# Patient Record
Sex: Female | Born: 1990 | Race: Black or African American | Hispanic: No | Marital: Single | Smoking: Former smoker
Health system: Southern US, Community
[De-identification: ages and names within clinical notes are randomized; demographics above are authoritative.]

## PROBLEM LIST (undated history)

## (undated) ENCOUNTER — Emergency Department (HOSPITAL_BASED_OUTPATIENT_CLINIC_OR_DEPARTMENT_OTHER): Payer: Medicaid Other | Source: Home / Self Care

## (undated) DIAGNOSIS — Z8759 Personal history of other complications of pregnancy, childbirth and the puerperium: Secondary | ICD-10-CM

## (undated) DIAGNOSIS — E119 Type 2 diabetes mellitus without complications: Secondary | ICD-10-CM

## (undated) DIAGNOSIS — D649 Anemia, unspecified: Secondary | ICD-10-CM

## (undated) DIAGNOSIS — Z9289 Personal history of other medical treatment: Secondary | ICD-10-CM

## (undated) DIAGNOSIS — K219 Gastro-esophageal reflux disease without esophagitis: Secondary | ICD-10-CM

## (undated) HISTORY — PX: LAPAROSCOPY FOR ECTOPIC PREGNANCY: SUR765

---

## 2012-04-22 ENCOUNTER — Encounter (HOSPITAL_COMMUNITY): Payer: Self-pay

## 2012-04-22 ENCOUNTER — Inpatient Hospital Stay (HOSPITAL_COMMUNITY)
Admission: AD | Admit: 2012-04-22 | Discharge: 2012-04-25 | DRG: 765 | Disposition: A | Discharge status: Home / Self Care | Payer: Medicaid Other | Source: Ambulatory Visit | Attending: Obstetrics & Gynecology | Admitting: Obstetrics & Gynecology

## 2012-04-22 ENCOUNTER — Inpatient Hospital Stay (HOSPITAL_COMMUNITY): Payer: Medicaid Other

## 2012-04-22 DIAGNOSIS — D509 Iron deficiency anemia, unspecified: Secondary | ICD-10-CM | POA: Diagnosis present

## 2012-04-22 DIAGNOSIS — O4290 Premature rupture of membranes, unspecified as to length of time between rupture and onset of labor, unspecified weeks of gestation: Secondary | ICD-10-CM | POA: Diagnosis present

## 2012-04-22 DIAGNOSIS — O411090 Infection of amniotic sac and membranes, unspecified, unspecified trimester, not applicable or unspecified: Secondary | ICD-10-CM | POA: Diagnosis present

## 2012-04-22 DIAGNOSIS — O34219 Maternal care for unspecified type scar from previous cesarean delivery: Secondary | ICD-10-CM | POA: Diagnosis present

## 2012-04-22 DIAGNOSIS — O0930 Supervision of pregnancy with insufficient antenatal care, unspecified trimester: Secondary | ICD-10-CM

## 2012-04-22 DIAGNOSIS — O9902 Anemia complicating childbirth: Secondary | ICD-10-CM | POA: Diagnosis present

## 2012-04-22 HISTORY — DX: Personal history of other medical treatment: Z92.89

## 2012-04-22 HISTORY — DX: Anemia, unspecified: D64.9

## 2012-04-22 HISTORY — DX: Personal history of other complications of pregnancy, childbirth and the puerperium: Z87.59

## 2012-04-22 LAB — FIBRINOGEN: Fibrinogen: 715 mg/dL — ABNORMAL HIGH (ref 204–475)

## 2012-04-22 LAB — CBC
Hemoglobin: 9.7 g/dL — ABNORMAL LOW (ref 12.0–15.0)
MCHC: 30.4 g/dL (ref 30.0–36.0)
Platelets: 161 10*3/uL (ref 150–400)
RBC: 4.23 MIL/uL (ref 3.87–5.11)

## 2012-04-22 LAB — DIFFERENTIAL
Basophils Relative: 0 % (ref 0–1)
Monocytes Relative: 9 % (ref 3–12)
Neutro Abs: 4.9 10*3/uL (ref 1.7–7.7)
Neutrophils Relative %: 66 % (ref 43–77)

## 2012-04-22 LAB — HEPATITIS B SURFACE ANTIGEN: Hepatitis B Surface Ag: NEGATIVE

## 2012-04-22 LAB — ABO/RH: ABO/RH(D): A POS

## 2012-04-22 LAB — RAPID HIV SCREEN (WH-MAU): Rapid HIV Screen: NONREACTIVE

## 2012-04-22 LAB — RPR: RPR Ser Ql: NONREACTIVE

## 2012-04-22 LAB — PROTIME-INR: Prothrombin Time: 13.3 seconds (ref 11.6–15.2)

## 2012-04-22 LAB — RUBELLA SCREEN: Rubella: 64.4 IU/mL — ABNORMAL HIGH

## 2012-04-22 MED ORDER — LACTATED RINGERS IV SOLN
500.0000 mL | INTRAVENOUS | Status: DC | PRN
  Administered 2012-04-23: 500 mL via INTRAVENOUS

## 2012-04-22 MED ORDER — OXYCODONE-ACETAMINOPHEN 5-325 MG PO TABS: 1.0000 | ORAL_TABLET | ORAL | Status: DC | PRN

## 2012-04-22 MED ORDER — ONDANSETRON HCL 4 MG/2ML IJ SOLN: 4.0000 mg | Freq: Four times a day (QID) | INTRAMUSCULAR | Status: DC | PRN

## 2012-04-22 MED ORDER — LACTATED RINGERS IV SOLN
500.0000 mL | Freq: Once | INTRAVENOUS | Status: AC
  Administered 2012-04-23: 500 mL via INTRAVENOUS

## 2012-04-22 MED ORDER — OXYTOCIN BOLUS FROM INFUSION
250.0000 mL | Freq: Once | INTRAVENOUS | Status: DC
  Filled 2012-04-22: qty 500

## 2012-04-22 MED ORDER — DIPHENHYDRAMINE HCL 50 MG/ML IJ SOLN: 12.5000 mg | INTRAMUSCULAR | Status: DC | PRN

## 2012-04-22 MED ORDER — ACETAMINOPHEN 325 MG PO TABS
650.0000 mg | ORAL_TABLET | ORAL | Status: DC | PRN
  Administered 2012-04-23: 975 mg via ORAL
  Filled 2012-04-22: qty 2
  Filled 2012-04-22: qty 1

## 2012-04-22 MED ORDER — EPHEDRINE 5 MG/ML INJ
10.0000 mg | INTRAVENOUS | Status: DC | PRN
  Filled 2012-04-22: qty 4

## 2012-04-22 MED ORDER — LIDOCAINE HCL (PF) 1 % IJ SOLN: 30.0000 mL | INTRAMUSCULAR | Status: DC | PRN

## 2012-04-22 MED ORDER — FLEET ENEMA 7-19 GM/118ML RE ENEM: 1.0000 | ENEMA | RECTAL | Status: DC | PRN

## 2012-04-22 MED ORDER — TERBUTALINE SULFATE 1 MG/ML IJ SOLN: 0.2500 mg | Freq: Once | INTRAMUSCULAR | Status: AC | PRN

## 2012-04-22 MED ORDER — FENTANYL CITRATE 0.05 MG/ML IJ SOLN
100.0000 ug | INTRAMUSCULAR | Status: DC | PRN
  Administered 2012-04-22 – 2012-04-23 (×3): 100 ug via INTRAVENOUS
  Filled 2012-04-22 (×3): qty 2

## 2012-04-22 MED ORDER — PHENYLEPHRINE 40 MCG/ML (10ML) SYRINGE FOR IV PUSH (FOR BLOOD PRESSURE SUPPORT): 80.0000 ug | PREFILLED_SYRINGE | INTRAVENOUS | Status: DC | PRN

## 2012-04-22 MED ORDER — EPHEDRINE 5 MG/ML INJ: 10.0000 mg | INTRAVENOUS | Status: DC | PRN

## 2012-04-22 MED ORDER — FENTANYL 2.5 MCG/ML BUPIVACAINE 1/10 % EPIDURAL INFUSION (WH - ANES)
14.0000 mL/h | INTRAMUSCULAR | Status: DC
  Administered 2012-04-23: 12 mL/h via EPIDURAL
  Administered 2012-04-23 (×2): 14 mL/h via EPIDURAL
  Filled 2012-04-22 (×3): qty 60

## 2012-04-22 MED ORDER — OXYTOCIN 40 UNITS IN LACTATED RINGERS INFUSION - SIMPLE MED
1.0000 m[IU]/min | INTRAVENOUS | Status: DC
  Administered 2012-04-22: 2 m[IU]/min via INTRAVENOUS
  Filled 2012-04-22: qty 1000

## 2012-04-22 MED ORDER — OXYTOCIN 40 UNITS IN LACTATED RINGERS INFUSION - SIMPLE MED: 62.5000 mL/h | Freq: Once | INTRAVENOUS | Status: DC

## 2012-04-22 MED ORDER — PHENYLEPHRINE 40 MCG/ML (10ML) SYRINGE FOR IV PUSH (FOR BLOOD PRESSURE SUPPORT)
80.0000 ug | PREFILLED_SYRINGE | INTRAVENOUS | Status: DC | PRN
  Filled 2012-04-22: qty 5

## 2012-04-22 MED ORDER — LACTATED RINGERS IV SOLN
INTRAVENOUS | Status: DC
  Administered 2012-04-22: 125 mL/h via INTRAVENOUS
  Administered 2012-04-23 (×2): via INTRAVENOUS

## 2012-04-22 MED ORDER — IBUPROFEN 600 MG PO TABS: 600.0000 mg | ORAL_TABLET | Freq: Four times a day (QID) | ORAL | Status: DC | PRN

## 2012-04-22 MED ORDER — CITRIC ACID-SODIUM CITRATE 334-500 MG/5ML PO SOLN
30.0000 mL | ORAL | Status: DC | PRN
  Administered 2012-04-23: 30 mL via ORAL
  Filled 2012-04-22: qty 15

## 2012-04-22 NOTE — MAU Note (Signed)
Patient arrived by EMS with complaints of leaking clear fluid starting about 0620. Has had one contractions. Patient has not had any prenatal care. Had a prior cesarean section with her first child for a breech presentation. Reports good fetal movement.

## 2012-04-22 NOTE — Progress Notes (Signed)
Sandra [name] is a 21 y.o. G3P1011 at [redacted]w[redacted]d admitted for rupture of membranes  Subjective: Contractions spaced out and weaker  Objective: BP 124/78  Pulse 105  Temp 98.6 F (37 C) (Oral)  Resp 20  Ht 4\' 11"  (1.499 m)  Wt 213 lb 6.4 oz (96.798 kg)  BMI 43.10 kg/m2  SpO2 100%  LMP 07/30/2011      FHT:  FHR: 145 bpm, variability: moderate,  accelerations:  Present,  decelerations:  Absent UC:   none SVE:   Dilation: 1 Effacement (%): 50 Station: -2 Exam by:: Dr. Elwyn Reach  Labs: Lab Results  Component Value Date   WBC 7.5 04/22/2012   HGB 9.7* 04/22/2012   HCT 31.9* 04/22/2012   MCV 75.4* 04/22/2012   PLT 161 04/22/2012    Assessment / Plan: PROM without labor  Labor: Foley bulb placed Fetal Wellbeing:  Category I Pain Control:  Labor support without medications and IV pain meds available I/D:  GBS unknown Anticipated MOD:  VBAC  BOOTH, [name] 04/22/2012, 2:55 PM

## 2012-04-22 NOTE — Progress Notes (Signed)
Subjective: Patient doing well. No complaints.  Objective: BP 130/58  Pulse 86  Temp 98.5 F (36.9 C) (Oral)  Resp 20  Ht 4\' 11"  (1.499 m)  Wt 96.798 kg (213 lb 6.4 oz)  BMI 43.10 kg/m2  SpO2 100%  LMP 07/30/2011      FHT:  FHR: 145 bpm, variability: moderate,  accelerations:  Present,  decelerations:  Absent UC:   irregular, every 2-6 minutes SVE:   Dilation: 6 Effacement (%): 70 Station: -3 Exam by:: l. leftwich-kirby, cnm  Labs: Lab Results  Component Value Date   WBC 7.5 04/22/2012   HGB 9.7* 04/22/2012   HCT 31.9* 04/22/2012   MCV 75.4* 04/22/2012   PLT 161 04/22/2012    Assessment / Plan: Foley bulb out.  Will start pitocin. Continue to support labor.  Marikay Alar 04/22/2012, 10:24 PM

## 2012-04-22 NOTE — MAU Note (Signed)
Dr. Elwyn Reach notified pt in MAU for c/o lof, clear, noted since 0630, intermittent contractions, efm tracing reactive. Will come see pt in MAU.

## 2012-04-22 NOTE — MAU Note (Signed)
Dr. Elwyn Reach at bedside.

## 2012-04-22 NOTE — MAU Note (Signed)
Pt states lof since 0620, clear fluid, no pnc here thus far. Prior c/s for breech delivery.

## 2012-04-22 NOTE — Progress Notes (Signed)
Sandra [name] is a 21 y.o. G3P1011 at [redacted]w[redacted]d admitted for rupture of membranes  Subjective: Uncomfortable with contractions  Objective: BP 117/61  Pulse 94  Temp 98.7 F (37.1 C) (Oral)  Resp 20  Ht 4\' 11"  (1.499 m)  Wt 213 lb 6.4 oz (96.798 kg)  BMI 43.10 kg/m2  SpO2 100%  LMP 07/30/2011      FHT:  FHR: 140 bpm, variability: moderate,  accelerations:  Present,  decelerations:  Present few variables UC:   regular, every 1-5 minutes SVE:   Dilation:  (unchanged) Effacement (%): 50 Station: -2 Exam by:: Dr. Elwyn Reach  Labs: Lab Results  Component Value Date   WBC 7.5 04/22/2012   HGB 9.7* 04/22/2012   HCT 31.9* 04/22/2012   MCV 75.4* 04/22/2012   PLT 161 04/22/2012    Assessment / Plan: PROM  Labor: Foley still in place; consider starting low dose pitocin if not out in the next few hours Fetal Wellbeing:  Category II Pain Control:  Labor support without medications and IV pain meds available I/D:  GBS unknown Anticipated MOD:  VBAC  BOOTH, Mckyle Solanki 04/22/2012, 6:47 PM

## 2012-04-22 NOTE — Progress Notes (Signed)
LYDIANN BONIFAS is a 21 y.o. G3P1011 at [redacted]w[redacted]d admitted for rupture of membranes  Subjective: Feeling contractions more.  Objective: BP 116/73  Pulse 100  Temp 99 F (37.2 C) (Oral)  Resp 18  Ht 4\' 11"  (1.499 m)  Wt 213 lb 6.4 oz (96.798 kg)  BMI 43.10 kg/m2  SpO2 100%  LMP 07/30/2011      FHT:  FHR: 140 bpm, variability: moderate,  accelerations:  Present,  decelerations:  Absent UC:   regular, every 8 minutes SVE:   Dilation: 1 Effacement (%): 50 Station: -2 Exam by:: Dr. Elwyn Reach  Labs: Lab Results  Component Value Date   WBC 7.5 04/22/2012   HGB 9.7* 04/22/2012   HCT 31.9* 04/22/2012   MCV 75.4* 04/22/2012   PLT 161 04/22/2012    Assessment / Plan: PROM - labor appears to be starting spontaneously  Labor: Progressing normally Fetal Wellbeing:  Category I Pain Control:  Labor support without medications and epidural when in active labor I/D:  GBS unknown; monitor for fever and prolonged rupture Anticipated MOD:  VBAC  BOOTH, Lashun Mccants 04/22/2012, 12:26 PM

## 2012-04-22 NOTE — H&P (Signed)
AANYA HAYNES is a 21 y.o. female G3P1011 at [redacted]w[redacted]d by LMP presenting for rupture of membranes at 6:30am.  No prenatal care.  Has been taking iron pill for history of iron deficiency and multivitamin (ran out a few days ago). Maternal Medical History:  Reason for admission: Reason for admission: rupture of membranes.  Reason for Admission:   nauseaContractions: Frequency: irregular.   Perceived severity is mild.    Fetal activity: Perceived fetal activity is normal.   Last perceived fetal movement was within the past hour.    Prenatal complications: Bleeding (spotting in first trimester).     OB History    Grav Para Term Preterm Abortions TAB SAB Ect Mult Living   3 1 1  1   1  1      Past Medical History  Diagnosis Date  . History of blood transfusion   . Hemorrhage after delivery of fetus     Intraoperative, received blood transfusion  . Anemia   . Hx of ectopic pregnancy    Past Surgical History  Procedure Date  . Cesarean section   . Laparoscopy for ectopic pregnancy    Family History: family history is negative for Other. Social History:  reports that she has never smoked. She has never used smokeless tobacco. She reports that she does not drink alcohol or use illicit drugs.   Prenatal Transfer Tool  Maternal Diabetes: no history of diabetes; not evaluated for gestational diabetes Genetic Screening: Declined Maternal Ultrasounds/Referrals: Declined Fetal Ultrasounds or other Referrals:  None Maternal Substance Abuse:  No Significant Maternal Medications:  None Significant Maternal Lab Results:  None Other Comments:  no prenatal care  Review of Systems  Constitutional: Negative for fever and chills.  Respiratory: Negative for cough and shortness of breath.   Cardiovascular: Negative for chest pain.  Gastrointestinal: Negative for nausea, vomiting and abdominal pain.  Neurological: Positive for tingling (right foot). Negative for dizziness and headaches.      Blood pressure 98/56, pulse 108, temperature 99.2 F (37.3 C), temperature source Oral, resp. rate 18, height 4\' 11"  (1.499 m), weight 213 lb 6.4 oz (96.798 kg), last menstrual period 07/30/2011, SpO2 100.00%. Maternal Exam:  Uterine Assessment: Contraction strength is mild.  Contraction frequency is irregular.   Abdomen: Patient reports no abdominal tenderness. Surgical scars: low transverse.   Fundal height is size appropriate for dates.   Estimated fetal weight is 7.5.   Fetal presentation: no presenting part  Introitus: Normal vulva. Normal vagina.  Ferning test: positive.  Amniotic fluid character: clear.  Pelvis: adequate for delivery.   Cervix: Cervix evaluated by digital exam.     Fetal Exam Fetal Monitor Review: Mode: ultrasound.   Baseline rate: 140.  Variability: moderate (6-25 bpm).   Pattern: accelerations present and no decelerations.    Fetal State Assessment: Category I - tracings are normal.     Physical Exam  Constitutional: She is oriented to person, place, and time. She appears well-developed and well-nourished. No distress.  HENT:  Head: Normocephalic and atraumatic.  Eyes: No scleral icterus.  Neck: Normal range of motion. Neck supple. No thyromegaly present.  Cardiovascular: Normal rate, regular rhythm and normal heart sounds.   No murmur heard. Respiratory: Effort normal and breath sounds normal. She has no wheezes. She has no rales.  GI:       Gravid  Genitourinary: Vagina normal.  Musculoskeletal: She exhibits no edema and no tenderness.  Lymphadenopathy:    She has no cervical adenopathy.  Neurological:  She is alert and oriented to person, place, and time. She exhibits normal muscle tone.  Skin: Skin is warm and dry. No rash noted. No erythema.  Psychiatric: She has a normal mood and affect. Her behavior is normal.    Prenatal labs (Collected 04/22/12): ABO, Rh:  A pos Antibody:  neg Rubella:  immune RPR:   neg HBsAg:   neg HIV:    nonreactive GBS:   unknown, collected 04/22/12  Assessment/Plan: 20 yo G3P1011 at [redacted]w[redacted]d by LMP with no prenatal care with SROM, confirmed by Crist Fat test - will draw prenatal labs - collected GBS and cervical cultures - will get ultrasound for presentation, placenta and EFW - once labs/US done will admit to L&D  - pt interested in VBAC if able - discussed risks including failure and uterine rupture - will monitor for contractions - options limited for augmentation given prior c-section  BOOTH, ERIN 04/22/2012, 9:10 AM

## 2012-04-23 ENCOUNTER — Encounter (HOSPITAL_COMMUNITY)
Admission: AD | Disposition: A | Discharge status: Home / Self Care | Payer: Self-pay | Source: Ambulatory Visit | Attending: Obstetrics & Gynecology

## 2012-04-23 ENCOUNTER — Encounter (HOSPITAL_COMMUNITY): Payer: Self-pay | Admitting: Anesthesiology

## 2012-04-23 ENCOUNTER — Encounter (HOSPITAL_COMMUNITY): Payer: Self-pay | Admitting: *Deleted

## 2012-04-23 ENCOUNTER — Inpatient Hospital Stay (HOSPITAL_COMMUNITY): Payer: Medicaid Other | Admitting: Anesthesiology

## 2012-04-23 DIAGNOSIS — O34219 Maternal care for unspecified type scar from previous cesarean delivery: Secondary | ICD-10-CM | POA: Diagnosis present

## 2012-04-23 DIAGNOSIS — O4290 Premature rupture of membranes, unspecified as to length of time between rupture and onset of labor, unspecified weeks of gestation: Secondary | ICD-10-CM

## 2012-04-23 DIAGNOSIS — O411090 Infection of amniotic sac and membranes, unspecified, unspecified trimester, not applicable or unspecified: Secondary | ICD-10-CM

## 2012-04-23 DIAGNOSIS — O0930 Supervision of pregnancy with insufficient antenatal care, unspecified trimester: Secondary | ICD-10-CM

## 2012-04-23 DIAGNOSIS — O9902 Anemia complicating childbirth: Secondary | ICD-10-CM

## 2012-04-23 LAB — URINALYSIS, ROUTINE W REFLEX MICROSCOPIC
Glucose, UA: NEGATIVE mg/dL
Leukocytes, UA: NEGATIVE
Specific Gravity, Urine: 1.02 (ref 1.005–1.030)
pH: 6 (ref 5.0–8.0)

## 2012-04-23 LAB — RAPID URINE DRUG SCREEN, HOSP PERFORMED: Opiates: NOT DETECTED

## 2012-04-23 LAB — GC/CHLAMYDIA PROBE AMP, GENITAL
Chlamydia, DNA Probe: NEGATIVE
GC Probe Amp, Genital: NEGATIVE

## 2012-04-23 LAB — URINE MICROSCOPIC-ADD ON

## 2012-04-23 SURGERY — Surgical Case
Anesthesia: Epidural | Site: Abdomen | Wound class: Clean Contaminated

## 2012-04-23 MED ORDER — METOCLOPRAMIDE HCL 5 MG/ML IJ SOLN: 10.0000 mg | Freq: Three times a day (TID) | INTRAMUSCULAR | Status: DC | PRN

## 2012-04-23 MED ORDER — PRENATAL MULTIVITAMIN CH
1.0000 | ORAL_TABLET | Freq: Every day | ORAL | Status: DC
  Administered 2012-04-24 – 2012-04-25 (×2): 1 via ORAL
  Filled 2012-04-23 (×2): qty 1

## 2012-04-23 MED ORDER — LANOLIN HYDROUS EX OINT: 1.0000 "application " | TOPICAL_OINTMENT | CUTANEOUS | Status: DC | PRN

## 2012-04-23 MED ORDER — DEXTROSE 5 % IV SOLN
1.0000 g | Freq: Three times a day (TID) | INTRAVENOUS | Status: AC
  Administered 2012-04-23 – 2012-04-24 (×3): 1 g via INTRAVENOUS
  Filled 2012-04-23 (×3): qty 1

## 2012-04-23 MED ORDER — FENTANYL CITRATE 0.05 MG/ML IJ SOLN
INTRAMUSCULAR | Status: AC
  Filled 2012-04-23: qty 2

## 2012-04-23 MED ORDER — OXYTOCIN 10 UNIT/ML IJ SOLN
40.0000 [IU] | INTRAVENOUS | Status: DC | PRN
  Administered 2012-04-23: 40 [IU] via INTRAVENOUS

## 2012-04-23 MED ORDER — ONDANSETRON HCL 4 MG PO TABS: 4.0000 mg | ORAL_TABLET | ORAL | Status: DC | PRN

## 2012-04-23 MED ORDER — DIPHENHYDRAMINE HCL 50 MG/ML IJ SOLN: 12.5000 mg | INTRAMUSCULAR | Status: DC | PRN

## 2012-04-23 MED ORDER — FENTANYL CITRATE 0.05 MG/ML IJ SOLN
25.0000 ug | INTRAMUSCULAR | Status: DC | PRN
  Administered 2012-04-23: 50 ug via INTRAVENOUS
  Administered 2012-04-23: 25 ug via INTRAVENOUS
  Administered 2012-04-23: 50 ug via INTRAVENOUS

## 2012-04-23 MED ORDER — GENTAMICIN SULFATE 40 MG/ML IJ SOLN
150.0000 mg | Freq: Once | INTRAVENOUS | Status: AC
  Administered 2012-04-23: 150 mg via INTRAVENOUS
  Filled 2012-04-23: qty 3.75

## 2012-04-23 MED ORDER — KETOROLAC TROMETHAMINE 30 MG/ML IJ SOLN
30.0000 mg | Freq: Four times a day (QID) | INTRAMUSCULAR | Status: AC | PRN
  Administered 2012-04-23: 30 mg via INTRAVENOUS
  Filled 2012-04-23: qty 1

## 2012-04-23 MED ORDER — GENTAMICIN SULFATE 40 MG/ML IJ SOLN
130.0000 mg | Freq: Three times a day (TID) | INTRAVENOUS | Status: DC
  Filled 2012-04-23 (×2): qty 3.25

## 2012-04-23 MED ORDER — MEPERIDINE HCL 25 MG/ML IJ SOLN: 6.2500 mg | INTRAMUSCULAR | Status: DC | PRN

## 2012-04-23 MED ORDER — OXYCODONE-ACETAMINOPHEN 5-325 MG PO TABS
1.0000 | ORAL_TABLET | ORAL | Status: DC | PRN
  Administered 2012-04-25 (×2): 1 via ORAL
  Filled 2012-04-23: qty 1
  Filled 2012-04-23: qty 2

## 2012-04-23 MED ORDER — SCOPOLAMINE 1 MG/3DAYS TD PT72
1.0000 | MEDICATED_PATCH | Freq: Once | TRANSDERMAL | Status: DC
  Administered 2012-04-23: 1.5 mg via TRANSDERMAL

## 2012-04-23 MED ORDER — IBUPROFEN 600 MG PO TABS
600.0000 mg | ORAL_TABLET | Freq: Four times a day (QID) | ORAL | Status: DC | PRN
  Filled 2012-04-23 (×4): qty 1

## 2012-04-23 MED ORDER — PENICILLIN G POTASSIUM 5000000 UNITS IJ SOLR
2.5000 10*6.[IU] | INTRAVENOUS | Status: DC
  Filled 2012-04-23 (×2): qty 2.5

## 2012-04-23 MED ORDER — SIMETHICONE 80 MG PO CHEW
80.0000 mg | CHEWABLE_TABLET | Freq: Three times a day (TID) | ORAL | Status: DC
  Administered 2012-04-23 – 2012-04-25 (×6): 80 mg via ORAL

## 2012-04-23 MED ORDER — SODIUM CHLORIDE 0.9 % IV SOLN
1.0000 ug/kg/h | INTRAVENOUS | Status: DC | PRN
  Filled 2012-04-23: qty 2.5

## 2012-04-23 MED ORDER — IBUPROFEN 600 MG PO TABS
600.0000 mg | ORAL_TABLET | Freq: Four times a day (QID) | ORAL | Status: DC
  Administered 2012-04-24 – 2012-04-25 (×7): 600 mg via ORAL
  Filled 2012-04-23 (×3): qty 1

## 2012-04-23 MED ORDER — OXYTOCIN 10 UNIT/ML IJ SOLN
INTRAMUSCULAR | Status: AC
  Filled 2012-04-23: qty 4

## 2012-04-23 MED ORDER — BUPIVACAINE-EPINEPHRINE (PF) 0.5% -1:200000 IJ SOLN
INTRAMUSCULAR | Status: AC
  Filled 2012-04-23: qty 10

## 2012-04-23 MED ORDER — SENNOSIDES-DOCUSATE SODIUM 8.6-50 MG PO TABS
2.0000 | ORAL_TABLET | Freq: Every day | ORAL | Status: DC
  Administered 2012-04-23: 2 via ORAL

## 2012-04-23 MED ORDER — SODIUM BICARBONATE 8.4 % IV SOLN
INTRAVENOUS | Status: DC | PRN
  Administered 2012-04-23: 3 mL via EPIDURAL
  Administered 2012-04-23: 7 mL via EPIDURAL

## 2012-04-23 MED ORDER — ONDANSETRON HCL 4 MG/2ML IJ SOLN: 4.0000 mg | INTRAMUSCULAR | Status: DC | PRN

## 2012-04-23 MED ORDER — ONDANSETRON HCL 4 MG/2ML IJ SOLN
INTRAMUSCULAR | Status: DC | PRN
  Administered 2012-04-23: 4 mg via INTRAVENOUS

## 2012-04-23 MED ORDER — ZOLPIDEM TARTRATE 5 MG PO TABS: 5.0000 mg | ORAL_TABLET | Freq: Every evening | ORAL | Status: DC | PRN

## 2012-04-23 MED ORDER — NALOXONE HCL 0.4 MG/ML IJ SOLN: 0.4000 mg | INTRAMUSCULAR | Status: DC | PRN

## 2012-04-23 MED ORDER — ONDANSETRON HCL 4 MG/2ML IJ SOLN
INTRAMUSCULAR | Status: AC
  Filled 2012-04-23: qty 2

## 2012-04-23 MED ORDER — FENTANYL CITRATE 0.05 MG/ML IJ SOLN
INTRAMUSCULAR | Status: DC | PRN
  Administered 2012-04-23: 25 ug via INTRAVENOUS
  Administered 2012-04-23: 50 ug via INTRAVENOUS
  Administered 2012-04-23: 25 ug via INTRAVENOUS

## 2012-04-23 MED ORDER — BUPIVACAINE HCL (PF) 0.25 % IJ SOLN
INTRAMUSCULAR | Status: AC
  Filled 2012-04-23: qty 30

## 2012-04-23 MED ORDER — HYDROMORPHONE HCL PF 1 MG/ML IJ SOLN: 1.0000 mg | Freq: Once | INTRAMUSCULAR | Status: DC

## 2012-04-23 MED ORDER — KETOROLAC TROMETHAMINE 30 MG/ML IJ SOLN: 30.0000 mg | Freq: Four times a day (QID) | INTRAMUSCULAR | Status: AC | PRN

## 2012-04-23 MED ORDER — LIDOCAINE HCL (PF) 1 % IJ SOLN
INTRAMUSCULAR | Status: DC | PRN
  Administered 2012-04-23 (×3): 4 mL

## 2012-04-23 MED ORDER — SCOPOLAMINE 1 MG/3DAYS TD PT72
MEDICATED_PATCH | TRANSDERMAL | Status: AC
  Administered 2012-04-23: 1.5 mg via TRANSDERMAL
  Filled 2012-04-23: qty 1

## 2012-04-23 MED ORDER — NALBUPHINE HCL 10 MG/ML IJ SOLN
5.0000 mg | INTRAMUSCULAR | Status: DC | PRN
  Filled 2012-04-23: qty 1

## 2012-04-23 MED ORDER — PENICILLIN G POTASSIUM 5000000 UNITS IJ SOLR
5.0000 10*6.[IU] | INTRAVENOUS | Status: DC
  Filled 2012-04-23: qty 5

## 2012-04-23 MED ORDER — LACTATED RINGERS IV SOLN
INTRAVENOUS | Status: DC | PRN
  Administered 2012-04-23 (×3): via INTRAVENOUS

## 2012-04-23 MED ORDER — ONDANSETRON HCL 4 MG/2ML IJ SOLN: 4.0000 mg | Freq: Three times a day (TID) | INTRAMUSCULAR | Status: DC | PRN

## 2012-04-23 MED ORDER — MORPHINE SULFATE 0.5 MG/ML IJ SOLN
INTRAMUSCULAR | Status: AC
  Filled 2012-04-23: qty 10

## 2012-04-23 MED ORDER — MORPHINE SULFATE (PF) 0.5 MG/ML IJ SOLN
INTRAMUSCULAR | Status: DC | PRN
Start: 2012-04-23 — End: 2012-04-23
  Administered 2012-04-23: 1 mg via INTRAVENOUS
  Administered 2012-04-23: 4 mg via EPIDURAL

## 2012-04-23 MED ORDER — WITCH HAZEL-GLYCERIN EX PADS: 1.0000 "application " | MEDICATED_PAD | CUTANEOUS | Status: DC | PRN

## 2012-04-23 MED ORDER — DIPHENHYDRAMINE HCL 25 MG PO CAPS: 25.0000 mg | ORAL_CAPSULE | Freq: Four times a day (QID) | ORAL | Status: DC | PRN

## 2012-04-23 MED ORDER — KETOROLAC TROMETHAMINE 60 MG/2ML IM SOLN
60.0000 mg | Freq: Once | INTRAMUSCULAR | Status: AC | PRN
  Filled 2012-04-23: qty 2

## 2012-04-23 MED ORDER — DIBUCAINE 1 % RE OINT: 1.0000 "application " | TOPICAL_OINTMENT | RECTAL | Status: DC | PRN

## 2012-04-23 MED ORDER — DIPHENHYDRAMINE HCL 25 MG PO CAPS: 25.0000 mg | ORAL_CAPSULE | ORAL | Status: DC | PRN

## 2012-04-23 MED ORDER — BUPIVACAINE HCL (PF) 0.25 % IJ SOLN
INTRAMUSCULAR | Status: DC | PRN
  Administered 2012-04-23: 30 mL

## 2012-04-23 MED ORDER — OXYTOCIN 40 UNITS IN LACTATED RINGERS INFUSION - SIMPLE MED: 62.5000 mL/h | INTRAVENOUS | Status: AC

## 2012-04-23 MED ORDER — MENTHOL 3 MG MT LOZG: 1.0000 | LOZENGE | OROMUCOSAL | Status: DC | PRN

## 2012-04-23 MED ORDER — DIPHENHYDRAMINE HCL 50 MG/ML IJ SOLN: 25.0000 mg | INTRAMUSCULAR | Status: DC | PRN

## 2012-04-23 MED ORDER — FENTANYL CITRATE 0.05 MG/ML IJ SOLN
INTRAMUSCULAR | Status: AC
  Administered 2012-04-23: 25 ug via INTRAVENOUS
  Filled 2012-04-23: qty 2

## 2012-04-23 MED ORDER — ACETAMINOPHEN 500 MG PO TABS: 1000.0000 mg | ORAL_TABLET | ORAL | Status: AC

## 2012-04-23 MED ORDER — SIMETHICONE 80 MG PO CHEW: 80.0000 mg | CHEWABLE_TABLET | ORAL | Status: DC | PRN

## 2012-04-23 MED ORDER — SODIUM CHLORIDE 0.9 % IV SOLN
2.0000 g | Freq: Four times a day (QID) | INTRAVENOUS | Status: DC
  Administered 2012-04-23 (×2): 2 g via INTRAVENOUS
  Filled 2012-04-23 (×4): qty 2000

## 2012-04-23 MED ORDER — 0.9 % SODIUM CHLORIDE (POUR BTL) OPTIME
TOPICAL | Status: DC | PRN
  Administered 2012-04-23: 1000 mL

## 2012-04-23 MED ORDER — TETANUS-DIPHTH-ACELL PERTUSSIS 5-2.5-18.5 LF-MCG/0.5 IM SUSP
0.5000 mL | Freq: Once | INTRAMUSCULAR | Status: AC
  Administered 2012-04-25: 0.5 mL via INTRAMUSCULAR
  Filled 2012-04-23: qty 0.5

## 2012-04-23 MED ORDER — SODIUM CHLORIDE 0.9 % IJ SOLN
3.0000 mL | INTRAMUSCULAR | Status: DC | PRN
  Administered 2012-04-24: 3 mL via INTRAVENOUS

## 2012-04-23 MED ORDER — LACTATED RINGERS IV SOLN
INTRAVENOUS | Status: DC
  Administered 2012-04-24: 07:00:00 via INTRAVENOUS

## 2012-04-23 SURGICAL SUPPLY — 31 items
CHLORAPREP W/TINT 26ML (MISCELLANEOUS) ×2 IMPLANT
CLOTH BEACON ORANGE TIMEOUT ST (SAFETY) ×2 IMPLANT
DRESSING TELFA 8X3 (GAUZE/BANDAGES/DRESSINGS) ×2 IMPLANT
ELECT REM PT RETURN 9FT ADLT (ELECTROSURGICAL) ×2
ELECTRODE REM PT RTRN 9FT ADLT (ELECTROSURGICAL) ×1 IMPLANT
EXTRACTOR VACUUM M CUP 4 TUBE (SUCTIONS) IMPLANT
GAUZE SPONGE 4X4 12PLY STRL LF (GAUZE/BANDAGES/DRESSINGS) ×2 IMPLANT
GLOVE BIOGEL PI IND STRL 7.0 (GLOVE) ×1 IMPLANT
GLOVE BIOGEL PI INDICATOR 7.0 (GLOVE) ×1
GLOVE ECLIPSE 7.0 STRL STRAW (GLOVE) ×4 IMPLANT
GLOVE INDICATOR 7.0 STRL GRN (GLOVE) ×2 IMPLANT
GLOVE NEODERM STER SZ 7 (GLOVE) ×4 IMPLANT
GOWN PREVENTION PLUS LG XLONG (DISPOSABLE) ×4 IMPLANT
GOWN PREVENTION PLUS XLARGE (GOWN DISPOSABLE) ×2 IMPLANT
KIT ABG SYR 3ML LUER SLIP (SYRINGE) IMPLANT
NEEDLE HYPO 22GX1.5 SAFETY (NEEDLE) ×2 IMPLANT
NEEDLE HYPO 25X5/8 SAFETYGLIDE (NEEDLE) IMPLANT
NS IRRIG 1000ML POUR BTL (IV SOLUTION) ×2 IMPLANT
PACK C SECTION WH (CUSTOM PROCEDURE TRAY) ×2 IMPLANT
PAD ABD 7.5X8 STRL (GAUZE/BANDAGES/DRESSINGS) ×2 IMPLANT
RTRCTR C-SECT PINK 25CM LRG (MISCELLANEOUS) ×2 IMPLANT
SLEEVE SCD COMPRESS KNEE MED (MISCELLANEOUS) ×2 IMPLANT
STAPLER VISISTAT 35W (STAPLE) IMPLANT
SUT VIC AB 0 CTX 36 (SUTURE) ×3
SUT VIC AB 0 CTX36XBRD ANBCTRL (SUTURE) ×3 IMPLANT
SUT VIC AB 4-0 KS 27 (SUTURE) ×2 IMPLANT
SYR 30ML LL (SYRINGE) ×2 IMPLANT
TAPE CLOTH SURG 4X10 WHT LF (GAUZE/BANDAGES/DRESSINGS) ×2 IMPLANT
TOWEL OR 17X24 6PK STRL BLUE (TOWEL DISPOSABLE) ×4 IMPLANT
TRAY FOLEY CATH 14FR (SET/KITS/TRAYS/PACK) ×2 IMPLANT
WATER STERILE IRR 1000ML POUR (IV SOLUTION) ×2 IMPLANT

## 2012-04-23 NOTE — Progress Notes (Signed)
Sandra [name] is a 21 y.o. G3P1011 at [redacted]w[redacted]d admitted for rupture of membranes  Subjective: Pt breathing with contractions, has questions about epidural at this time.    Objective: BP 124/66  Pulse 82  Temp 99.1 F (37.3 C) (Oral)  Resp 20  Ht 4\' 11"  (1.499 m)  Wt 96.798 kg (213 lb 6.4 oz)  BMI 43.10 kg/m2  SpO2 100%  LMP 07/30/2011      FHT:  FHR: 135 bpm, variability: moderate,  accelerations:  Present,  decelerations:  Absent UC:   regular, every 2-3 minutes SVE:   Dilation: 6 Effacement (%): 80 Station: -2 Exam by:: Sandra [name], cnm IUPC placed at this time without difficulty, pt tolerated well  Labs: Lab Results  Component Value Date   WBC 7.5 04/22/2012   HGB 9.7* 04/22/2012   HCT 31.9* 04/22/2012   MCV 75.4* 04/22/2012   PLT 161 04/22/2012    Assessment / Plan: Augmentation of labor, progressing well  Labor: Continue to increase Pitocin Preeclampsia:  N/A Fetal Wellbeing:  Category I Pain Control:  Epidural I/D:  n/a Anticipated MOD:  VBAC  [name], Sandra [name] 04/23/2012, 3:27 AM

## 2012-04-23 NOTE — Progress Notes (Signed)
Sandra [name] is a 21 y.o. G3P1011 at [redacted]w[redacted]d admitted for rupture of membranes  Subjective: Complaining of some stomach pain/pressure.  Frustrated with lack of progress.  Objective: BP 117/54  Pulse 119  Temp 100.8 F (38.2 C) (Oral)  Resp 20  Ht 4\' 11"  (1.499 m)  Wt 213 lb 6.4 oz (96.798 kg)  BMI 43.10 kg/m2  SpO2 99%  LMP 07/30/2011      FHT:  FHR: 155 bpm, variability: moderate,  accelerations:  Present,  decelerations:  Absent UC:   regular, every 2-5 minutes SVE:   Dilation: 6 Effacement (%): 90 Station: -1 Exam by:: Dr. Elwyn Reach  Labs: Lab Results  Component Value Date   WBC 7.5 04/22/2012   HGB 9.7* 04/22/2012   HCT 31.9* 04/22/2012   MCV 75.4* 04/22/2012   PLT 161 04/22/2012    Assessment / Plan: Induction of labor due to PROM,  progressing well on pitocin  Labor: contractions adequate for last hour; will recheck in 2 hours, if no change patient would like section Fetal Wellbeing:  Category I Pain Control:  Epidural I/D:  maternal fever; on amp and gent Anticipated MOD:  VBAC vs repeat section  BOOTH, Chaneka Trefz 04/23/2012, 9:44 AM

## 2012-04-23 NOTE — Interval H&P Note (Signed)
History and Physical Interval Note:  04/23/2012 2:57 PM  Sandra [name]  has presented today for surgery, with the diagnosis of elective repeat.  Pt. Initially elected for TOLAC, but after several hours without any significant change past 6 cm, she wanted RCS.  She also developed chorioamnionitis.  The various methods of treatment have been discussed with the patient and family. After consideration of risks, benefits and other options for treatment, the patient has consented to  Procedure(s) (LRB): CESAREAN SECTION (N/A) as a surgical intervention .  The patient's history has been reviewed, patient examined, no change in status, stable for surgery.  I have reviewed the patient's chart and labs.  Questions were answered to the patient's satisfaction.     Marializ Ferrebee S

## 2012-04-23 NOTE — Op Note (Signed)
Preoperative Diagnosis:  IUP @ [redacted]w[redacted]d, previous c-section, attempted VBAC, chorioamnionitis, limited prenatal care  Postoperative Diagnosis:  Same  Procedure: Repeat low transverse cesarean section  Surgeon: Tinnie Gens, M.D.  Assistant: Napoleon Form, MD  Findings: Viable female infant, APGAR (1 MIN): 9   APGAR (5 MINS): 9   APGAR (10 MINS):   Weight pending Vertex presentation, LOA position  Estimated blood loss: 1000 cc  Complications: None known  Specimens: Placenta to labor and delivery  Reason for procedure: Briefly, the patient is a 21 y.o. [id number] [redacted]w[redacted]d who presents for premature rupture of membranes with previous c-section, elected TOLAC. She had a foley bulb placed which came out at 6 cm. Despite pitocin she failed to make further changes. She became febrile and was started on antibiotics. After several hours without change she opted for elective c-section. Risks and benefits were discussed.  Procedure: Patient is a to the OR where spinal analgesia was administered. She was then placed in a supine position with left lateral tilt. Since she was already receiving antibiotics, no Ancef was given.  SCDs were in place. A timeout was performed. She was prepped and draped in the usual sterile fashion. A Foley catheter was placed in the bladder. A knife was then used to make a Pfannenstiel incision. This incision was carried out to underlying fascia which was divided in the midline with the knife. The incision was extended laterally, sharply.  The fascia was dissected off the underlying rectus muscle superiorly and inferiorly. The superior portion of the fascia was markedly adherent to the rectus. The rectus was divided in the midline. The posterior sheath was very thickened and adherent to the rectus and had to be divided. The peritoneal cavity was entered bluntly. In order to make more room, the rectus and posterior sheath were divided with electrocautery to the patient's left side.   Alexis retractor was placed inside the incision.  A knife was used to make a low transverse incision on the uterus. This incision was carried down to the amniotic cavity was entered. Fetus was in LOA position and was brought up out of the incision without difficulty. Cord was clamped x 2 and cut. Infant taken to waiting pediatrician.  Cord blood was obtained. Placenta was delivered from the uterus.  Uterus was cleaned with dry lap pads. Uterine incision closed with 0 Vicryl suture in a locked running fashion. Excellent hemostasis was noted.  Alexis retractor was removed from the abdomen. Peritoneal closure was done with 0 Vicryl suture.  Fascia is closed with 0 Vicryl suture in a running fashion. Subcutaneous tissue infused with 30cc 0.25% Marcaine.  Skin closed using 3-0 Vicryl on a Keith needle.  Steri strips applied, followed by pressure dressing.  All instrument, needle and lap counts were correct x 2.  Patient was awake and taken to PACU stable.  Infant to Newborn Nursery, stable.

## 2012-04-23 NOTE — Anesthesia Preprocedure Evaluation (Addendum)
Anesthesia Evaluation  Patient identified by MRN, date of birth, ID band Patient awake    Reviewed: Allergy & Precautions, H&P , NPO status , Patient's Chart, lab work & pertinent test results, reviewed documented beta blocker date and time   History of Anesthesia Complications Negative for: history of anesthetic complications  Airway Mallampati: III TM Distance: >3 FB Neck ROM: full    Dental  (+) Teeth Intact   Pulmonary neg pulmonary ROS,  breath sounds clear to auscultation        Cardiovascular negative cardio ROS  Rhythm:regular Rate:Normal     Neuro/Psych negative neurological ROS  negative psych ROS   GI/Hepatic negative GI ROS, Neg liver ROS,   Endo/Other  Morbid obesity  Renal/GU negative Renal ROS  negative genitourinary   Musculoskeletal   Abdominal   Peds  Hematology  (+) anemia ,   Anesthesia Other Findings Anaphylactic shellfish allergy  Reproductive/Obstetrics (+) Pregnancy (h/o c/s x1, attempting VBAC.  No prenatal care.  History of hemorrhage after c/s)                           Anesthesia Physical Anesthesia Plan  ASA: III and Emergent  Anesthesia Plan: Epidural   Post-op Pain Management:    Induction:   Airway Management Planned: Natural Airway  Additional Equipment:   Intra-op Plan:   Post-operative Plan:   Informed Consent: I have reviewed the patients History and Physical, chart, labs and discussed the procedure including the risks, benefits and alternatives for the proposed anesthesia with the patient or authorized representative who has indicated his/her understanding and acceptance.     Plan Discussed with: Anesthesiologist, CRNA and Surgeon  Anesthesia Plan Comments:        Anesthesia Quick Evaluation

## 2012-04-23 NOTE — Transfer of Care (Signed)
Immediate Anesthesia Transfer of Care Note  Patient: Sandra [name]  Procedure(s) Performed: Procedure(s) (LRB): CESAREAN SECTION (N/A)  Patient Location: PACU  Anesthesia Type: Epidural  Level of Consciousness: awake, alert  and oriented  Airway & Oxygen Therapy: Patient Spontanous Breathing  Post-op Assessment: Report given to PACU RN and Post -op Vital signs reviewed and stable  Post vital signs: Reviewed and stable  Complications: No apparent anesthesia complications

## 2012-04-23 NOTE — Anesthesia Postprocedure Evaluation (Signed)
  Anesthesia Post-op Note  Patient: Sandra [name]  Procedure(s) Performed: Procedure(s) (LRB): CESAREAN SECTION (N/A)  Patient Location: PACU  Anesthesia Type: Epidural  Level of Consciousness: awake, alert  and oriented  Airway and Oxygen Therapy: Patient Spontanous Breathing  Post-op Pain: none  Post-op Assessment: Post-op Vital signs reviewed, Patient's Cardiovascular Status Stable, Respiratory Function Stable, Patent Airway, No signs of Nausea or vomiting, Pain level controlled, No headache and No backache  Post-op Vital Signs: Reviewed and stable  Complications: No apparent anesthesia complications

## 2012-04-23 NOTE — Progress Notes (Signed)
Sandra [name] is a 21 y.o. G3P1011 at [redacted]w[redacted]d admitted for rupture of membranes  Subjective: Tired on labor; wants section if no change.  Objective: BP 121/47  Pulse 99  Temp 100.2 F (37.9 C) (Oral)  Resp 20  Ht 4\' 11"  (1.499 m)  Wt 213 lb 6.4 oz (96.798 kg)  BMI 43.10 kg/m2  SpO2 99%  LMP 07/30/2011      FHT:  FHR: 150 bpm, variability: moderate,  accelerations:  Abscent,  decelerations:  Absent UC:   regular, every 3-5 minutes SVE:   Dilation: 6 Effacement (%): 90 Station: -1 Exam by:: Dr. Elwyn Reach  Labs: Lab Results  Component Value Date   WBC 7.5 04/22/2012   HGB 9.7* 04/22/2012   HCT 31.9* 04/22/2012   MCV 75.4* 04/22/2012   PLT 161 04/22/2012    Assessment / Plan: Prolonged active phase in PROM and prior section  Labor: Patient requesting repeat section as no change; will stop pitocin Fetal Wellbeing:  Category I; has been having variables and lates, looking better last 30 minutes Pain Control:  Epidural I/D:  Amp and Gent for prolonged rupture of membranes and maternal fever Anticipated MOD:  repeat c-section  [name], Sandra Labriola 04/23/2012, 11:37 AM

## 2012-04-23 NOTE — Progress Notes (Signed)
SHAMIRACLE GORDEN is a 21 y.o. G3P1011 at [redacted]w[redacted]d admitted for rupture of membranes.  Pt has hx of previous C/S for breech position.  Subjective: Pt sleeping on and off, denies pain with contractions at this time without pain medication.   Objective: BP 121/66  Pulse 80  Temp 98.8 F (37.1 C) (Oral)  Resp 18  Ht 4\' 11"  (1.499 m)  Wt 96.798 kg (213 lb 6.4 oz)  BMI 43.10 kg/m2  SpO2 100%  LMP 07/30/2011      FHT:  FHR: 135 bpm, variability: moderate,  accelerations:  Present,  decelerations:  Absent UC:   irregular, every 2-10 minutes SVE:   Deferred  Labs: Lab Results  Component Value Date   WBC 7.5 04/22/2012   HGB 9.7* 04/22/2012   HCT 31.9* 04/22/2012   MCV 75.4* 04/22/2012   PLT 161 04/22/2012    Assessment / Plan: Augmentation of labor, progressing well S/P Foley bulb  Labor: Continue to increase Pitocin Preeclampsia:  N/A Fetal Wellbeing:  Category I Pain Control:  Epidural I/D:  n/a Anticipated MOD:  NSVD  LEFTWICH-KIRBY, Carleta Woodrow 04/23/2012, 1:20 AM

## 2012-04-23 NOTE — Progress Notes (Signed)
Patient's pain 7-8 on a scale of 1-10.  Notified Dr. Malen Gauze since toradol was held in PACU.  Dr. Malen Gauze ordered one dose of 30mg  IV toradol now and if that dose ineffective, may give 0.5-1mg  IV dilaudid x1 dose.  Will continue to monitor closely.  Earl Gala, Linda Hedges [place]

## 2012-04-23 NOTE — Anesthesia Procedure Notes (Signed)
Epidural Patient location during procedure: OB Start time: 04/23/2012 3:54 AM Reason for block: procedure for pain  Staffing Performed by: anesthesiologist   Preanesthetic Checklist Completed: patient identified, site marked, surgical consent, pre-op evaluation, timeout performed, IV checked, risks and benefits discussed and monitors and equipment checked  Epidural Patient position: sitting Prep: site prepped and draped and DuraPrep Patient monitoring: continuous pulse ox and blood pressure Approach: midline Injection technique: LOR air  Needle:  Needle type: Tuohy  Needle gauge: 17 G Needle length: 9 cm Needle insertion depth: 7 cm Catheter type: closed end flexible Catheter size: 19 Gauge Catheter at skin depth: 12 cm Test dose: negative  Assessment Events: blood not aspirated, injection not painful, no injection resistance, negative IV test and no paresthesia  Additional Notes Discussed risk of headache, infection, bleeding, nerve injury and failed or incomplete block.  Patient voices understanding and wishes to proceed.

## 2012-04-23 NOTE — Progress Notes (Signed)
[name] [name] is a 21 y.o. G3P1011 at [redacted]w[redacted]d admitted for rupture of membranes, TOLAC, previous C/S for breech.  Subjective: Pt comfortable with epidural.  Objective: BP 107/53  Pulse 109  Temp 100.1 F (37.8 C) (Oral)  Resp 20  Ht 4\' 11"  (1.499 m)  Wt 96.798 kg (213 lb 6.4 oz)  BMI 43.10 kg/m2  SpO2 99%  LMP 07/30/2011      FHT:  FHR: 155 bpm, variability: moderate,  accelerations:  Present,  decelerations:  Absent UC:   irregular, every 2-6 minutes SVE:   Dilation: 6 Effacement (%): 90 Station: -1 Exam by:: [name] [name], CNM  Labs: Lab Results  Component Value Date   WBC 7.5 04/22/2012   HGB 9.7* 04/22/2012   HCT 31.9* 04/22/2012   MCV 75.4* 04/22/2012   PLT 161 04/22/2012    Assessment / Plan: Augmentation of labor, slow progression since foley bulb out Inadequate contractions with MVUs less than 200  Plan to recheck temp in 30 minutes  Labor: see above Preeclampsia:  N/A Fetal Wellbeing:  Category I Pain Control:  Epidural I/D:  n/a Anticipated MOD:  VBAC  [name] [name] Kurt 04/23/2012, 7:36 AM

## 2012-04-23 NOTE — Progress Notes (Signed)
ANTIBIOTIC CONSULT NOTE - INITIAL  Pharmacy Consult for Gentamicin Indication: Chorioamnionitis  Patient Measurements: Height: 4\' 11"  (149.9 cm) Weight: 213 lb 6.4 oz (96.798 kg) IBW/kg (Calculated) : 43.2  Adjusted Body Weight: 59.3  Vital Signs: Temp: 102.4 F (39.1 C) (08/06 0801) Temp src: Oral (08/06 0801) BP: 133/66 mmHg (08/06 0801) Pulse Rate: 102  (08/06 0801)  Labs:  Basename 04/22/12 0910  WBC 7.5  HGB 9.7*  PLT 161  LABCREA --  CREATININE --  CRCLEARANCE --   Microbiology: No results found for this or any previous visit (from the past 720 hour(s)).  Medications:  Ampicillin 2g IV q6hr  Assessment: 21 y.o. female G3P1011 at [redacted]w[redacted]d with ROM and TOLAC, now with increase tempature, started on Amp/Gent to cover chorioamnionitis. Estimated Ke = 0.260, Vd = 0.35L/kg  Goal of Therapy:  Gentamicin peak 6-8 mg/L and Trough < 1 mg/L  Plan:  Gentamicin 150 mg IV x 1  Gentamicin 130 mg IV every 8 hrs  Check Scr with next labs if gentamicin continued. Will check gentamicin levels if continued > 72hr or clinically indicated.  Michelene Heady Braxton 04/23/2012,8:25 AM

## 2012-04-24 LAB — CBC
HCT: 27.8 % — ABNORMAL LOW (ref 36.0–46.0)
Hemoglobin: 8.6 g/dL — ABNORMAL LOW (ref 12.0–15.0)
MCV: 75.1 fL — ABNORMAL LOW (ref 78.0–100.0)
WBC: 19.3 10*3/uL — ABNORMAL HIGH (ref 4.0–10.5)

## 2012-04-24 LAB — CULTURE, BETA STREP (GROUP B ONLY)

## 2012-04-24 NOTE — Progress Notes (Signed)
Ur chart review completed.  

## 2012-04-24 NOTE — Progress Notes (Signed)
I have seen/examined this patient and agree with the above.  Shrihaan Porzio E.  

## 2012-04-24 NOTE — Progress Notes (Signed)
Subjective: Postpartum Day 1: Cesarean Delivery Patient reports tolerating PO and + flatus.  Foley just removed, not spontaneously voided yet.  No BM yet.  Reports pain and bleeding are improving.  OCPs for birth control until she can get a BTL.  Objective: Vital signs in last 24 hours: Temp:  [97.4 F (36.3 C)-102.4 F (39.1 C)] 98.3 F (36.8 C) (08/07 0300) Pulse Rate:  [76-119] 76  (08/07 0300) Resp:  [15-20] 20  (08/07 0300) BP: (88-133)/(36-81) 88/49 mmHg (08/07 0300) SpO2:  [81 %-99 %] 99 % (08/07 0300)  Physical Exam:  General: alert, cooperative, appears stated age and no distress Lochia: appropriate Uterine Fundus: firm Incision: bandage c/d/i DVT Evaluation: No evidence of DVT seen on physical exam. No cords or calf tenderness. No significant calf/ankle edema.   Basename 04/24/12 0518 04/22/12 0910  HGB 8.6* 9.7*  HCT 27.8* 31.9*    Assessment/Plan: Status post Cesarean section. Doing well postoperatively.  Continue current care.  BOOTH, Lukus Binion 04/24/2012, 7:45 AM

## 2012-04-24 NOTE — Anesthesia Postprocedure Evaluation (Signed)
  Anesthesia Post Note  Patient: Sandra [name]  Procedure(s) Performed: Procedure(s) (LRB): CESAREAN SECTION (N/A)  Anesthesia type: Spinal  Patient location: Mother/Baby  Post pain: Pain level controlled  Post assessment: Post-op Vital signs reviewed  Last Vitals:  Filed Vitals:   04/24/12 0300  BP: 88/49  Pulse: 76  Temp: 36.8 C  Resp: 20    Post vital signs: Reviewed  Level of consciousness: awake  Complications: No apparent anesthesia complications

## 2012-04-24 NOTE — Anesthesia Postprocedure Evaluation (Signed)
  Anesthesia Post-op Note  Patient: Sandra [name]  Procedure(s) Performed: Procedure(s) (LRB): CESAREAN SECTION (N/A)  Patient Location: PACU and Mother/Baby  Anesthesia Type: Epidural  Level of Consciousness: awake, alert  and oriented  Airway and Oxygen Therapy: Patient Spontanous Breathing  Post-op Pain: none  Post-op Assessment: Post-op Vital signs reviewed  Post-op Vital Signs: Reviewed and stable  Complications: No apparent anesthesia complications

## 2012-04-24 NOTE — Addendum Note (Signed)
Addendum  created 04/24/12 0810 by Algis Greenhouse, CRNA   Modules edited:Notes Section

## 2012-04-24 NOTE — Addendum Note (Signed)
Addendum  created 04/24/12 0822 by Ameia Morency M Grayer Sproles, RN   Modules edited:Charges VN, Notes Section    

## 2012-04-25 ENCOUNTER — Encounter (HOSPITAL_COMMUNITY): Payer: Self-pay | Admitting: Family Medicine

## 2012-04-25 MED ORDER — PRENATAL MULTIVITAMIN CH: 1.0000 | ORAL_TABLET | Freq: Every day | ORAL | Status: DC

## 2012-04-25 MED ORDER — IBUPROFEN 600 MG PO TABS: 600.0000 mg | ORAL_TABLET | Freq: Four times a day (QID) | ORAL | Status: AC
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
## 2012-04-25 NOTE — Discharge Summary (Signed)
Seen and agree with note. 

## 2012-04-25 NOTE — Discharge Summary (Addendum)
Obstetric Discharge Summary Reason for Admission: onset of labor Prenatal Procedures: none and no prenatal care Intrapartum Procedures: cesarean: low cervical, transverse, GBS prophylaxis and failed TOLAC Postpartum Procedures: antibiotics for 24 hours for chorio Complications-Operative and Postpartum: none Hemoglobin  Date Value Range Status  04/24/2012 8.6* 12.0 - 15.0 g/dL Final     HCT  Date Value Range Status  04/24/2012 27.8* 36.0 - 46.0 % Final   Doing well. Pain improved.  +BM, voiding.  Bottle feeding.  Wants BTL.  Physical Exam:  General: alert, cooperative, appears stated age and no distress Lochia: appropriate Uterine Fundus: firm Incision: healing well, no significant drainage, no dehiscence, no significant erythema DVT Evaluation: No evidence of DVT seen on physical exam. No cords or calf tenderness. No significant calf/ankle edema.  Discharge Diagnoses: Term Pregnancy-delivered                                         Anemia without hemodynamic instability  Discharge Information: Date: 04/25/2012 Activity: unrestricted and pelvic rest Diet: routine Medications: PNV and Ibuprofen Condition: stable Instructions: refer to practice specific booklet Discharge to: home  Seen and Agree with note.   Newborn Data: Live born female  Birth Weight: 7 lb 14.5 oz (3586 g) APGAR: 9, 9  Home with mother.  BOOTH, ERIN 04/25/2012, 7:31 AM

## 2012-04-25 NOTE — Plan of Care (Signed)
Problem: Phase II Progression Outcomes Goal: Incision intact & without signs/symptoms of infection Outcome: Completed/Met Date Met:  04/25/12 Steri strips clean dry intact.

## 2012-04-26 ENCOUNTER — Encounter: Payer: Self-pay | Admitting: Advanced Practice Midwife

## 2012-04-26 LAB — TYPE AND SCREEN
ABO/RH(D): A POS
Antibody Screen: NEGATIVE
Unit division: 0

## 2012-05-13 DIAGNOSIS — O0990 Supervision of high risk pregnancy, unspecified, unspecified trimester: Secondary | ICD-10-CM | POA: Insufficient documentation

## 2012-05-22 ENCOUNTER — Ambulatory Visit: Payer: Self-pay | Admitting: Advanced Practice Midwife

## 2012-06-02 ENCOUNTER — Inpatient Hospital Stay (HOSPITAL_COMMUNITY): Payer: Medicaid Other

## 2012-06-02 ENCOUNTER — Encounter (HOSPITAL_COMMUNITY): Payer: Self-pay | Admitting: *Deleted

## 2012-06-02 ENCOUNTER — Inpatient Hospital Stay (HOSPITAL_COMMUNITY)
Admission: AD | Admit: 2012-06-02 | Discharge: 2012-06-02 | Disposition: A | Discharge status: Home / Self Care | Payer: Medicaid Other | Source: Ambulatory Visit | Attending: Obstetrics & Gynecology | Admitting: Obstetrics & Gynecology

## 2012-06-02 DIAGNOSIS — N920 Excessive and frequent menstruation with regular cycle: Secondary | ICD-10-CM | POA: Insufficient documentation

## 2012-06-02 LAB — CBC
Platelets: 274 10*3/uL (ref 150–400)
RDW: 18.3 % — ABNORMAL HIGH (ref 11.5–15.5)
WBC: 6 10*3/uL (ref 4.0–10.5)

## 2012-06-02 LAB — POCT PREGNANCY, URINE: Preg Test, Ur: NEGATIVE

## 2012-06-02 NOTE — MAU Provider Note (Signed)
Chief Complaint: Vaginal Bleeding   None    SUBJECTIVE HPI: Sandra [name] is a 21 y.o. [id number] who presents to maternity admissions reporting heavy vaginal bleeding with clots x3-4 days.  She is 6 weeks post cesarean section at term and lochia stopped 3 weeks ago, then spotting started 1 week ago, and became heavier 3-4 days ago.  She reports changing 1 pad/hour for the last 24 hours.  She reports feeling dizzy and fatigued with the bleeding.  She denies abdominal pain, n/v, or fever/chills.     Past Medical History  Diagnosis Date  . History of blood transfusion   . Hemorrhage after delivery of fetus     Intraoperative, received blood transfusion  . Anemia   . Hx of ectopic pregnancy    Past Surgical History  Procedure Date  . Cesarean section   . Laparoscopy for ectopic pregnancy   . Cesarean section 04/23/2012    Procedure: CESAREAN SECTION;  Surgeon: Reva Bores, MD;  Location: [facility];  Service: Gynecology;  Laterality: N/A;   History   Social History  . Marital Status: Single    Spouse Name: N/A    Number of Children: N/A  . Years of Education: N/A   Occupational History  . Not on file.   Social History Main Topics  . Smoking status: Never Smoker   . Smokeless tobacco: Never Used  . Alcohol Use: No  . Drug Use: No  . Sexually Active: Yes    Birth Control/ Protection: None   Other Topics Concern  . Not on file   Social History Narrative  . No narrative on file   No current facility-administered medications on file prior to encounter.   Current Outpatient Prescriptions on File Prior to Encounter  Medication Sig Dispense Refill  . Ferrous Sulfate 28 MG TABS Take 2 tablets by mouth daily.      . Prenatal Vit-Fe Fumarate-FA (PRENATAL MULTIVITAMIN) TABS Take 1 tablet by mouth daily.  30 tablet  3   Allergies  Allergen Reactions  . Shellfish Allergy Anaphylaxis    ROS: Pertinent items in HPI  OBJECTIVE Blood pressure 137/66, pulse 95, temperature 98.4  F (36.9 C), temperature source Oral, resp. rate 16, height 4\' 11"  (1.499 m), weight 84.369 kg (186 lb), SpO2 100.00%. GENERAL: Well-developed, well-nourished female in no acute distress.  HEENT: Normocephalic HEART: normal rate RESP: normal effort ABDOMEN: Soft, non-tender EXTREMITIES: Nontender, no edema NEURO: Alert and oriented Pelvic exam: Cervix pink, visually closed, without lesion, large amount pooling of bright red blood with golf-ball sized clots x2 removed from vagina, vaginal walls and external genitalia normal Bimanual exam: Cervix 0/long/high, firm, anterior, neg CMT, uterus tender, ~9 week size, boggy, adnexa without tenderness, enlargement, or mass  LAB RESULTS Results for orders placed during the hospital encounter of 06/02/12 (from the past 24 hour(s))  POCT PREGNANCY, URINE     Status: Normal   Collection Time   06/02/12  7:55 PM      Component Value Range   Preg Test, Ur NEGATIVE  NEGATIVE  CBC     Status: Abnormal   Collection Time   06/02/12  7:58 PM      Component Value Range   WBC 6.0  4.0 - 10.5 K/uL   RBC 4.25  3.87 - 5.11 MIL/uL   Hemoglobin 9.6 (*) 12.0 - 15.0 g/dL   HCT 13.0 (*) 86.5 - 78.4 %   MCV 76.2 (*) 78.0 - 100.0 fL   MCH 22.6 (*)  26.0 - 34.0 pg   MCHC 29.6 (*) 30.0 - 36.0 g/dL   RDW 16.1 (*) 09.6 - 04.5 %   Platelets 274  150 - 400 K/uL   GC/Chlamydia pending--collected at pt request  IMAGING US Transvaginal Non-ob  06/02/2012  *RADIOLOGY REPORT*  Clinical Data: Patient 6 weeks postpartum with heavy bleeding for 1 week.  Question retained products of conception.  TRANSABDOMINAL AND TRANSVAGINAL ULTRASOUND OF PELVIS Technique:  Both transabdominal and transvaginal ultrasound examinations of the pelvis were performed. Transabdominal technique was performed for global imaging of the pelvis including uterus, ovaries, adnexal regions, and pelvic cul-de-sac.  It was necessary to proceed with endovaginal exam following the transabdominal exam to  visualize the endometrium.  Comparison:  None  Findings:  Uterus: Normal in size and appearance  Endometrium: Normal in thickness and appearance  Right ovary:  Normal appearance/no adnexal mass  Left ovary: Normal appearance/no adnexal mass  Other findings: No free fluid  IMPRESSION: Normal study. No evidence of pelvic mass or other significant abnormality.   Original Report Authenticated By: Bernadene Bell. Maricela Curet, M.D.    US Pelvis Complete  06/02/2012  *RADIOLOGY REPORT*  Clinical Data: Patient 6 weeks postpartum with heavy bleeding for 1 week.  Question retained products of conception.  TRANSABDOMINAL AND TRANSVAGINAL ULTRASOUND OF PELVIS Technique:  Both transabdominal and transvaginal ultrasound examinations of the pelvis were performed. Transabdominal technique was performed for global imaging of the pelvis including uterus, ovaries, adnexal regions, and pelvic cul-de-sac.  It was necessary to proceed with endovaginal exam following the transabdominal exam to visualize the endometrium.  Comparison:  None  Findings:  Uterus: Normal in size and appearance  Endometrium: Normal in thickness and appearance  Right ovary:  Normal appearance/no adnexal mass  Left ovary: Normal appearance/no adnexal mass  Other findings: No free fluid  IMPRESSION: Normal study. No evidence of pelvic mass or other significant abnormality.   Original Report Authenticated By: Bernadene Bell. D'ALESSIO, M.D.     ASSESSMENT 1. Menorrhagia     PLAN Discharge home with bleeding precautions Continue iron supplementation Message sent to Gyn clinic to reschedule postpartum visit/follow-up Discussed importance of follow up with patient  Pt given contact numbers for [facility] financial assistance Reviewed contraceptive choices, risks/benefits, pt to f/u at clinic Return to MAU as needed    Medication List     As of 06/02/2012  8:47 PM    ASK your doctor about these medications         Ferrous Sulfate 28 MG Tabs   Take 2 tablets by  mouth daily.      ibuprofen 200 MG tablet   Commonly known as: ADVIL,MOTRIN   Take 600 mg by mouth daily as needed. For pain      prenatal multivitamin Tabs   Take 1 tablet by mouth daily.      Valerian Root Extr   Take 1 tablet by mouth daily.         Sharen Counter Certified Nurse-Midwife 06/02/2012  8:47 PM

## 2012-06-02 NOTE — MAU Note (Signed)
Pt reports s/p C/S on 08/06, stopped bleeding 2 weeks ago and started bleeding again one week ago and about 3 days ago began having "heavy bleeding with large clots", denies pain. Had intercourse one week ago. For the last 2 days has been changing a pad q 1 hours.

## 2012-06-03 LAB — GC/CHLAMYDIA PROBE AMP, GENITAL
Chlamydia, DNA Probe: NEGATIVE
GC Probe Amp, Genital: NEGATIVE

## 2012-06-14 ENCOUNTER — Encounter: Payer: Self-pay | Admitting: Physician Assistant

## 2012-09-09 ENCOUNTER — Encounter (HOSPITAL_BASED_OUTPATIENT_CLINIC_OR_DEPARTMENT_OTHER): Payer: Self-pay | Admitting: Emergency Medicine

## 2012-09-09 ENCOUNTER — Emergency Department (HOSPITAL_BASED_OUTPATIENT_CLINIC_OR_DEPARTMENT_OTHER): Payer: Self-pay

## 2012-09-09 ENCOUNTER — Emergency Department (HOSPITAL_BASED_OUTPATIENT_CLINIC_OR_DEPARTMENT_OTHER)
Admission: EM | Admit: 2012-09-09 | Discharge: 2012-09-09 | Disposition: A | Discharge status: Home / Self Care | Payer: Self-pay | Attending: Emergency Medicine | Admitting: Emergency Medicine

## 2012-09-09 ENCOUNTER — Ambulatory Visit (HOSPITAL_BASED_OUTPATIENT_CLINIC_OR_DEPARTMENT_OTHER)
Admit: 2012-09-09 | Discharge: 2012-09-09 | Disposition: A | Discharge status: Home / Self Care | Payer: Self-pay | Attending: Emergency Medicine | Admitting: Emergency Medicine

## 2012-09-09 DIAGNOSIS — Z9889 Other specified postprocedural states: Secondary | ICD-10-CM | POA: Insufficient documentation

## 2012-09-09 DIAGNOSIS — R1011 Right upper quadrant pain: Secondary | ICD-10-CM | POA: Insufficient documentation

## 2012-09-09 DIAGNOSIS — Z79899 Other long term (current) drug therapy: Secondary | ICD-10-CM | POA: Insufficient documentation

## 2012-09-09 DIAGNOSIS — R109 Unspecified abdominal pain: Secondary | ICD-10-CM

## 2012-09-09 DIAGNOSIS — Z8742 Personal history of other diseases of the female genital tract: Secondary | ICD-10-CM | POA: Insufficient documentation

## 2012-09-09 DIAGNOSIS — R112 Nausea with vomiting, unspecified: Secondary | ICD-10-CM | POA: Insufficient documentation

## 2012-09-09 DIAGNOSIS — D649 Anemia, unspecified: Secondary | ICD-10-CM | POA: Insufficient documentation

## 2012-09-09 LAB — COMPREHENSIVE METABOLIC PANEL
ALT: 72 U/L — ABNORMAL HIGH (ref 0–35)
AST: 79 U/L — ABNORMAL HIGH (ref 0–37)
CO2: 23 mEq/L (ref 19–32)
Calcium: 8.6 mg/dL (ref 8.4–10.5)
Chloride: 106 mEq/L (ref 96–112)
GFR calc non Af Amer: >90 mL/min (ref >90)
Sodium: 140 mEq/L (ref 135–145)

## 2012-09-09 LAB — CBC WITH DIFFERENTIAL/PLATELET
Basophils Absolute: 0 10*3/uL (ref 0.0–0.1)
Eosinophils Relative: 1 % (ref 0–5)
Lymphocytes Relative: 21 % (ref 12–46)
Neutro Abs: 6.8 10*3/uL (ref 1.7–7.7)
Neutrophils Relative %: 73 % (ref 43–77)
Platelets: 238 10*3/uL (ref 150–400)
RDW: 16.5 % — ABNORMAL HIGH (ref 11.5–15.5)
WBC: 9.4 10*3/uL (ref 4.0–10.5)

## 2012-09-09 MED ORDER — ONDANSETRON HCL 4 MG/2ML IJ SOLN
4.0000 mg | Freq: Once | INTRAMUSCULAR | Status: AC
  Administered 2012-09-09: 4 mg via INTRAVENOUS
  Filled 2012-09-09: qty 2

## 2012-09-09 MED ORDER — FENTANYL CITRATE 0.05 MG/ML IJ SOLN
100.0000 ug | Freq: Once | INTRAMUSCULAR | Status: AC
  Administered 2012-09-09: 100 ug via INTRAVENOUS
  Filled 2012-09-09: qty 2

## 2012-09-09 MED ORDER — HYDROCODONE-ACETAMINOPHEN 5-500 MG PO TABS: 1.0000 | ORAL_TABLET | Freq: Four times a day (QID) | ORAL | Status: DC | PRN

## 2012-09-09 NOTE — ED Notes (Signed)
Po fluids given

## 2012-09-09 NOTE — ED Notes (Signed)
BIB EMS for RUQ pain, onset tonight after eating, vomited X3, no diarrhea, no fever, VSS 18g right AC, 600 ml NS and 4 mg Zofran pta, NAD

## 2012-09-09 NOTE — ED Notes (Signed)
Xray spoke with pt regarding instructions for coming back to have u/s completed this morning.

## 2012-09-09 NOTE — ED Notes (Signed)
Returned from xray

## 2012-09-09 NOTE — ED Provider Notes (Signed)
History     CSN: [id number]  Arrival date & time 09/09/12  0102   First MD Initiated Contact with Patient 09/09/12 0059      Chief Complaint  Patient presents with  . Abdominal Pain    (Consider location/radiation/quality/duration/timing/severity/associated sxs/prior treatment) Patient is a 21 y.o. female presenting with abdominal pain. The history is provided by the patient. No language interpreter was used.  Abdominal Pain The primary symptoms of the illness include abdominal pain, nausea and vomiting. The primary symptoms of the illness do not include fever, shortness of breath or diarrhea. The current episode started 6 to 12 hours ago. The onset of the illness was sudden. The problem has not changed since onset. The abdominal pain began 6 to 12 hours ago. The pain came on suddenly. The abdominal pain has been unchanged since its onset. The abdominal pain is located in the RUQ. The abdominal pain does not radiate. The abdominal pain is relieved by nothing. The abdominal pain is exacerbated by fatty foods.  The vomiting began yesterday. Vomiting occurs 2 to 5 times per day. The emesis contains stomach contents.  The illness is associated with eating. The patient states that she believes she is currently not pregnant. The patient has not had a change in bowel habit. Risk factors: none. Symptoms associated with the illness do not include back pain. Significant associated medical issues do not include PUD.    Past Medical History  Diagnosis Date  . History of blood transfusion   . Hemorrhage after delivery of fetus     Intraoperative, received blood transfusion  . Anemia   . Hx of ectopic pregnancy     Past Surgical History  Procedure Date  . Cesarean section   . Laparoscopy for ectopic pregnancy   . Cesarean section 04/23/2012    Procedure: CESAREAN SECTION;  Surgeon: Reva Bores, MD;  Location: [facility];  Service: Gynecology;  Laterality: N/A;    Family History  Problem  Relation Age of Onset  . Other Neg Hx     History  Substance Use Topics  . Smoking status: Never Smoker   . Smokeless tobacco: Never Used  . Alcohol Use: No    OB History    Grav Para Term Preterm Abortions TAB SAB Ect Mult Living   3 2 2  1   1  2       Review of Systems  Constitutional: Negative for fever.  Respiratory: Negative for shortness of breath.   Gastrointestinal: Positive for nausea, vomiting and abdominal pain. Negative for diarrhea.  Musculoskeletal: Negative for back pain.  All other systems reviewed and are negative.    Allergies  Shellfish allergy  Home Medications   Current Outpatient Rx  Name  Route  Sig  Dispense  Refill  . FERROUS SULFATE 28 MG PO TABS   Oral   Take 2 tablets by mouth daily.         . IBUPROFEN 200 MG PO TABS   Oral   Take 600 mg by mouth daily as needed. For pain         . PRENATAL MULTIVITAMIN CH   Oral   Take 1 tablet by mouth daily.   30 tablet   3   . VALERIAN ROOT EXTR   Oral   Take 1 tablet by mouth daily.           BP 123/80  Pulse 100  Temp 98.6 F (37 C) (Oral)  Resp 24  Ht 4'  11" (1.499 m)  Wt 176 lb (79.833 kg)  BMI 35.55 kg/m2  SpO2 100%  LMP 08/26/2012  Breastfeeding? No  Physical Exam  Constitutional: She is oriented to person, place, and time. She appears well-developed and well-nourished. No distress.  HENT:  Head: Normocephalic and atraumatic.  Mouth/Throat: Oropharynx is clear and moist.  Eyes: Conjunctivae normal are normal. Pupils are equal, round, and reactive to light.  Neck: Normal range of motion. Neck supple.  Cardiovascular: Normal rate and regular rhythm.   Pulmonary/Chest: Effort normal and breath sounds normal. She has no wheezes. She has no rales.  Abdominal: Soft. Bowel sounds are normal. There is tenderness. There is no rebound and no guarding.  Musculoskeletal: Normal range of motion.  Neurological: She is alert and oriented to person, place, and time.  Skin: Skin  is warm and dry.  Psychiatric: She has a normal mood and affect.    ED Course  Procedures (including critical care time)  Labs Reviewed  CBC WITH DIFFERENTIAL - Abnormal; Notable for the following:    Hemoglobin 10.0 (*)     HCT 31.9 (*)     MCV 73.5 (*)     MCH 23.0 (*)     RDW 16.5 (*)     All other components within normal limits  COMPREHENSIVE METABOLIC PANEL  LIPASE, BLOOD  PREGNANCY, URINE   No results found.   No diagnosis found.    MDM  RUQ pain post sausage sandwich.  Pain free with mild elevation of AST/ALT.  Will order outpatient [place] and refer to [facility].  Return sooner for recurrence of symptoms.  Patient verbalizes understanding and agrees to follow up        Tasheena Wambolt Smitty Cords, MD 09/09/12 1660

## 2012-09-09 NOTE — ED Notes (Signed)
Transported to xray 

## 2012-10-20 ENCOUNTER — Encounter (HOSPITAL_COMMUNITY): Payer: Self-pay | Admitting: Emergency Medicine

## 2012-10-20 ENCOUNTER — Inpatient Hospital Stay (HOSPITAL_COMMUNITY)
Admission: EM | Admit: 2012-10-20 | Discharge: 2012-10-24 | DRG: 417 | Disposition: A | Discharge status: Home / Self Care | Payer: Medicaid Other | Attending: General Surgery | Admitting: General Surgery

## 2012-10-20 DIAGNOSIS — K859 Acute pancreatitis without necrosis or infection, unspecified: Secondary | ICD-10-CM | POA: Diagnosis present

## 2012-10-20 DIAGNOSIS — K8020 Calculus of gallbladder without cholecystitis without obstruction: Principal | ICD-10-CM | POA: Diagnosis present

## 2012-10-20 DIAGNOSIS — K810 Acute cholecystitis: Secondary | ICD-10-CM

## 2012-10-20 NOTE — ED Notes (Signed)
Pt states she was unable to f/u with surgeon since Dec visit dx with gallstones.

## 2012-10-20 NOTE — ED Notes (Signed)
Per EMS pt transported from home with c/o RUQ pain onset 1830, +n/v x 4 today. Hx of Gallstones.

## 2012-10-21 ENCOUNTER — Emergency Department (HOSPITAL_COMMUNITY): Payer: Medicaid Other

## 2012-10-21 ENCOUNTER — Encounter (HOSPITAL_COMMUNITY): Payer: Self-pay

## 2012-10-21 DIAGNOSIS — K8010 Calculus of gallbladder with chronic cholecystitis without obstruction: Secondary | ICD-10-CM

## 2012-10-21 LAB — COMPREHENSIVE METABOLIC PANEL
BUN: 16 mg/dL (ref 6–23)
CO2: 21 mEq/L (ref 19–32)
Calcium: 9.6 mg/dL (ref 8.4–10.5)
Creatinine, Ser: 0.68 mg/dL (ref 0.50–1.10)
GFR calc Af Amer: >90 mL/min (ref >90)
GFR calc non Af Amer: >90 mL/min (ref >90)
Glucose, Bld: 126 mg/dL — ABNORMAL HIGH (ref 70–99)

## 2012-10-21 LAB — LIPASE, BLOOD: Lipase: 1611 U/L — ABNORMAL HIGH (ref 11–59)

## 2012-10-21 LAB — CBC WITH DIFFERENTIAL/PLATELET
Basophils Absolute: 0 10*3/uL (ref 0.0–0.1)
Eosinophils Relative: 0 % (ref 0–5)
HCT: 35.2 % — ABNORMAL LOW (ref 36.0–46.0)
Lymphocytes Relative: 19 % (ref 12–46)
Lymphs Abs: 2.1 10*3/uL (ref 0.7–4.0)
MCV: 73 fL — ABNORMAL LOW (ref 78.0–100.0)
Monocytes Absolute: 0.6 10*3/uL (ref 0.1–1.0)
Monocytes Relative: 6 % (ref 3–12)
RDW: 17.6 % — ABNORMAL HIGH (ref 11.5–15.5)
WBC: 11.2 10*3/uL — ABNORMAL HIGH (ref 4.0–10.5)

## 2012-10-21 LAB — URINALYSIS, ROUTINE W REFLEX MICROSCOPIC
Glucose, UA: NEGATIVE mg/dL
Hgb urine dipstick: NEGATIVE
Specific Gravity, Urine: 1.009 (ref 1.005–1.030)
Urobilinogen, UA: 0.2 mg/dL (ref 0.0–1.0)

## 2012-10-21 MED ORDER — CIPROFLOXACIN IN D5W 400 MG/200ML IV SOLN
400.0000 mg | Freq: Once | INTRAVENOUS | Status: AC
  Administered 2012-10-21: 400 mg via INTRAVENOUS
  Filled 2012-10-21: qty 200

## 2012-10-21 MED ORDER — MORPHINE SULFATE 2 MG/ML IJ SOLN
1.0000 mg | INTRAMUSCULAR | Status: DC | PRN
  Administered 2012-10-21: 2 mg via INTRAVENOUS
  Filled 2012-10-21: qty 1

## 2012-10-21 MED ORDER — SODIUM CHLORIDE 0.9 % IV SOLN
INTRAVENOUS | Status: DC
  Administered 2012-10-21: 02:00:00 via INTRAVENOUS

## 2012-10-21 MED ORDER — ONDANSETRON HCL 4 MG/2ML IJ SOLN
4.0000 mg | INTRAMUSCULAR | Status: DC | PRN
  Administered 2012-10-21: 4 mg via INTRAVENOUS
  Filled 2012-10-21: qty 2

## 2012-10-21 MED ORDER — ONDANSETRON HCL 4 MG/2ML IJ SOLN: 4.0000 mg | Freq: Four times a day (QID) | INTRAMUSCULAR | Status: DC | PRN

## 2012-10-21 MED ORDER — METRONIDAZOLE IN NACL 5-0.79 MG/ML-% IV SOLN
500.0000 mg | Freq: Once | INTRAVENOUS | Status: AC
  Administered 2012-10-21: 500 mg via INTRAVENOUS
  Filled 2012-10-21: qty 100

## 2012-10-21 MED ORDER — MORPHINE SULFATE 4 MG/ML IJ SOLN
4.0000 mg | INTRAMUSCULAR | Status: DC | PRN
  Administered 2012-10-21: 4 mg via INTRAVENOUS
  Filled 2012-10-21: qty 1

## 2012-10-21 MED ORDER — ENOXAPARIN SODIUM 40 MG/0.4ML ~~LOC~~ SOLN
40.0000 mg | SUBCUTANEOUS | Status: DC
  Filled 2012-10-21 (×3): qty 0.4

## 2012-10-21 MED ORDER — KCL IN DEXTROSE-NACL 20-5-0.45 MEQ/L-%-% IV SOLN
INTRAVENOUS | Status: DC
  Administered 2012-10-21 – 2012-10-22 (×2): via INTRAVENOUS
  Administered 2012-10-22: 125 mL/h via INTRAVENOUS
  Administered 2012-10-22: 18:00:00 via INTRAVENOUS
  Administered 2012-10-23: 125 mL/h via INTRAVENOUS
  Filled 2012-10-21 (×8): qty 1000

## 2012-10-21 MED ORDER — PANTOPRAZOLE SODIUM 40 MG IV SOLR
40.0000 mg | Freq: Every day | INTRAVENOUS | Status: DC
  Administered 2012-10-21 – 2012-10-22 (×2): 40 mg via INTRAVENOUS
  Filled 2012-10-21 (×3): qty 40

## 2012-10-21 NOTE — Progress Notes (Signed)
Patient ID: Sandra [name], female   DOB: 11-Jan-1991, 22 y.o.   MRN: [id number]    Subjective: Pain starting to come back but no nausea/vomiting right now  Objective: Vital signs in last 24 hours: Temp:  [98.6 F (37 C)-99 F (37.2 C)] 99 F (37.2 C) (02/03 0727) Pulse Rate:  [68-96] 80  (02/03 0727) Resp:  [16-18] 18  (02/03 0727) BP: (98-117)/(45-67) 98/45 mmHg (02/03 0727) SpO2:  [100 %] 100 % (02/03 0727) Weight:  [178 lb (80.74 kg)] 178 lb (80.74 kg) (02/02 2346) Last BM Date: 10/20/12  Intake/Output from previous day:   Intake/Output this shift:    PE: Abd: soft but tender along epigastric and upper flanks Heart: RRR Lungs: CTA bilateral General: awake and alert, NAD  Lab Results:   Basename 10/21/12 0157  WBC 11.2*  HGB 10.9*  HCT 35.2*  PLT 319   BMET  Basename 10/21/12 0157  NA 135  K 4.5  CL 100  CO2 21  GLUCOSE 126*  BUN 16  CREATININE 0.68  CALCIUM 9.6   PT/INR No results found for this basename: LABPROT:2,INR:2 in the last 72 hours CMP     Component Value Date/Time   NA 135 10/21/2012 0157   K 4.5 10/21/2012 0157   CL 100 10/21/2012 0157   CO2 21 10/21/2012 0157   GLUCOSE 126* 10/21/2012 0157   BUN 16 10/21/2012 0157   CREATININE 0.68 10/21/2012 0157   CALCIUM 9.6 10/21/2012 0157   PROT 7.8 10/21/2012 0157   ALBUMIN 3.5 10/21/2012 0157   AST 236* 10/21/2012 0157   ALT 113* 10/21/2012 0157   ALKPHOS 107 10/21/2012 0157   BILITOT 0.3 10/21/2012 0157   GFRNONAA >90 10/21/2012 0157   GFRAA >90 10/21/2012 0157   Lipase     Component Value Date/Time   LIPASE 1611* 10/21/2012 0157       Studies/Results: US Abdomen Complete  10/21/2012  *RADIOLOGY REPORT*  Clinical Data:  Right upper quadrant pain.  COMPLETE ABDOMINAL ULTRASOUND  Comparison:  09/09/2012.  Findings:  Gallbladder:  Multiple small gallstones measuring up to 3.2 mm. Gallbladder wall is thickened. Gallbladder appears partially contracted.  No pericholecystic fluid.  The patient was tender over this  region during scanning per ultrasound technologist. Cholecystitis not excluded in the proper clinical setting.  Common bile duct:  Common bile duct measures 7 mm at the level of the pancreatic head which is prominent.  Distal common bile duct stone not identified on present examination.  Liver:  No focal lesion identified.  Within normal limits in parenchymal echogenicity.  IVC:  Appears normal.  Pancreas:  No focal abnormality seen.  Spleen:  10.9 cm.  No focal mass.  Right Kidney:  10.7 cm.  No hydronephrosis.  5 mm echogenic structure upper pole region is once again noted.  This may represent a calcification although does not clearly shadow. Angiomyolipoma not excluded.  Left Kidney:  11.2 cm.  Abdominal aorta:  No aneurysm identified.  IMPRESSION: Multiple small gallstones measuring up to 3.2 mm.  Gallbladder wall is thickened. Gallbladder appears partially contracted.  No pericholecystic fluid.  The patient was tender over this region during scanning per ultrasound technologist.  Cholecystitis not excluded in the proper clinical setting.  Common bile duct measures 7 mm at the level of the pancreatic head which is prominent.  Distal common bile duct stone not identified on present examination.  5 mm echogenic structure upper pole right kidney as noted above.  Critical Value/emergent results were  called by telephone at the time of interpretation on 10/21/2012 at 2:444 am to Dr. Clarene Duke., who verbally acknowledged these results.   Original Report Authenticated By: Lacy Duverney, M.D.     Anti-infectives: Anti-infectives     Start     Dose/Rate Route Frequency Ordered Stop   10/21/12 0345   ciprofloxacin (CIPRO) IVPB 400 mg        400 mg 200 mL/hr over 60 Minutes Intravenous  Once 10/21/12 0344 10/21/12 0556   10/21/12 0345   metroNIDAZOLE (FLAGYL) IVPB 500 mg        500 mg 100 mL/hr over 60 Minutes Intravenous  Once 10/21/12 0344 10/21/12 0644           Assessment/Plan 1. Gallstone  pancreatitis: comfortable, labs ordered for am, ice chips ok otherwise NPO, allow pancreatitis to cool off then plan for lap chole this admission  LOS: 1 day    Wednesday Ericsson 10/21/2012

## 2012-10-21 NOTE — Care Management Note (Signed)
    Page 1 of 1   10/21/2012     1:25:40 PM   CARE MANAGEMENT NOTE 10/21/2012  Patient:  Sandra [name], Sandra [name]   Account Number:  [id number]  Date Initiated:  10/21/2012  Documentation initiated by:  Lorenda Ishihara  Subjective/Objective Assessment:   22 yo female admitted with abd pain, cholecystitis, pancreatitis. PTA lived at home with significant other.     Action/Plan:   Home when stable   Anticipated DC Date:  10/25/2012   Anticipated DC Plan:  HOME/SELF CARE      DC Planning Services  CM consult      Choice offered to / List presented to:             Status of service:  Completed, signed off Medicare Important Message given?   (If response is "NO", the following Medicare IM given date fields will be blank) Date Medicare IM given:   Date Additional Medicare IM given:    Discharge Disposition:  HOME/SELF CARE  Per UR Regulation:  Reviewed for med. necessity/level of care/duration of stay  If discussed at Long Length of Stay Meetings, dates discussed:    Comments:

## 2012-10-21 NOTE — ED Notes (Signed)
Pt states that pain started after she ate supper. She had a cheeseburger for dinner.

## 2012-10-21 NOTE — H&P (Signed)
Reason for Consult:gallstones Referring Physician: Ethell Blatchford is an 22 y.o. female.  HPI: we were asked to evaluate this patient for right upper quadrant pain and gallstones. She has known about her gallstones since December of last year she began having symptoms at that time during her pregnancy. She has had right upper quadrant sharp and stabbing pain about one time per week usually after eating meat. She has tried to get relief with Pepcid and Pepto-Bismol without any success. Her most recent episode began last night at about 6:30 PM after eating a cheeseburger. She describes this as right upper quadrant pain which is sharp and stabbing which was 10 out of 10 pain but now has improved since receiving pain medication. She had 5 episodes of vomiting without any blood. She says that her bowels are okay without any blood in the stools or melena. She denies any fevers or chills and denies any heartburn symptoms.  Past Medical History  Diagnosis Date  . History of blood transfusion   . Hemorrhage after delivery of fetus     Intraoperative, received blood transfusion  . Anemia   . Hx of ectopic pregnancy     Past Surgical History  Procedure Date  . Cesarean section   . Laparoscopy for ectopic pregnancy   . Cesarean section 04/23/2012    Procedure: CESAREAN SECTION;  Surgeon: Reva Bores, MD;  Location: [facility];  Service: Gynecology;  Laterality: N/A;    Family History  Problem Relation Age of Onset  . Other Neg Hx     Social History:  reports that she has never smoked. She has never used smokeless tobacco. She reports that she does not drink alcohol or use illicit drugs.  Allergies:  Allergies  Allergen Reactions  . Shellfish Allergy Anaphylaxis    Medications: I have reviewed the patient's current medications.  Results for orders placed during the hospital encounter of 10/20/12 (from the past 48 hour(s))  CBC WITH DIFFERENTIAL     Status: Abnormal   Collection  Time   10/21/12  1:57 AM      Component Value Range Comment   WBC 11.2 (*) 4.0 - 10.5 K/uL    RBC 4.82  3.87 - 5.11 MIL/uL    Hemoglobin 10.9 (*) 12.0 - 15.0 g/dL    HCT 40.9 (*) 81.1 - 46.0 %    MCV 73.0 (*) 78.0 - 100.0 fL    MCH 22.6 (*) 26.0 - 34.0 pg    MCHC 31.0  30.0 - 36.0 g/dL    RDW 91.4 (*) 78.2 - 15.5 %    Platelets 319  150 - 400 K/uL    Neutrophils Relative 75  43 - 77 %    Neutro Abs 8.4 (*) 1.7 - 7.7 K/uL    Lymphocytes Relative 19  12 - 46 %    Lymphs Abs 2.1  0.7 - 4.0 K/uL    Monocytes Relative 6  3 - 12 %    Monocytes Absolute 0.6  0.1 - 1.0 K/uL    Eosinophils Relative 0  0 - 5 %    Eosinophils Absolute 0.1  0.0 - 0.7 K/uL    Basophils Relative 0  0 - 1 %    Basophils Absolute 0.0  0.0 - 0.1 K/uL   COMPREHENSIVE METABOLIC PANEL     Status: Abnormal   Collection Time   10/21/12  1:57 AM      Component Value Range Comment   Sodium 135  135 - 145 mEq/L    Potassium 4.5  3.5 - 5.1 mEq/L    Chloride 100  96 - 112 mEq/L    CO2 21  19 - 32 mEq/L    Glucose, Bld 126 (*) 70 - 99 mg/dL    BUN 16  6 - 23 mg/dL    Creatinine, Ser 7.82  0.50 - 1.10 mg/dL    Calcium 9.6  8.4 - 95.6 mg/dL    Total Protein 7.8  6.0 - 8.3 g/dL    Albumin 3.5  3.5 - 5.2 g/dL    AST 213 (*) 0 - 37 U/L    ALT 113 (*) 0 - 35 U/L    Alkaline Phosphatase 107  39 - 117 U/L    Total Bilirubin 0.3  0.3 - 1.2 mg/dL    GFR calc non Af Amer >90  >90 mL/min    GFR calc Af Amer >90  >90 mL/min   LIPASE, BLOOD     Status: Abnormal   Collection Time   10/21/12  1:57 AM      Component Value Range Comment   Lipase 1611 (*) 11 - 59 U/L     US Abdomen Complete  10/21/2012  *RADIOLOGY REPORT*  Clinical Data:  Right upper quadrant pain.  COMPLETE ABDOMINAL ULTRASOUND  Comparison:  09/09/2012.  Findings:  Gallbladder:  Multiple small gallstones measuring up to 3.2 mm. Gallbladder wall is thickened. Gallbladder appears partially contracted.  No pericholecystic fluid.  The patient was tender over this region  during scanning per ultrasound technologist. Cholecystitis not excluded in the proper clinical setting.  Common bile duct:  Common bile duct measures 7 mm at the level of the pancreatic head which is prominent.  Distal common bile duct stone not identified on present examination.  Liver:  No focal lesion identified.  Within normal limits in parenchymal echogenicity.  IVC:  Appears normal.  Pancreas:  No focal abnormality seen.  Spleen:  10.9 cm.  No focal mass.  Right Kidney:  10.7 cm.  No hydronephrosis.  5 mm echogenic structure upper pole region is once again noted.  This may represent a calcification although does not clearly shadow. Angiomyolipoma not excluded.  Left Kidney:  11.2 cm.  Abdominal aorta:  No aneurysm identified.  IMPRESSION: Multiple small gallstones measuring up to 3.2 mm.  Gallbladder wall is thickened. Gallbladder appears partially contracted.  No pericholecystic fluid.  The patient was tender over this region during scanning per ultrasound technologist.  Cholecystitis not excluded in the proper clinical setting.  Common bile duct measures 7 mm at the level of the pancreatic head which is prominent.  Distal common bile duct stone not identified on present examination.  5 mm echogenic structure upper pole right kidney as noted above.  Critical Value/emergent results were called by telephone at the time of interpretation on 10/21/2012 at 2:444 am to Dr. Clarene Duke., who verbally acknowledged these results.   Original Report Authenticated By: Lacy Duverney, M.D.     All other review of systems negative or noncontributory except as stated in the HPI  Blood pressure 117/67, pulse 96, temperature 98.6 F (37 C), temperature source Oral, resp. rate 16, height 4\' 11"  (1.499 m), weight 178 lb (80.74 kg), SpO2 100.00%. General appearance: alert, cooperative and no distress Head: Normocephalic, without obvious abnormality, atraumatic Neck: no JVD and supple, symmetrical, trachea midline Resp:  nonlabored Cardio: normal rate, regular GI: soft, moderate epigastric tenderness to deep palpation, some RUQ pain as well, ND, no  peritoneal signs. Extremities: extremities normal, atraumatic, no cyanosis or edema Skin: Skin color, texture, turgor normal. No rashes or lesions Neurologic: Grossly normal  Assessment/Plan: Cholelithiasis and abdominal pain and pancreatitis She has symptoms which sound very classic for symptomatic cholelithiasis. She also has an elevated lipase and concern for possible gallstone pancreatitis. Her tenderness is improving currently however, given her classic symptoms, gallstones, and elevated lipase, I have recommended hospital admission, n.p.o., and cholecystectomy during this hospitalization. We discussed the procedure and its risks and she is agreeable with this plan. We will go ahead and admit her to the hospital and keep her n.p.o. With fluid hydration and pain control and we will proceed with cholecystectomy likely after her lipase improves.  Lodema Pilot DAVID 10/21/2012, 6:10 AM

## 2012-10-21 NOTE — ED Provider Notes (Signed)
History     CSN: [id number]  Arrival date & time 10/20/12  2328   First MD Initiated Contact with Patient 10/21/12 0011      Chief Complaint  Patient presents with  . Abdominal Pain     HPI Pt was seen at 0030.   Per pt, c/o gradual onset and persistence of constant upper abd "pain" since approx 1800 last night.  Has been associated with multiple intermittent episodes of N/V.  Describes the abd pain as "I think it's my gallsones."  States her symptoms began shortly after she ate a cheeseburger.  Pt states she was seen in the ED 2 months ago for same symptoms, dx biliary colic.  Has not f/u with General Surgeon.  Denies diarrhea, no fevers, no back pain, no rash, no CP/SOB, no black or blood in stools or emesis.       Past Medical History  Diagnosis Date  . History of blood transfusion   . Hemorrhage after delivery of fetus     Intraoperative, received blood transfusion  . Anemia   . Hx of ectopic pregnancy     Past Surgical History  Procedure Date  . Cesarean section   . Laparoscopy for ectopic pregnancy   . Cesarean section 04/23/2012    Procedure: CESAREAN SECTION;  Surgeon: Reva Bores, MD;  Location: [facility];  Service: Gynecology;  Laterality: N/A;    Family History  Problem Relation Age of Onset  . Other Neg Hx     History  Substance Use Topics  . Smoking status: Never Smoker   . Smokeless tobacco: Never Used  . Alcohol Use: No    OB History    Grav Para Term Preterm Abortions TAB SAB Ect Mult Living   3 2 2  1   1  2       Review of Systems ROS: Statement: All systems negative except as marked or noted in the HPI; Constitutional: Negative for fever and chills. ; ; Eyes: Negative for eye pain, redness and discharge. ; ; ENMT: Negative for ear pain, hoarseness, nasal congestion, sinus pressure and sore throat. ; ; Cardiovascular: Negative for chest pain, palpitations, diaphoresis, dyspnea and peripheral edema. ; ; Respiratory: Negative for cough, wheezing and  stridor. ; ; Gastrointestinal: +N/V, abd pain. Negative for diarrhea, blood in stool, hematemesis, jaundice and rectal bleeding. . ; ; Genitourinary: Negative for dysuria, flank pain and hematuria. ; ; Musculoskeletal: Negative for back pain and neck pain. Negative for swelling and trauma.; ; Skin: Negative for pruritus, rash, abrasions, blisters, bruising and skin lesion.; ; Neuro: Negative for headache, lightheadedness and neck stiffness. Negative for weakness, altered level of consciousness , altered mental status, extremity weakness, paresthesias, involuntary movement, seizure and syncope.       Allergies  Shellfish allergy  Home Medications   Current Outpatient Rx  Name  Route  Sig  Dispense  Refill  . ADULT MULTIVITAMIN W/MINERALS CH   Oral   Take 1 tablet by mouth daily.           BP 117/67  Pulse 96  Temp 98.6 F (37 C) (Oral)  Resp 16  Ht 4\' 11"  (1.499 m)  Wt 178 lb (80.74 kg)  BMI 35.95 kg/m2  SpO2 100%  Physical Exam 0035: Physical examination:  Nursing notes reviewed; Vital signs and O2 SAT reviewed;  Constitutional: Well developed, Well nourished, Well hydrated, Uncomfortable appearing; Head:  Normocephalic, atraumatic; Eyes: EOMI, PERRL, No scleral icterus; ENMT: Mouth and pharynx normal,  Mucous membranes moist; Neck: Supple, Full range of motion, No lymphadenopathy; Cardiovascular: Regular rate and rhythm, No murmur, rub, or gallop; Respiratory: Breath sounds clear & equal bilaterally, No rales, rhonchi, wheezes.  Speaking full sentences with ease, Normal respiratory effort/excursion; Chest: Nontender, Movement normal; Abdomen: Soft, +RUQ tenderness to palp, no rebound or guarding. Nondistended, Normal bowel sounds; Genitourinary: No CVA tenderness; Extremities: Pulses normal, No tenderness, No edema, No calf edema or asymmetry.; Neuro: AA&Ox3, Major CN grossly intact.  Speech clear. No gross focal motor or sensory deficits in extremities.; Skin: Color normal, Warm,  Dry.   ED Course  Procedures    MDM  MDM Reviewed: nursing note, vitals and previous chart Reviewed previous: ultrasound and labs Interpretation: labs and ultrasound   Results for orders placed during the hospital encounter of 10/20/12  CBC WITH DIFFERENTIAL      Component Value Range   WBC 11.2 (*) 4.0 - 10.5 K/uL   RBC 4.82  3.87 - 5.11 MIL/uL   Hemoglobin 10.9 (*) 12.0 - 15.0 g/dL   HCT 16.1 (*) 09.6 - 04.5 %   MCV 73.0 (*) 78.0 - 100.0 fL   MCH 22.6 (*) 26.0 - 34.0 pg   MCHC 31.0  30.0 - 36.0 g/dL   RDW 40.9 (*) 81.1 - 91.4 %   Platelets 319  150 - 400 K/uL   Neutrophils Relative 75  43 - 77 %   Neutro Abs 8.4 (*) 1.7 - 7.7 K/uL   Lymphocytes Relative 19  12 - 46 %   Lymphs Abs 2.1  0.7 - 4.0 K/uL   Monocytes Relative 6  3 - 12 %   Monocytes Absolute 0.6  0.1 - 1.0 K/uL   Eosinophils Relative 0  0 - 5 %   Eosinophils Absolute 0.1  0.0 - 0.7 K/uL   Basophils Relative 0  0 - 1 %   Basophils Absolute 0.0  0.0 - 0.1 K/uL  COMPREHENSIVE METABOLIC PANEL      Component Value Range   Sodium 135  135 - 145 mEq/L   Potassium 4.5  3.5 - 5.1 mEq/L   Chloride 100  96 - 112 mEq/L   CO2 21  19 - 32 mEq/L   Glucose, Bld 126 (*) 70 - 99 mg/dL   BUN 16  6 - 23 mg/dL   Creatinine, Ser 7.82  0.50 - 1.10 mg/dL   Calcium 9.6  8.4 - 95.6 mg/dL   Total Protein 7.8  6.0 - 8.3 g/dL   Albumin 3.5  3.5 - 5.2 g/dL   AST 213 (*) 0 - 37 U/L   ALT 113 (*) 0 - 35 U/L   Alkaline Phosphatase 107  39 - 117 U/L   Total Bilirubin 0.3  0.3 - 1.2 mg/dL   GFR calc non Af Amer >90  >90 mL/min   GFR calc Af Amer >90  >90 mL/min  LIPASE, BLOOD      Component Value Range   Lipase 1611 (*) 11 - 59 U/L   US Abdomen Complete 10/21/2012  *RADIOLOGY REPORT*  Clinical Data:  Right upper quadrant pain.  COMPLETE ABDOMINAL ULTRASOUND  Comparison:  09/09/2012.  Findings:  Gallbladder:  Multiple small gallstones measuring up to 3.2 mm. Gallbladder wall is thickened. Gallbladder appears partially contracted.   No pericholecystic fluid.  The patient was tender over this region during scanning per ultrasound technologist. Cholecystitis not excluded in the proper clinical setting.  Common bile duct:  Common bile duct measures 7 mm at  the level of the pancreatic head which is prominent.  Distal common bile duct stone not identified on present examination.  Liver:  No focal lesion identified.  Within normal limits in parenchymal echogenicity.  IVC:  Appears normal.  Pancreas:  No focal abnormality seen.  Spleen:  10.9 cm.  No focal mass.  Right Kidney:  10.7 cm.  No hydronephrosis.  5 mm echogenic structure upper pole region is once again noted.  This may represent a calcification although does not clearly shadow. Angiomyolipoma not excluded.  Left Kidney:  11.2 cm.  Abdominal aorta:  No aneurysm identified.  IMPRESSION: Multiple small gallstones measuring up to 3.2 mm.  Gallbladder wall is thickened. Gallbladder appears partially contracted.  No pericholecystic fluid.  The patient was tender over this region during scanning per ultrasound technologist.  Cholecystitis not excluded in the proper clinical setting.  Common bile duct measures 7 mm at the level of the pancreatic head which is prominent.  Distal common bile duct stone not identified on present examination.  5 mm echogenic structure upper pole right kidney as noted above.  Critical Value/emergent results were called by telephone at the time of interpretation on 10/21/2012 at 2:444 am to Dr. Clarene Duke., who verbally acknowledged these results.   Original Report Authenticated By: Lacy Duverney, M.D.    Results for CELINDA, DETHLEFS (MRN [id number]) as of 10/21/2012 06:11  Ref. Range 06/02/2012 19:58 09/09/2012 01:36 10/21/2012 01:57  Hemoglobin Latest Range: 12.0-15.0 g/dL 9.6 (L) 62.9 (L) 52.8 (L)  HCT Latest Range: 36.0-46.0 % 32.4 (L) 31.9 (L) 35.2 (L)   Results for SHUNTAY, EVERETTS (MRN [id number]) as of 10/21/2012 06:11  Ref. Range 09/09/2012 01:36 10/21/2012 01:57   Lipase Latest Range: 11-59 U/L 32 1611 (H)  AST Latest Range: 0-37 U/L 79 (H) 236 (H)  ALT Latest Range: 0-35 U/L 72 (H) 113 (H)     0500:  LFT's and lipase elevated from previous.  Likely acute cholecystitis on [place].  Will dose IV abx.  Dx and testing d/w pt.  Questions answered.  Verb understanding, agreeable to admit.   T/C to General Surgery Dr. Biagio Quint, case discussed, including:  HPI, pertinent PM/SHx, VS/PE, dx testing, ED course and treatment:  Agreeable to admit, requests he will come to ED for eval.           Laray Anger, DO 10/21/12 1816

## 2012-10-21 NOTE — Progress Notes (Signed)
Pt with BP 88/55 HR 78. Paged MD. New orders given to recheck BP in 1 hour.

## 2012-10-21 NOTE — Progress Notes (Signed)
Pt recheck on BP 123/74 Hr 80. Will continue to monitor.

## 2012-10-22 LAB — COMPREHENSIVE METABOLIC PANEL
ALT: 73 U/L — ABNORMAL HIGH (ref 0–35)
AST: 52 U/L — ABNORMAL HIGH (ref 0–37)
Alkaline Phosphatase: 97 U/L (ref 39–117)
GFR calc Af Amer: >90 mL/min (ref >90)
Glucose, Bld: 122 mg/dL — ABNORMAL HIGH (ref 70–99)
Potassium: 4.2 mEq/L (ref 3.5–5.1)
Sodium: 135 mEq/L (ref 135–145)
Total Protein: 7.4 g/dL (ref 6.0–8.3)

## 2012-10-22 LAB — CBC
HCT: 33.6 % — ABNORMAL LOW (ref 36.0–46.0)
MCH: 22.5 pg — ABNORMAL LOW (ref 26.0–34.0)
MCHC: 30.4 g/dL (ref 30.0–36.0)
MCV: 74.2 fL — ABNORMAL LOW (ref 78.0–100.0)
RDW: 18 % — ABNORMAL HIGH (ref 11.5–15.5)

## 2012-10-22 NOTE — Progress Notes (Signed)
Patient ID: Sandra [name], female   DOB: [birth year], 22 y.o.   MRN: [id number] Subjective: Pt reports feeling better today, min pain now, denies n/v  Objective: Vital signs in last 24 hours: Temp:  [98 F (36.7 C)-99 F (37.2 C)] 98.1 F (36.7 C) (02/04 0551) Pulse Rate:  [68-90] 68  (02/04 0551) Resp:  [16-18] 16  (02/04 0551) BP: (88-130)/(43-79) 103/63 mmHg (02/04 0551) SpO2:  [92 %-100 %] 96 % (02/04 0551) Last BM Date: 10/20/12  Intake/Output from previous day: 02/03 0701 - 02/04 0700 In: 1597.9 [I.V.:1597.9] Out: 1600 [Urine:1600] Intake/Output this shift: Total I/O In: -  Out: 650 [Urine:650]  PE: Abd: soft but tender along epigastric and upper flanks, better then yesterday Heart: RRR Lungs: CTA bilateral General: awake and alert, NAD  Lab Results:   Basename 10/22/12 0420 10/21/12 0157  WBC 5.8 11.2*  HGB 10.2* 10.9*  HCT 33.6* 35.2*  PLT 290 319   BMET  Basename 10/22/12 0420 10/21/12 0157  NA 135 135  K 4.2 4.5  CL 103 100  CO2 25 21  GLUCOSE 122* 126*  BUN 7 16  CREATININE 0.81 0.68  CALCIUM 9.0 9.6   PT/INR No results found for this basename: LABPROT:2,INR:2 in the last 72 hours CMP     Component Value Date/Time   NA 135 10/22/2012 0420   K 4.2 10/22/2012 0420   CL 103 10/22/2012 0420   CO2 25 10/22/2012 0420   GLUCOSE 122* 10/22/2012 0420   BUN 7 10/22/2012 0420   CREATININE 0.81 10/22/2012 0420   CALCIUM 9.0 10/22/2012 0420   PROT 7.4 10/22/2012 0420   ALBUMIN 3.1* 10/22/2012 0420   AST 52* 10/22/2012 0420   ALT 73* 10/22/2012 0420   ALKPHOS 97 10/22/2012 0420   BILITOT 0.3 10/22/2012 0420   GFRNONAA >90 10/22/2012 0420   GFRAA >90 10/22/2012 0420   Lipase     Component Value Date/Time   LIPASE 24 10/22/2012 0420       Studies/Results: US Abdomen Complete  10/21/2012  *RADIOLOGY REPORT*  Clinical Data:  Right upper quadrant pain.  COMPLETE ABDOMINAL ULTRASOUND  Comparison:  09/09/2012.  Findings:  Gallbladder:  Multiple small gallstones measuring up to  3.2 mm. Gallbladder wall is thickened. Gallbladder appears partially contracted.  No pericholecystic fluid.  The patient was tender over this region during scanning per ultrasound technologist. Cholecystitis not excluded in the proper clinical setting.  Common bile duct:  Common bile duct measures 7 mm at the level of the pancreatic head which is prominent.  Distal common bile duct stone not identified on present examination.  Liver:  No focal lesion identified.  Within normal limits in parenchymal echogenicity.  IVC:  Appears normal.  Pancreas:  No focal abnormality seen.  Spleen:  10.9 cm.  No focal mass.  Right Kidney:  10.7 cm.  No hydronephrosis.  5 mm echogenic structure upper pole region is once again noted.  This may represent a calcification although does not clearly shadow. Angiomyolipoma not excluded.  Left Kidney:  11.2 cm.  Abdominal aorta:  No aneurysm identified.  IMPRESSION: Multiple small gallstones measuring up to 3.2 mm.  Gallbladder wall is thickened. Gallbladder appears partially contracted.  No pericholecystic fluid.  The patient was tender over this region during scanning per ultrasound technologist.  Cholecystitis not excluded in the proper clinical setting.  Common bile duct measures 7 mm at the level of the pancreatic head which is prominent.  Distal common bile duct stone not identified  on present examination.  5 mm echogenic structure upper pole right kidney as noted above.  Critical Value/emergent results were called by telephone at the time of interpretation on 10/21/2012 at 2:444 am to Dr. Clarene Duke., who verbally acknowledged these results.   Original Report Authenticated By: Lacy Duverney, M.D.     Anti-infectives: Anti-infectives     Start     Dose/Rate Route Frequency Ordered Stop   10/21/12 0345   ciprofloxacin (CIPRO) IVPB 400 mg        400 mg 200 mL/hr over 60 Minutes Intravenous  Once 10/21/12 0344 10/21/12 0556   10/21/12 0345   metroNIDAZOLE (FLAGYL) IVPB 500 mg         500 mg 100 mL/hr over 60 Minutes Intravenous  Once 10/21/12 0344 10/21/12 0644           Assessment/Plan 1. Gallstone pancreatitis: lipase normalized, symptoms improved, will plan for lap chole tomorrow, allow clears today, RBA of surgery discussed with patient who expresses understanding  --clears  --NPO after MN  --lap chole tomorrow.  LOS: 2 days    Zion Lint 10/22/2012

## 2012-10-23 ENCOUNTER — Inpatient Hospital Stay (HOSPITAL_COMMUNITY): Payer: Medicaid Other

## 2012-10-23 ENCOUNTER — Inpatient Hospital Stay (HOSPITAL_COMMUNITY): Payer: Medicaid Other | Admitting: Anesthesiology

## 2012-10-23 ENCOUNTER — Encounter (HOSPITAL_COMMUNITY): Admission: EM | Disposition: A | Discharge status: Home / Self Care | Payer: Self-pay | Source: Home / Self Care

## 2012-10-23 ENCOUNTER — Encounter (HOSPITAL_COMMUNITY): Payer: Self-pay | Admitting: Anesthesiology

## 2012-10-23 HISTORY — PX: CHOLECYSTECTOMY: SHX55

## 2012-10-23 LAB — SURGICAL PCR SCREEN: MRSA, PCR: NEGATIVE

## 2012-10-23 SURGERY — LAPAROSCOPIC CHOLECYSTECTOMY WITH INTRAOPERATIVE CHOLANGIOGRAM
Anesthesia: General | Site: Abdomen | Wound class: Clean Contaminated

## 2012-10-23 MED ORDER — LIDOCAINE HCL (CARDIAC) 20 MG/ML IV SOLN
INTRAVENOUS | Status: DC | PRN
  Administered 2012-10-23: 100 mg via INTRAVENOUS

## 2012-10-23 MED ORDER — IOHEXOL 300 MG/ML  SOLN
INTRAMUSCULAR | Status: DC | PRN
  Administered 2012-10-23: 14 mL

## 2012-10-23 MED ORDER — ONDANSETRON HCL 4 MG PO TABS: 4.0000 mg | ORAL_TABLET | Freq: Four times a day (QID) | ORAL | Status: DC | PRN

## 2012-10-23 MED ORDER — MIDAZOLAM HCL 5 MG/5ML IJ SOLN
INTRAMUSCULAR | Status: DC | PRN
  Administered 2012-10-23: 2 mg via INTRAVENOUS

## 2012-10-23 MED ORDER — SUCCINYLCHOLINE CHLORIDE 20 MG/ML IJ SOLN
INTRAMUSCULAR | Status: DC | PRN
  Administered 2012-10-23: 100 mg via INTRAVENOUS

## 2012-10-23 MED ORDER — CISATRACURIUM BESYLATE (PF) 10 MG/5ML IV SOLN
INTRAVENOUS | Status: DC | PRN
  Administered 2012-10-23: 6 mg via INTRAVENOUS

## 2012-10-23 MED ORDER — MORPHINE SULFATE 2 MG/ML IJ SOLN
1.0000 mg | INTRAMUSCULAR | Status: DC | PRN
  Administered 2012-10-23: 1 mg via INTRAVENOUS
  Filled 2012-10-23: qty 1

## 2012-10-23 MED ORDER — DEXTROSE 5 % IV SOLN
2.0000 g | INTRAVENOUS | Status: DC | PRN
  Administered 2012-10-23: 2 g via INTRAVENOUS

## 2012-10-23 MED ORDER — CHLORHEXIDINE GLUCONATE CLOTH 2 % EX PADS
6.0000 | MEDICATED_PAD | Freq: Every day | CUTANEOUS | Status: DC
  Administered 2012-10-24: 6 via TOPICAL

## 2012-10-23 MED ORDER — METOCLOPRAMIDE HCL 5 MG/ML IJ SOLN
INTRAMUSCULAR | Status: DC | PRN
  Administered 2012-10-23: 10 mg via INTRAVENOUS

## 2012-10-23 MED ORDER — GLYCOPYRROLATE 0.2 MG/ML IJ SOLN
INTRAMUSCULAR | Status: DC | PRN
  Administered 2012-10-23: 0.6 mg via INTRAVENOUS

## 2012-10-23 MED ORDER — KCL IN DEXTROSE-NACL 20-5-0.45 MEQ/L-%-% IV SOLN
INTRAVENOUS | Status: DC
  Administered 2012-10-23: 100 mL/h via INTRAVENOUS
  Filled 2012-10-23 (×3): qty 1000

## 2012-10-23 MED ORDER — BUPIVACAINE LIPOSOME 1.3 % IJ SUSP
20.0000 mL | INTRAMUSCULAR | Status: DC
  Filled 2012-10-23: qty 20

## 2012-10-23 MED ORDER — KETOROLAC TROMETHAMINE 30 MG/ML IJ SOLN
15.0000 mg | Freq: Once | INTRAMUSCULAR | Status: AC | PRN
  Administered 2012-10-23: 30 mg via INTRAVENOUS

## 2012-10-23 MED ORDER — FENTANYL CITRATE 0.05 MG/ML IJ SOLN
25.0000 ug | INTRAMUSCULAR | Status: DC | PRN
  Administered 2012-10-23 (×2): 25 ug via INTRAVENOUS

## 2012-10-23 MED ORDER — DEXAMETHASONE SODIUM PHOSPHATE 10 MG/ML IJ SOLN
INTRAMUSCULAR | Status: DC | PRN
  Administered 2012-10-23: 10 mg via INTRAVENOUS

## 2012-10-23 MED ORDER — ONDANSETRON HCL 4 MG/2ML IJ SOLN
INTRAMUSCULAR | Status: DC | PRN
  Administered 2012-10-23: 4 mg via INTRAVENOUS

## 2012-10-23 MED ORDER — NEOSTIGMINE METHYLSULFATE 1 MG/ML IJ SOLN
INTRAMUSCULAR | Status: DC | PRN
  Administered 2012-10-23: 4 mg via INTRAVENOUS

## 2012-10-23 MED ORDER — PHENYLEPHRINE HCL 10 MG/ML IJ SOLN
INTRAMUSCULAR | Status: DC | PRN
  Administered 2012-10-23 (×2): 40 ug via INTRAVENOUS

## 2012-10-23 MED ORDER — MUPIROCIN 2 % EX OINT
1.0000 "application " | TOPICAL_OINTMENT | Freq: Two times a day (BID) | CUTANEOUS | Status: DC
  Administered 2012-10-23 – 2012-10-24 (×2): 1 via NASAL
  Filled 2012-10-23: qty 22

## 2012-10-23 MED ORDER — OXYCODONE-ACETAMINOPHEN 5-325 MG PO TABS
1.0000 | ORAL_TABLET | ORAL | Status: DC | PRN
  Administered 2012-10-23 – 2012-10-24 (×3): 2 via ORAL
  Filled 2012-10-23 (×3): qty 2

## 2012-10-23 MED ORDER — BUPIVACAINE LIPOSOME 1.3 % IJ SUSP
INTRAMUSCULAR | Status: DC | PRN
  Administered 2012-10-23: 20 mL

## 2012-10-23 MED ORDER — FENTANYL CITRATE 0.05 MG/ML IJ SOLN
INTRAMUSCULAR | Status: DC | PRN
  Administered 2012-10-23: 50 ug via INTRAVENOUS
  Administered 2012-10-23: 100 ug via INTRAVENOUS
  Administered 2012-10-23: 50 ug via INTRAVENOUS

## 2012-10-23 MED ORDER — HEPARIN SODIUM (PORCINE) 5000 UNIT/ML IJ SOLN
5000.0000 [IU] | Freq: Three times a day (TID) | INTRAMUSCULAR | Status: DC
  Administered 2012-10-23 – 2012-10-24 (×2): 5000 [IU] via SUBCUTANEOUS
  Filled 2012-10-23 (×5): qty 1

## 2012-10-23 MED ORDER — LACTATED RINGERS IV SOLN
INTRAVENOUS | Status: DC
  Administered 2012-10-23: 12:00:00 via INTRAVENOUS
  Administered 2012-10-23: 1000 mL via INTRAVENOUS

## 2012-10-23 MED ORDER — PROPOFOL 10 MG/ML IV BOLUS
INTRAVENOUS | Status: DC | PRN
  Administered 2012-10-23: 200 mg via INTRAVENOUS

## 2012-10-23 MED ORDER — LACTATED RINGERS IR SOLN
Status: DC | PRN
  Administered 2012-10-23: 200 mL

## 2012-10-23 MED ORDER — PROMETHAZINE HCL 25 MG/ML IJ SOLN: 6.2500 mg | INTRAMUSCULAR | Status: DC | PRN

## 2012-10-23 SURGICAL SUPPLY — 42 items
APPLIER CLIP 5 13 M/L LIGAMAX5 (MISCELLANEOUS) ×2
APPLIER CLIP ROT 10 11.4 M/L (STAPLE)
BENZOIN TINCTURE PRP APPL 2/3 (GAUZE/BANDAGES/DRESSINGS) ×2 IMPLANT
CABLE HIGH FREQUENCY MONO STRZ (ELECTRODE) IMPLANT
CANISTER SUCTION 2500CC (MISCELLANEOUS) ×2 IMPLANT
CATH REDDICK CHOLANGI 4FR 50CM (CATHETERS) IMPLANT
CLIP APPLIE 5 13 M/L LIGAMAX5 (MISCELLANEOUS) ×1 IMPLANT
CLIP APPLIE ROT 10 11.4 M/L (STAPLE) IMPLANT
CLOTH BEACON ORANGE TIMEOUT ST (SAFETY) ×2 IMPLANT
COVER MAYO STAND STRL (DRAPES) ×2 IMPLANT
COVER SURGICAL LIGHT HANDLE (MISCELLANEOUS) ×2 IMPLANT
DECANTER SPIKE VIAL GLASS SM (MISCELLANEOUS) ×2 IMPLANT
DERMABOND ADVANCED (GAUZE/BANDAGES/DRESSINGS) ×1
DERMABOND ADVANCED .7 DNX12 (GAUZE/BANDAGES/DRESSINGS) ×1 IMPLANT
DRAPE C-ARM 42X72 X-RAY (DRAPES) ×2 IMPLANT
DRAPE LAPAROSCOPIC ABDOMINAL (DRAPES) ×2 IMPLANT
ELECT REM PT RETURN 9FT ADLT (ELECTROSURGICAL) ×2
ELECTRODE REM PT RTRN 9FT ADLT (ELECTROSURGICAL) ×1 IMPLANT
GLOVE BIOGEL M 8.0 STRL (GLOVE) ×2 IMPLANT
GOWN STRL NON-REIN LRG LVL3 (GOWN DISPOSABLE) ×2 IMPLANT
GOWN STRL REIN XL XLG (GOWN DISPOSABLE) ×4 IMPLANT
HEMOSTAT SURGICEL 4X8 (HEMOSTASIS) IMPLANT
IV CATH 14GX2 1/4 (CATHETERS) ×2 IMPLANT
KIT BASIN OR (CUSTOM PROCEDURE TRAY) ×2 IMPLANT
NS IRRIG 1000ML POUR BTL (IV SOLUTION) ×2 IMPLANT
POUCH SPECIMEN RETRIEVAL 10MM (ENDOMECHANICALS) ×2 IMPLANT
SCISSORS LAP 5X35 DISP (ENDOMECHANICALS) ×2 IMPLANT
SET IRRIG TUBING LAPAROSCOPIC (IRRIGATION / IRRIGATOR) ×2 IMPLANT
SLEEVE Z-THREAD 5X100MM (TROCAR) IMPLANT
SOLUTION ANTI FOG 6CC (MISCELLANEOUS) ×2 IMPLANT
STRIP CLOSURE SKIN 1/2X4 (GAUZE/BANDAGES/DRESSINGS) ×2 IMPLANT
SUT MNCRL AB 4-0 PS2 18 (SUTURE) ×2 IMPLANT
SUT VIC AB 4-0 SH 18 (SUTURE) ×2 IMPLANT
SYR 30ML LL (SYRINGE) ×2 IMPLANT
TOWEL OR 17X26 10 PK STRL BLUE (TOWEL DISPOSABLE) ×4 IMPLANT
TRAY LAP CHOLE (CUSTOM PROCEDURE TRAY) ×2 IMPLANT
TROCAR BLADELESS OPT 5 75 (ENDOMECHANICALS) ×4 IMPLANT
TROCAR XCEL BLUNT TIP 100MML (ENDOMECHANICALS) ×2 IMPLANT
TROCAR XCEL NON-BLD 11X100MML (ENDOMECHANICALS) IMPLANT
TROCAR Z-THREAD FIOS 11X100 BL (TROCAR) ×2 IMPLANT
TROCAR Z-THREAD FIOS 5X100MM (TROCAR) ×2 IMPLANT
TUBING INSUFFLATION 10FT LAP (TUBING) ×2 IMPLANT

## 2012-10-23 NOTE — Anesthesia Preprocedure Evaluation (Signed)

## 2012-10-23 NOTE — Transfer of Care (Signed)
Immediate Anesthesia Transfer of Care Note  Patient: Sandra [name]  Procedure(s) Performed: Procedure(s) (LRB) with comments: LAPAROSCOPIC CHOLECYSTECTOMY WITH INTRAOPERATIVE CHOLANGIOGRAM (N/A)  Patient Location: PACU  Anesthesia Type:General  Level of Consciousness: awake, sedated and patient cooperative  Airway & Oxygen Therapy: Patient Spontanous Breathing and Patient connected to face mask oxygen  Post-op Assessment: Report given to PACU RN and Post -op Vital signs reviewed and stable  Post vital signs: Reviewed and stable  Complications: No apparent anesthesia complications

## 2012-10-23 NOTE — Progress Notes (Signed)
Patient ID: Sandra [name], female   DOB: 09/14/1991, 22 y.o.   MRN: [id number] Brief visit to see if patient had any last minute questions before surgery today.  No complaints and no questions.  Ready for OR today.  Daman Steffenhagen 10/23/2012 7:52 AM

## 2012-10-23 NOTE — Op Note (Signed)
Surgeon: Wenda Low, MD, FACS  Asst:  Barnetta Chapel, PA  Anes:  General  Procedure: Laparoscopic cholecystectomy with intraoperative cholangiogram  Diagnosis: Small gallstones with history of pancreatitis;  IOC showed large duct with free flow into the duodenum-no filling defects noted.  BB sized stones palpated in gallbladder  Complications: none  EBL:   minimal cc  Description of Procedure:  Taken to OR 6; prepped with PCMX and draped sterilely.  Timeout performed.  Access to abdomen achieved with 5 mm Optiview 0 degree scope.  11 mm in the upper midline and two 5 mm lower.  Gallbladder elevated and anatomy delineated.  Critical view achieved.  Cholangiogram performed which showed large CBD and free flow into the duodenum.  Triple clip cystic duct and cystic artery.  Gallbladder removed without entering it with hook electrocautery.  Brought out from upper trocar in a bag.  Wound closed with 5-0 Monocryl and Dermabond.    Matt B. Daphine Deutscher, MD, [facility] Surgery, [place] [phone]

## 2012-10-23 NOTE — Anesthesia Postprocedure Evaluation (Signed)
  Anesthesia Post-op Note  Patient: Sandra [name]  Procedure(s) Performed: Procedure(s) (LRB): LAPAROSCOPIC CHOLECYSTECTOMY WITH INTRAOPERATIVE CHOLANGIOGRAM (N/A)  Patient Location: PACU  Anesthesia Type: General  Level of Consciousness: awake and alert   Airway and Oxygen Therapy: Patient Spontanous Breathing  Post-op Pain: mild  Post-op Assessment: Post-op Vital signs reviewed, Patient's Cardiovascular Status Stable, Respiratory Function Stable, Patent Airway and No signs of Nausea or vomiting  Last Vitals:  Filed Vitals:   10/23/12 1320  BP: 103/38  Pulse: 80  Temp:   Resp: 18    Post-op Vital Signs: stable   Complications: No apparent anesthesia complications

## 2012-10-23 NOTE — Interval H&P Note (Signed)
History and Physical Interval Note:  10/23/2012 10:36 AM  Sandra [name]  has presented today for surgery, with the diagnosis of cholelithiasis  The various methods of treatment have been discussed with the patient and family. After consideration of risks, benefits and other options for treatment, the patient has consented to  Procedure(s) (LRB) with comments: LAPAROSCOPIC CHOLECYSTECTOMY WITH INTRAOPERATIVE CHOLANGIOGRAM (N/A) as a surgical intervention .  The patient's history has been reviewed, patient examined, no change in status, stable for surgery.  I have reviewed the patient's chart and labs.  Questions were answered to the patient's satisfaction.     [name] B

## 2012-10-24 ENCOUNTER — Encounter (HOSPITAL_COMMUNITY): Payer: Self-pay | Admitting: Surgery

## 2012-10-24 LAB — CBC
HCT: 31.9 % — ABNORMAL LOW (ref 36.0–46.0)
MCV: 73.2 fL — ABNORMAL LOW (ref 78.0–100.0)
RDW: 17.9 % — ABNORMAL HIGH (ref 11.5–15.5)
WBC: 11.2 10*3/uL — ABNORMAL HIGH (ref 4.0–10.5)

## 2012-10-24 LAB — COMPREHENSIVE METABOLIC PANEL
ALT: 56 U/L — ABNORMAL HIGH (ref 0–35)
Albumin: 3 g/dL — ABNORMAL LOW (ref 3.5–5.2)
Alkaline Phosphatase: 85 U/L (ref 39–117)
BUN: 8 mg/dL (ref 6–23)
Potassium: 4.1 mEq/L (ref 3.5–5.1)
Sodium: 134 mEq/L — ABNORMAL LOW (ref 135–145)
Total Protein: 7 g/dL (ref 6.0–8.3)

## 2012-10-24 MED ORDER — OXYCODONE-ACETAMINOPHEN 5-325 MG PO TABS: 1.0000 | ORAL_TABLET | ORAL | Status: DC | PRN

## 2012-10-24 NOTE — Discharge Summary (Signed)
Patient ID: POCAHONTAS COHENOUR MRN: [id number] DOB/AGE: 1991/05/31 21 y.o.  Admit date: 10/20/2012 Discharge date: 10/24/2012  Procedures: laparoscopic cholecystectomy with IOC  Consults: None  Reason for Admission: we were asked to evaluate this patient for right upper quadrant pain and gallstones. She has known about her gallstones since December of last year she began having symptoms at that time during her pregnancy. She has had right upper quadrant sharp and stabbing pain about one time per week usually after eating meat. She has tried to get relief with Pepcid and Pepto-Bismol without any success. Her most recent episode began last night at about 6:30 PM after eating a cheeseburger. She describes this as right upper quadrant pain which is sharp and stabbing which was 10 out of 10 pain but now has improved since receiving pain medication. She had 5 episodes of vomiting without any blood. She says that her bowels are okay without any blood in the stools or melena. She denies any fevers or chills and denies any heartburn symptoms.  Admission Diagnoses:  1. Biliary pancreatitis  Hospital Course: the patient was admitted and given bowel rest and IVFs.  Once her pancreatitis resolved, she was taken to the operating room where she underwent a lap chole with IOC.  She tolerated the procedure well.  She was stable for dc home on POD# 1 while tolerating a regular diet and oral pain medications.  PE: Abd: soft, appropriately tender, +BS, ND, incisions c/d/i  Discharge Diagnoses:  1. Gallstone pancreatitis 2. S/p lap chole  Discharge Medications:   Medication List     As of 10/24/2012  9:31 AM    TAKE these medications         multivitamin with minerals Tabs   Take 1 tablet by mouth daily.      oxyCODONE-acetaminophen 5-325 MG per tablet   Commonly known as: PERCOCET/ROXICET   Take 1-2 tablets by mouth every 4 (four) hours as needed.        Discharge Instructions:     Follow-up  Information    Follow up with [facility]. On 11/12/2012. (11:30am, arrive at 11:00am)    Contact information:   [address]   [place] [address] [phone]          Signed: Letha Cape 10/24/2012, 9:31 AM

## 2012-11-12 ENCOUNTER — Encounter (INDEPENDENT_AMBULATORY_CARE_PROVIDER_SITE_OTHER): Payer: Self-pay

## 2013-08-10 ENCOUNTER — Encounter (HOSPITAL_COMMUNITY): Payer: Self-pay | Admitting: Emergency Medicine

## 2013-08-10 ENCOUNTER — Emergency Department (HOSPITAL_COMMUNITY): Payer: Medicaid Other

## 2013-08-10 ENCOUNTER — Emergency Department (HOSPITAL_COMMUNITY)
Admission: EM | Admit: 2013-08-10 | Discharge: 2013-08-10 | Disposition: A | Discharge status: Home / Self Care | Payer: Medicaid Other | Attending: Emergency Medicine | Admitting: Emergency Medicine

## 2013-08-10 DIAGNOSIS — IMO0001 Reserved for inherently not codable concepts without codable children: Secondary | ICD-10-CM | POA: Insufficient documentation

## 2013-08-10 DIAGNOSIS — Z79899 Other long term (current) drug therapy: Secondary | ICD-10-CM | POA: Insufficient documentation

## 2013-08-10 DIAGNOSIS — Z3202 Encounter for pregnancy test, result negative: Secondary | ICD-10-CM | POA: Insufficient documentation

## 2013-08-10 DIAGNOSIS — R05 Cough: Secondary | ICD-10-CM | POA: Insufficient documentation

## 2013-08-10 DIAGNOSIS — R112 Nausea with vomiting, unspecified: Secondary | ICD-10-CM | POA: Insufficient documentation

## 2013-08-10 DIAGNOSIS — J3489 Other specified disorders of nose and nasal sinuses: Secondary | ICD-10-CM | POA: Insufficient documentation

## 2013-08-10 DIAGNOSIS — J209 Acute bronchitis, unspecified: Secondary | ICD-10-CM | POA: Insufficient documentation

## 2013-08-10 DIAGNOSIS — J40 Bronchitis, not specified as acute or chronic: Secondary | ICD-10-CM

## 2013-08-10 DIAGNOSIS — R059 Cough, unspecified: Secondary | ICD-10-CM | POA: Insufficient documentation

## 2013-08-10 LAB — CBC WITH DIFFERENTIAL/PLATELET
Basophils Absolute: 0 10*3/uL (ref 0.0–0.1)
Basophils Relative: 0 % (ref 0–1)
Eosinophils Absolute: 0.1 10*3/uL (ref 0.0–0.7)
MCH: 25.8 pg — ABNORMAL LOW (ref 26.0–34.0)
MCHC: 31.6 g/dL (ref 30.0–36.0)
Neutrophils Relative %: 48 % (ref 43–77)
Platelets: 232 10*3/uL (ref 150–400)

## 2013-08-10 LAB — URINALYSIS, ROUTINE W REFLEX MICROSCOPIC
Bilirubin Urine: NEGATIVE
Hgb urine dipstick: NEGATIVE
Nitrite: NEGATIVE
Protein, ur: NEGATIVE mg/dL
Specific Gravity, Urine: 1.031 — ABNORMAL HIGH (ref 1.005–1.030)
Urobilinogen, UA: 0.2 mg/dL (ref 0.0–1.0)

## 2013-08-10 LAB — POCT PREGNANCY, URINE: Preg Test, Ur: NEGATIVE

## 2013-08-10 LAB — COMPREHENSIVE METABOLIC PANEL
CO2: 24 mEq/L (ref 19–32)
Calcium: 9.5 mg/dL (ref 8.4–10.5)
Chloride: 104 mEq/L (ref 96–112)
Creatinine, Ser: 0.72 mg/dL (ref 0.50–1.10)
GFR calc Af Amer: >90 mL/min (ref >90)
GFR calc non Af Amer: >90 mL/min (ref >90)
Glucose, Bld: 119 mg/dL — ABNORMAL HIGH (ref 70–99)
Sodium: 139 mEq/L (ref 135–145)
Total Bilirubin: 0.2 mg/dL — ABNORMAL LOW (ref 0.3–1.2)

## 2013-08-10 LAB — LIPASE, BLOOD: Lipase: 20 U/L (ref 11–59)

## 2013-08-10 MED ORDER — AZITHROMYCIN 250 MG PO TABS: 250.0000 mg | ORAL_TABLET | Freq: Every day | ORAL | Status: DC

## 2013-08-10 MED ORDER — ONDANSETRON HCL 4 MG PO TABS: 4.0000 mg | ORAL_TABLET | Freq: Four times a day (QID) | ORAL | Status: DC

## 2013-08-10 MED ORDER — GUAIFENESIN 100 MG/5ML PO LIQD: 100.0000 mg | ORAL | Status: DC | PRN

## 2013-08-10 MED ORDER — ONDANSETRON HCL 4 MG/2ML IJ SOLN
4.0000 mg | Freq: Once | INTRAMUSCULAR | Status: AC
  Administered 2013-08-10: 4 mg via INTRAVENOUS
  Filled 2013-08-10: qty 2

## 2013-08-10 MED ORDER — SODIUM CHLORIDE 0.9 % IV SOLN
1000.0000 mL | Freq: Once | INTRAVENOUS | Status: AC
  Administered 2013-08-10: 1000 mL via INTRAVENOUS

## 2013-08-10 NOTE — ED Provider Notes (Signed)
CSN: [id number]     Arrival date & time 08/10/13  1341 History   First MD Initiated Contact with Patient 08/10/13 1354     Chief Complaint  Patient presents with  . Shortness of Breath  . Emesis   (Consider location/radiation/quality/duration/timing/severity/associated sxs/prior Treatment) HPI Comments: Patient reports 2 week history of upper respiratory symptoms including cough, congestion, fatigue and body aches. Cough is productive of clear phlegm. Over the past several days she become more short of breath today she started having some posttussive emesis. Denies any fevers, chills, recent travel or sick contacts. Denies any leg pain or leg swelling. Denies abdominal pain or diarrhea. Good by mouth intake and urine output. No chest pain.  Has implanon.   The history is provided by the patient.    Past Medical History  Diagnosis Date  . History of blood transfusion   . Hemorrhage after delivery of fetus     Intraoperative, received blood transfusion  . Anemia   . Hx of ectopic pregnancy    Past Surgical History  Procedure Laterality Date  . Cesarean section    . Laparoscopy for ectopic pregnancy    . Cesarean section  04/23/2012    Procedure: CESAREAN SECTION;  Surgeon: Reva Bores, MD;  Location: [facility];  Service: Gynecology;  Laterality: N/A;  . Cholecystectomy  10/23/2012    Procedure: LAPAROSCOPIC CHOLECYSTECTOMY WITH INTRAOPERATIVE CHOLANGIOGRAM;  Surgeon: Valarie Merino, MD;  Location: [facility];  Service: General;  Laterality: N/A;   Family History  Problem Relation Age of Onset  . Other Neg Hx   . High blood pressure Mother   . Diabetes Mellitus II Father    History  Substance Use Topics  . Smoking status: Never Smoker   . Smokeless tobacco: Never Used  . Alcohol Use: No   OB History   Grav Para Term Preterm Abortions TAB SAB Ect Mult Living   3 2 2  1   1  2      Review of Systems  Constitutional: Positive for activity change, appetite change and fatigue.  Negative for fever.  HENT: Positive for congestion, rhinorrhea and sinus pressure.   Respiratory: Positive for cough and shortness of breath. Negative for chest tightness.   Cardiovascular: Negative for chest pain.  Gastrointestinal: Positive for nausea and vomiting. Negative for abdominal pain and diarrhea.  Genitourinary: Negative for dysuria and hematuria.  Musculoskeletal: Positive for arthralgias and myalgias. Negative for back pain.  Skin: Negative for rash.  Neurological: Positive for weakness. Negative for dizziness and headaches.    Allergies  Shellfish allergy  Home Medications   Current Outpatient Rx  Name  Route  Sig  Dispense  Refill  . Dextromethorphan HBr (VICKS DAYQUIL COUGH) 15 MG/15ML LIQD   Oral   Take 30 mLs by mouth every 6 (six) hours as needed (cough).         . DM-Doxylamine-Acetaminophen (NYQUIL COLD & FLU PO)   Oral   Take 30 mLs by mouth at bedtime as needed (cough).         . Multiple Vitamin (MULTIVITAMIN WITH MINERALS) TABS   Oral   Take 1 tablet by mouth daily.         Marland Kitchen azithromycin (ZITHROMAX) 250 MG tablet   Oral   Take 1 tablet (250 mg total) by mouth daily. Take first 2 tablets together, then 1 every day until finished.   6 tablet   0   . guaiFENesin (ROBITUSSIN) 100 MG/5ML liquid  Oral   Take 5-10 mLs (100-200 mg total) by mouth every 4 (four) hours as needed for cough.   60 mL   0   . ondansetron (ZOFRAN) 4 MG tablet   Oral   Take 1 tablet (4 mg total) by mouth every 6 (six) hours.   12 tablet   0    BP 147/68  Pulse 99  Temp(Src) 98.6 F (37 C) (Oral)  Resp 20  SpO2 100% Physical Exam  Constitutional: She is oriented to person, place, and time. She appears well-developed and well-nourished. No distress.  HENT:  Head: Normocephalic and atraumatic.  Mouth/Throat: Oropharynx is clear and moist. No oropharyngeal exudate.  Congestion, no sinus tenderness  Eyes: Conjunctivae and EOM are normal. Pupils are equal,  round, and reactive to light.  Neck: Normal range of motion. Neck supple.  No meningismus  Cardiovascular: Normal rate, regular rhythm and normal heart sounds.   No murmur heard. Pulmonary/Chest: Effort normal and breath sounds normal. No respiratory distress. She has no wheezes.  Abdominal: Soft. There is no tenderness. There is no rebound and no guarding.  Musculoskeletal: Normal range of motion. She exhibits no edema and no tenderness.  Neurological: She is alert and oriented to person, place, and time. No cranial nerve deficit. She exhibits normal muscle tone. Coordination normal.  Skin: Skin is warm.    ED Course  Procedures (including critical care time) Labs Review Labs Reviewed  CBC WITH DIFFERENTIAL - Abnormal; Notable for the following:    MCH 25.8 (*)    All other components within normal limits  COMPREHENSIVE METABOLIC PANEL - Abnormal; Notable for the following:    Glucose, Bld 119 (*)    Total Bilirubin 0.2 (*)    All other components within normal limits  URINALYSIS, ROUTINE W REFLEX MICROSCOPIC - Abnormal; Notable for the following:    APPearance CLOUDY (*)    Specific Gravity, Urine 1.031 (*)    All other components within normal limits  LIPASE, BLOOD  POCT PREGNANCY, URINE   Imaging Review Dg Chest 2 View (if Patient Has Fever And/or Copd)  08/10/2013   CLINICAL DATA:  Productive cough for the past 3 weeks. Nausea, vomiting and shortness of breath  EXAM: CHEST  2 VIEW  COMPARISON:  Chest x-ray 09/09/2012.  FINDINGS: Lung volumes are normal. No consolidative airspace disease. No pleural effusions. No pneumothorax. No pulmonary nodule or mass noted. Pulmonary vasculature and the cardiomediastinal silhouette are within normal limits. Surgical clips project over the right upper quadrant of the abdomen, likely from prior cholecystectomy.  IMPRESSION: 1.  No radiographic evidence of acute cardiopulmonary disease.   Electronically Signed   By: Trudie Reed M.D.   On:  08/10/2013 14:29    EKG Interpretation   None       MDM   1. Bronchitis    2 weeks of URI symptoms with cough, congestion, today with SOB and posttussive emesis. No abdominal pain or chest pain.  No leg pain or swelling.  No distress, lungs clear, abdomen soft. Low suspicion for PE.  Patient has cough, congestion.  No chest pain or hypoxia. CXR negative. Labs at baseline. Tolerating PO in the ED.  Will treat for bronchitis given length of illness.   BP 147/68  Pulse 99  Temp(Src) 98.6 F (37 C) (Oral)  Resp 20  SpO2 100%    Glynn Octave, MD 08/10/13 1529

## 2013-08-10 NOTE — ED Notes (Signed)
Pt c/o cold 2 3 weeks, states getting worse, vomiting, unable to take po, SOB when lying flat.

## 2014-01-13 ENCOUNTER — Ambulatory Visit (INDEPENDENT_AMBULATORY_CARE_PROVIDER_SITE_OTHER): Payer: Medicaid Other | Admitting: Family Medicine

## 2014-01-13 ENCOUNTER — Encounter: Payer: Self-pay | Admitting: Family Medicine

## 2014-01-13 VITALS — BP 129/81 | HR 90 | Temp 99.0°F | Ht 59.0 in | Wt 193.0 lb

## 2014-01-13 DIAGNOSIS — E669 Obesity, unspecified: Secondary | ICD-10-CM

## 2014-01-13 DIAGNOSIS — F410 Panic disorder [episodic paroxysmal anxiety] without agoraphobia: Secondary | ICD-10-CM

## 2014-01-13 DIAGNOSIS — Z0000 Encounter for general adult medical examination without abnormal findings: Secondary | ICD-10-CM

## 2014-01-13 DIAGNOSIS — Z975 Presence of (intrauterine) contraceptive device: Secondary | ICD-10-CM | POA: Insufficient documentation

## 2014-01-13 DIAGNOSIS — R196 Halitosis: Secondary | ICD-10-CM

## 2014-01-13 DIAGNOSIS — R6889 Other general symptoms and signs: Secondary | ICD-10-CM

## 2014-01-13 DIAGNOSIS — Z113 Encounter for screening for infections with a predominantly sexual mode of transmission: Secondary | ICD-10-CM | POA: Insufficient documentation

## 2014-01-13 LAB — GLUCOSE, CAPILLARY: GLUCOSE-CAPILLARY: 121 mg/dL — AB (ref 70–99)

## 2014-01-13 NOTE — Patient Instructions (Signed)
Calorie Counting Diet A calorie counting diet requires you to eat the number of calories that are right for you in a day. Calories are the measurement of how much energy you get from the food you eat. Eating the right amount of calories is important for staying at a healthy weight. If you eat too many calories, your body will store them as fat and you may gain weight. If you eat too few calories, you may lose weight. Counting the number of calories you eat during a day will help you know if you are eating the right amount. A Registered Dietitian can determine how many calories you need in a day. The amount of calories needed varies from person to person. If your goal is to lose weight, you will need to eat fewer calories. Losing weight can benefit you if you are overweight or have health problems such as heart disease, high blood pressure, or diabetes. If your goal is to gain weight, you will need to eat more calories. Gaining weight may be necessary if you have a certain health problem that causes your body to need more energy. TIPS Whether you are increasing or decreasing the number of calories you eat during a day, it may be hard to get used to changes in what you eat and drink. The following are tips to help you keep track of the number of calories you eat.  Measure foods at home with measuring cups. This helps you know the amount of food and number of calories you are eating.  Restaurants often serve food in amounts that are larger than 1 serving. While eating out, estimate how many servings of a food you are given. For example, a serving of cooked rice is  cup or about the size of half of a fist. Knowing serving sizes will help you be aware of how much food you are eating at restaurants.  Ask for smaller portion sizes or child-size portions at restaurants.  Plan to eat half of a meal at a restaurant. Take the rest home or share the other half with a friend.  Read the Nutrition Facts panel on  food labels for calorie content and serving size. You can find out how many servings are in a package, the size of a serving, and the number of calories each serving has.  For example, a package might contain 3 cookies. The Nutrition Facts panel on that package says that 1 serving is 1 cookie. Below that, it will say there are 3 servings in the container. The calories section of the Nutrition Facts label says there are 90 calories. This means there are 90 calories in 1 cookie (1 serving). If you eat 1 cookie you have eaten 90 calories. If you eat all 3 cookies, you have eaten 270 calories (3 servings x 90 calories = 270 calories). The list below tells you how big or small some common portion sizes are.  1 oz.........4 stacked dice.  3 oz.........Deck of cards.  1 tsp........Tip of little finger.  1 tbs........Thumb.  2 tbs........Golf ball.   cup.......Half of a fist.  1 cup........A fist. KEEP A FOOD LOG Write down every food item you eat, the amount you eat, and the number of calories in each food you eat during the day. At the end of the day, you can add up the total number of calories you have eaten. It may help to keep a list like the one below. Find out the calorie information by reading the   Nutrition Facts panel on food labels. Breakfast  Bran cereal (1 cup, 110 calories).  Fat-free milk ( cup, 45 calories). Snack  Apple (1 medium, 80 calories). Lunch  Spinach (1 cup, 20 calories).  Tomato ( medium, 20 calories).  Chicken breast strips (3 oz, 165 calories).  Shredded cheddar cheese ( cup, 110 calories).  Light Italian dressing (2 tbs, 60 calories).  Whole-wheat bread (1 slice, 80 calories).  Tub margarine (1 tsp, 35 calories).  Vegetable soup (1 cup, 160 calories). Dinner  Pork chop (3 oz, 190 calories).  Brown rice (1 cup, 215 calories).  Steamed broccoli ( cup, 20 calories).  Strawberries (1  cup, 65 calories).  Whipped cream (1 tbs, 50  calories). Daily Calorie Total: 1425 Document Released: 09/04/2005 Document Revised: 11/27/2011 Document Reviewed: 03/01/2007 [employer] Patient Information 2014 [employer].  

## 2014-01-13 NOTE — Progress Notes (Signed)
Subjective:    Patient ID: Sandra [name], female    DOB: 1990/10/11, 23 y.o.   MRN: [id number]  HPI  Sandra [name] is 23 y.o. female [facility] for new patient establishment.  Halitosis: Patient reports she is having difficulties with her employment do to her "bad breath". She states she has been to the dentist to address these issues and was given chlorhexidine mouthwash. She has been brushing her teeth 3-4 times a day and gargling with mouthwash 2 times a day. She does have her tonsils. She is concerned her losing her job but doesn't know what else to do about her breath. She denies frequent sinus infection or current sinus infection.  Well women: Patient reports she has never had a Pap smear. She is a gravida 3 para 2 , with one tubal pregnancy on the right. She had to have surgical removal for tubal pregnancy. She was [redacted] weeks pregnant at that time. She did not have difficulty conceiving after tubal pregnancy. Her first child was born 2009 and was a C-section for breech. Her other child was born in 2013 it was a repeat C-section. She reportedly still has her right tube. She currently uses the Nexplanon for birth control. It was placed in December 2013. Patient reports she has periods every 4-5 months on the Nexplanon. Last menstrual cycle was in January. She has had unprotected sex. She is not in a relationship at this time.  Anxiety: Patient reports she has a history of panic attacks. She has never been medicated for her attacks and does not desire to be medicated this time. She states she gets palpitations, difficulty breathing and feels dizzy with her attacks.  Nutirition: Patient reports she lost 20 pounds last year when she started exercise. She has since regained all of that back with an additional few pounds. She is concerned that she is becoming a diabetic. She endorses polydipsia, denies polyphagia or polyuria. Has family members that are type II  diabetics. She is uncertain how to calculate proper calorie intake and is uncertain of a healthy diet. She was like some guidance on her nutrition to be able to become healthy and stay healthy.  Past medical history: Anemia in 2012. Anxiety since 2005. Although removal in 2014. C-section in 2009 2013. Patient takes multivitamin with C. and E.  Family history: Patient has a family history of heart disease with her aunts. Depression with her mother. diabetes with her father grandparents and aunts. Her mother died at age 24 from an aneurysm. Her father had high cholesterol. Mother had high blood pressure. Her father had alcohol abuse. mother had drug abuse.  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 She is currently not in a relationship but prefers female partners. She is a G3 para 2012. She lives with a friend her daughters and her grandma. She requires either her friend to the bus system for transportation. She exercises regularly 3 times a week for strength training and cardio. She has never used tobacco. Denies recreational drugs. Occasional alcohol.   Review of Systems  Constitutional: Positive for appetite change. Negative for fever, chills, diaphoresis, activity change, fatigue and unexpected weight change.  HENT: Positive for dental problem. Negative for congestion, drooling, ear discharge, ear pain, facial swelling, nosebleeds, postnasal drip, rhinorrhea, sneezing and sore throat.   Eyes: Negative for photophobia, pain, redness and visual disturbance.  Respiratory: Negative for cough, choking, chest tightness and shortness of breath.   Cardiovascular:  Negative for chest pain, palpitations and leg swelling.  Gastrointestinal: Positive for diarrhea and constipation. Negative for nausea and abdominal pain.  Endocrine: Positive for polydipsia. Negative for polyphagia and polyuria.  Genitourinary: Negative for dysuria, frequency, hematuria, flank pain, vaginal bleeding, vaginal  discharge, enuresis and pelvic pain.  Musculoskeletal: Negative.   Skin: Negative.   Allergic/Immunologic: Positive for food allergies.  Neurological: Negative for dizziness, syncope, facial asymmetry, numbness and headaches.  Hematological: Negative.   Psychiatric/Behavioral: The patient is nervous/anxious.    Negative, with the exception of above mentioned in HPI    Objective:   Physical Exam BP 129/81  Pulse 90  Temp(Src) 99 F (37.2 C) (Oral)  Ht 4\' 11"  (1.499 m)  Wt 193 lb (87.544 kg)  BMI 38.96 kg/m2  LMP 09/26/2013 Gen: Pleasant, obese African American female. Pleasant. No acute distress. Quiet. HEENT: AT. [place]. Bilateral TM visualized and normal in appearance. Bilateral eyes without injections or icterus. MMM. Bilateral nares with mild erythema. Throat without erythema or exudates. Right tonsil cryptic. Neck: Supple, no lymphadenopathy, no thyromegaly CV: RRR, no murmurs appreciated. Chest: CTAB, no wheeze or crackles Abd: Soft. Obese . NTND. BS present. No Masses palpated.  Ext: No erythema. No edema.  Skin: No rashes, purpura or petechiae.  Neuro:  Normal gait. PERLA. EOMi. Alert. Grossly intact.  Psych: Patient quiet. Otherwise normal affect, mood, demeanor and speech

## 2014-01-13 NOTE — Assessment & Plan Note (Signed)
Blood glucose today 121. Will obtain fasting blood glucose at patient's convenience, future order is placed today, to screen for diabetes due to patient's concern and polydipsia. Nutrition referral placed today. Patient needs guidance on proper caloric intake for her specifically. She also needs education on healthy diet and portion control.

## 2014-01-13 NOTE — Assessment & Plan Note (Signed)
Patient has never had a Pap smear. She is to schedule one for future day on her way out today. On that day she will also be tested for gonorrhea and Chlamydia. Low suspicion with this however she is requesting to be checked. Would like to have PHQ you in GAD assessment completed on the appointment as well.

## 2014-01-13 NOTE — Assessment & Plan Note (Signed)
Will refer patient to ENT today. It appears patient has great dental hygiene, has been completely checked out by a dentist without cause for halitosis. Patient may have cryptic tonsils which is causing her sour breath. Will provide work excuse if needed.

## 2014-01-13 NOTE — Assessment & Plan Note (Signed)
Patient has decided not to use medications at this time.  Followup when necessary

## 2014-01-13 NOTE — Assessment & Plan Note (Signed)
Implanon in place. Will be due for removal or replacement November 2016.

## 2014-01-16 ENCOUNTER — Other Ambulatory Visit: Payer: Medicaid Other

## 2014-01-23 ENCOUNTER — Other Ambulatory Visit: Payer: Medicaid Other

## 2014-01-23 DIAGNOSIS — E669 Obesity, unspecified: Secondary | ICD-10-CM

## 2014-01-23 NOTE — Progress Notes (Signed)
FASTING GLUCOSE DONE TODAY Sandra [name]

## 2014-01-24 LAB — GLUCOSE, FASTING: GLUCOSE, FASTING: 90 mg/dL (ref 70–99)

## 2014-01-26 ENCOUNTER — Telehealth: Payer: Self-pay | Admitting: Family Medicine

## 2014-01-26 NOTE — Telephone Encounter (Signed)
Relayed message,patient voiced understanding.Sandra [name] S Sandra [name]  

## 2014-01-26 NOTE — Telephone Encounter (Signed)
Please call Victorino DikeJennifer and inform her the results of her fasting blood sugar was normal. She does NOT have diabetes. Thanks.

## 2014-01-30 ENCOUNTER — Ambulatory Visit: Payer: Medicaid Other | Admitting: Family Medicine

## 2014-02-25 ENCOUNTER — Other Ambulatory Visit: Payer: Self-pay | Admitting: Otolaryngology

## 2014-03-12 ENCOUNTER — Ambulatory Visit: Payer: Medicaid Other | Admitting: Family Medicine

## 2014-07-20 ENCOUNTER — Encounter: Payer: Self-pay | Admitting: Family Medicine

## 2014-09-07 ENCOUNTER — Ambulatory Visit: Payer: Medicaid Other | Admitting: Family Medicine

## 2016-02-27 ENCOUNTER — Ambulatory Visit (HOSPITAL_COMMUNITY)
Admission: EM | Admit: 2016-02-27 | Discharge: 2016-02-27 | Disposition: A | Discharge status: Home / Self Care | Payer: Medicaid Other | Attending: Emergency Medicine | Admitting: Emergency Medicine

## 2016-02-27 ENCOUNTER — Encounter (HOSPITAL_COMMUNITY): Payer: Self-pay | Admitting: *Deleted

## 2016-02-27 DIAGNOSIS — M545 Low back pain, unspecified: Secondary | ICD-10-CM

## 2016-02-27 DIAGNOSIS — R7309 Other abnormal glucose: Secondary | ICD-10-CM | POA: Diagnosis not present

## 2016-02-27 DIAGNOSIS — R81 Glycosuria: Secondary | ICD-10-CM

## 2016-02-27 LAB — POCT URINALYSIS DIP (DEVICE)
Bilirubin Urine: NEGATIVE
Glucose, UA: 500 mg/dL — AB
HGB URINE DIPSTICK: NEGATIVE
KETONES UR: 15 mg/dL — AB
Leukocytes, UA: NEGATIVE
Nitrite: NEGATIVE
PH: 5.5 (ref 5.0–8.0)
PROTEIN: 30 mg/dL — AB
Specific Gravity, Urine: 1.02 (ref 1.005–1.030)
Urobilinogen, UA: 0.2 mg/dL (ref 0.0–1.0)

## 2016-02-27 LAB — GLUCOSE, CAPILLARY: Glucose-Capillary: 261 mg/dL — ABNORMAL HIGH (ref 65–99)

## 2016-02-27 LAB — POCT PREGNANCY, URINE: PREG TEST UR: NEGATIVE

## 2016-02-27 MED ORDER — NAPROXEN SODIUM 550 MG PO TABS: 550.0000 mg | ORAL_TABLET | Freq: Two times a day (BID) | ORAL | Status: DC

## 2016-02-27 NOTE — Discharge Instructions (Signed)
Back Pain, Adult °Back pain is very common in adults. The cause of back pain is rarely dangerous and the pain often gets better over time. The cause of your back pain may not be known. Some common causes of back pain include: °· Strain of the muscles or ligaments supporting the spine. °· Wear and tear (degeneration) of the spinal disks. °· Arthritis. °· Direct injury to the back. °For many people, back pain may return. Since back pain is rarely dangerous, most people can learn to manage this condition on their own. °HOME CARE INSTRUCTIONS °Watch your back pain for any changes. The following actions may help to lessen any discomfort you are feeling: °· Remain active. It is stressful on your back to sit or stand in one place for long periods of time. Do not sit, drive, or stand in one place for more than 30 minutes at a time. Take short walks on even surfaces as soon as you are able. Try to increase the length of time you walk each day. °· Exercise regularly as directed by your health care provider. Exercise helps your back heal faster. It also helps avoid future injury by keeping your muscles strong and flexible. °· Do not stay in bed. Resting more than 1-2 days can delay your recovery. °· Pay attention to your body when you bend and lift. The most comfortable positions are those that put less stress on your recovering back. Always use proper lifting techniques, including: °· Bending your knees. °· Keeping the load close to your body. °· Avoiding twisting. °· Find a comfortable position to sleep. Use a firm mattress and lie on your side with your knees slightly bent. If you lie on your back, put a pillow under your knees. °· Avoid feeling anxious or stressed. Stress increases muscle tension and can worsen back pain. It is important to recognize when you are anxious or stressed and learn ways to manage it, such as with exercise. °· Take medicines only as directed by your health care provider. Over-the-counter  medicines to reduce pain and inflammation are often the most helpful. Your health care provider may prescribe muscle relaxant drugs. These medicines help dull your pain so you can more quickly return to your normal activities and healthy exercise. °· Apply ice to the injured area: °· Put ice in a plastic bag. °· Place a towel between your skin and the bag. °· Leave the ice on for 20 minutes, 2-3 times a day for the first 2-3 days. After that, ice and heat may be alternated to reduce pain and spasms. °· Maintain a healthy weight. Excess weight puts extra stress on your back and makes it difficult to maintain good posture. °SEEK MEDICAL CARE IF: °· You have pain that is not relieved with rest or medicine. °· You have increasing pain going down into the legs or buttocks. °· You have pain that does not improve in one week. °· You have night pain. °· You lose weight. °· You have a fever or chills. °SEEK IMMEDIATE MEDICAL CARE IF:  °· You develop new bowel or bladder control problems. °· You have unusual weakness or numbness in your arms or legs. °· You develop nausea or vomiting. °· You develop abdominal pain. °· You feel faint. °  °This information is not intended to replace advice given to you by your health care provider. Make sure you discuss any questions you have with your health care provider. °  °Document Released: 09/04/2005 Document Revised: 09/25/2014 Document Reviewed: 01/06/2014 °Elsevier Interactive Patient Education ©2016 Elsevier   Inc. Hyperglycemia High blood sugar (hyperglycemia) means that the level of sugar in your blood is higher than it should be. Signs of high blood sugar include:  Feeling thirsty.  Frequent peeing (urinating).  Feeling tired or sleepy.  Dry mouth.  Vision changes.  Feeling weak.  Feeling hungry but losing weight.  Numbness and tingling in your hands or feet.  Headache. When you ignore these signs, your blood sugar may keep going up. These problems may get  worse, and other problems may begin. HOME CARE  Check your blood sugars as told by your doctor. Write down the numbers with the date and time.  Take the right amount of insulin or diabetes pills at the right time. Write down the dose with date and time.  Refill your insulin or diabetes pills before running out.  Watch what you eat. Follow your meal plan.  Drink liquids without sugar, such as water. Check with your doctor if you have kidney or heart disease.  Follow your doctor's orders for exercise. Exercise at the same time of day.  Keep your doctor's appointments. GET HELP RIGHT AWAY IF:   You have trouble thinking or are confused.  You have fast breathing with fruity smelling breath.  You pass out (faint).  You have 2 to 3 days of high blood sugars and you do not know why.  You have chest pain.  You are feeling sick to your stomach (nauseous) or throwing up (vomiting).  You have sudden vision changes. MAKE SURE YOU:   Understand these instructions.  Will watch your condition.  Will get help right away if you are not doing well or get worse.   This information is not intended to replace advice given to you by your health care provider. Make sure you discuss any questions you have with your health care provider.   Document Released: 07/02/2009 Document Revised: 09/25/2014 Document Reviewed: 05/11/2015 Elsevier Interactive Patient Education [employer]2016 Elsevier Inc.

## 2016-02-27 NOTE — ED Notes (Signed)
C/O right flank pain, polyuria, and vomiting x 3 days without fever.

## 2016-02-27 NOTE — ED Provider Notes (Signed)
CSN: [id number]     Arrival date & time 02/27/16  1349 History   First MD Initiated Contact with Patient 02/27/16 1353     Chief Complaint  Patient presents with  . Flank Pain   (Consider location/radiation/quality/duration/timing/severity/associated sxs/prior Treatment) HPI History obtained from patient:  Pt presents with the cc of:  Back pain, episode of vomiting Duration of symptoms: 2-3 days Treatment prior to arrival: occasional ibuprofen Context: sudden onset of pain, right low back. Also very thirsty and increased frequency of urination.  Other symptoms include: episode of vomiting this morning Pain score: 3 FAMILY HISTORY: HTN-mother, DM-father    Past Medical History  Diagnosis Date  . History of blood transfusion   . Hemorrhage after delivery of fetus     Intraoperative, received blood transfusion  . Anemia   . Hx of ectopic pregnancy    Past Surgical History  Procedure Laterality Date  . Cesarean section    . Laparoscopy for ectopic pregnancy    . Cesarean section  04/23/2012    Procedure: CESAREAN SECTION;  Surgeon: Reva Bores, MD;  Location: [facility];  Service: Gynecology;  Laterality: N/A;  . Cholecystectomy  10/23/2012    Procedure: LAPAROSCOPIC CHOLECYSTECTOMY WITH INTRAOPERATIVE CHOLANGIOGRAM;  Surgeon: Valarie Merino, MD;  Location: [facility];  Service: General;  Laterality: N/A;   Family History  Problem Relation Age of Onset  . Other Neg Hx   . High blood pressure Mother   . Aneurysm Mother   . Hypertension Mother   . Drug abuse Mother   . Diabetes Mellitus II Father   . Hyperlipidemia Father   . Alcohol abuse Father   . Heart disease Maternal Aunt   . Diabetes Paternal Grandmother   . Diabetes Paternal Grandfather    Social History  Substance Use Topics  . Smoking status: Current Some Day Smoker    Types: Cigars  . Smokeless tobacco: Never Used  . Alcohol Use: Yes     Comment: occasionally   OB History    Gravida Para Term Preterm AB TAB  SAB Ectopic Multiple Living   Review of Systems  Denies: HEADACHE, NAUSEA, ABDOMINAL PAIN, CHEST PAIN, CONGESTION, DYSURIA, SHORTNESS OF BREATH  Allergies  Shellfish allergy  Home Medications   Prior to Admission medications   Medication Sig Start Date End Date Taking? Authorizing Provider  azithromycin (ZITHROMAX) 250 MG tablet Take 1 tablet (250 mg total) by mouth daily. Take first 2 tablets together, then 1 every day until finished. 08/10/13   Glynn Octave, MD  Dextromethorphan HBr (VICKS DAYQUIL COUGH) 15 MG/15ML LIQD Take 30 mLs by mouth every 6 (six) hours as needed (cough).    Historical Provider, MD  DM-Doxylamine-Acetaminophen (NYQUIL COLD & FLU PO) Take 30 mLs by mouth at bedtime as needed (cough).    Historical Provider, MD  guaiFENesin (ROBITUSSIN) 100 MG/5ML liquid Take 5-10 mLs (100-200 mg total) by mouth every 4 (four) hours as needed for cough. 08/10/13   Glynn Octave, MD  Multiple Vitamin (MULTIVITAMIN WITH MINERALS) TABS Take 1 tablet by mouth daily.    Historical Provider, MD  naproxen sodium (ANAPROX DS) 550 MG tablet Take 1 tablet (550 mg total) by mouth 2 (two) times daily with a meal. 02/27/16   Tharon Aquas, PA  ondansetron (ZOFRAN) 4 MG tablet Take 1 tablet (4 mg total) by mouth every 6 (six) hours. 08/10/13   Glynn Octave, MD  Meds Ordered and Administered this Visit  Medications - No data to display  BP 163/83 mmHg  Pulse 98  Temp(Src) 98.6 F (37 C) (Oral)  Resp 16  SpO2 98%  LMP 02/17/2016 (Exact Date) No data found.   Physical Exam NURSES NOTES AND VITAL SIGNS REVIEWED. CONSTITUTIONAL: Well developed, well nourished, no acute distress HEENT: normocephalic, atraumatic EYES: Conjunctiva normal NECK:normal ROM, supple, no adenopathy PULMONARY:No respiratory distress, normal effort ABDOMINAL: Soft, ND, NT BS+, No CVAT MUSCULOSKELETAL: Normal ROM of all extremities, right lumbar tenderness to palpation.  SKIN: warm  and dry without rash PSYCHIATRIC: Mood and affect, behavior are normal  ED Course  Procedures (including critical care time)  Labs Review Labs Reviewed  GLUCOSE, CAPILLARY - Abnormal; Notable for the following:    Glucose-Capillary 261 (*)    All other components within normal limits  POCT URINALYSIS DIP (DEVICE) - Abnormal; Notable for the following:    Glucose, UA 500 (*)    Ketones, ur 15 (*)    Protein, ur 30 (*)    All other components within normal limits  POCT PREGNANCY, URINE   Reviewed and results impacted MDM  Imaging Review No results found.   Visual Acuity Review  Right Eye Distance:   Left Eye Distance:   Bilateral Distance:    Right Eye Near:   Left Eye Near:    Bilateral Near:      Back pain is most likely musculoskeletal. No suggestion of pyelonephritis or renal stone. There is elevation of glucose in the urine and increased drinking of fluids with polyuria.  Pt is advised to follow up with PCP for glucose, and back pain should get better.  Hope for a complete recovery.   rx for anaprox Return to work note provided.    MDM   1. Right-sided low back pain without sciatica   2. Elevated glucose   3. Glycosuria     Patient is reassured that there are no issues that require transfer to higher level of care at this time or additional tests. Patient is advised to continue home symptomatic treatment. Patient is advised that if there are new or worsening symptoms to attend the emergency department, contact primary care provider, or return to UC. Instructions of care provided discharged home in stable condition.    THIS NOTE WAS GENERATED USING A VOICE RECOGNITION SOFTWARE PROGRAM. ALL REASONABLE EFFORTS  WERE MADE TO PROOFREAD THIS DOCUMENT FOR ACCURACY.  I have verbally reviewed the discharge instructions with the patient. A printed AVS was given to the patient.  All questions were answered prior to discharge.      Tharon AquasFrank C Yurianna Tusing, PA 02/27/16  1520

## 2016-04-27 ENCOUNTER — Emergency Department (HOSPITAL_BASED_OUTPATIENT_CLINIC_OR_DEPARTMENT_OTHER)
Admission: EM | Admit: 2016-04-27 | Discharge: 2016-04-27 | Disposition: A | Discharge status: Home / Self Care | Payer: Medicaid Other | Attending: Emergency Medicine | Admitting: Emergency Medicine

## 2016-04-27 ENCOUNTER — Encounter (HOSPITAL_BASED_OUTPATIENT_CLINIC_OR_DEPARTMENT_OTHER): Payer: Self-pay | Admitting: *Deleted

## 2016-04-27 DIAGNOSIS — R739 Hyperglycemia, unspecified: Secondary | ICD-10-CM | POA: Insufficient documentation

## 2016-04-27 DIAGNOSIS — R200 Anesthesia of skin: Secondary | ICD-10-CM | POA: Diagnosis present

## 2016-04-27 DIAGNOSIS — Z79899 Other long term (current) drug therapy: Secondary | ICD-10-CM | POA: Diagnosis not present

## 2016-04-27 DIAGNOSIS — Z87891 Personal history of nicotine dependence: Secondary | ICD-10-CM | POA: Insufficient documentation

## 2016-04-27 LAB — CBG MONITORING, ED: Glucose-Capillary: 101 mg/dL — ABNORMAL HIGH (ref 65–99)

## 2016-04-27 NOTE — ED Triage Notes (Signed)
Pt reports around 1530 she started feeling facial numbness. Reports calling EMS and states she refused transport to hospital. Reports facial numbness is still present (face is symmetrical) and numbness in bilateral hands and feet (grips strong and pt able to ambulate). Denies fever, swelling, weakness, v/d. Reports mild nausea.

## 2016-04-27 NOTE — ED Provider Notes (Signed)
[facility] Note   CSN: [id number] Arrival date & time: 04/27/16  1558  By signing my name below, I, Sandra [name], attest that this documentation has been prepared under the direction and in the presence of Audry Pili, PA-C. Electronically Signed: Alyssa [name], ED Scribe. 04/27/16. 4:41 PM.   First MD Initiated Contact with Patient 04/27/16 1633      History   Chief Complaint Chief Complaint  Patient presents with  . Numbness   The history is provided by the patient. No language interpreter was used.    HPI Comments: Sandra [name] is a 25 y.o. female with PMHx of Panic Attacks who presents to the Emergency Department complaining of gradual onset constant perioral numbness onset 1 hour PTA as well as bilateral upper and lower distal extremity numbness. Pt states she was at rest when she first noticed symptoms. Pt states numbness is similar to numbness after sitting on hand. Pt denies any pain. Pt denies fever, swelling, weakness, vomiting, diarrhea, increased urinary frequency, no headache. No head trauma. Pt has an appointment with PCP tomorrow. Pt was seen in Urgent Care on 6/17 for high blood sugar and was informed to follow up with PCP.   Past Medical History:  Diagnosis Date  . Anemia   . Hemorrhage after delivery of fetus    Intraoperative, received blood transfusion  . History of blood transfusion   . Hx of ectopic pregnancy     Patient Active Problem List   Diagnosis Date Noted  . Obesity 01/13/2014  . Halitosis 01/13/2014  . Implanon in place 01/13/2014  . Panic attacks 01/13/2014  . Health care maintenance 01/13/2014    Past Surgical History:  Procedure Laterality Date  . CESAREAN SECTION    . CESAREAN SECTION  04/23/2012   Procedure: CESAREAN SECTION;  Surgeon: Reva Bores, MD;  Location: [facility];  Service: Gynecology;  Laterality: N/A;  . CHOLECYSTECTOMY  10/23/2012   Procedure: LAPAROSCOPIC CHOLECYSTECTOMY WITH INTRAOPERATIVE  CHOLANGIOGRAM;  Surgeon: Valarie Merino, MD;  Location: [facility];  Service: General;  Laterality: N/A;  . LAPAROSCOPY FOR ECTOPIC PREGNANCY      OB History    Gravida Para Term Preterm AB Living   3 2 2   1 2    SAB TAB Ectopic Multiple Live Births       1   2       Home Medications    Prior to Admission medications   Medication Sig Start Date End Date Taking? Authorizing Provider  antiseptic oral rinse (BIOTENE) LIQD 15 mLs by Mouth Rinse route as needed for dry mouth.   Yes Historical Provider, MD  cycloSPORINE (RESTASIS) 0.05 % ophthalmic emulsion Place 1 drop into both eyes 2 (two) times daily.   Yes Historical Provider, MD  Multiple Vitamin (MULTIVITAMIN WITH MINERALS) TABS Take 1 tablet by mouth daily.   Yes Historical Provider, MD  Olopatadine HCl (PAZEO) 0.7 % SOLN Apply to eye daily.   Yes Historical Provider, MD  azithromycin (ZITHROMAX) 250 MG tablet Take 1 tablet (250 mg total) by mouth daily. Take first 2 tablets together, then 1 every day until finished. 08/10/13   Glynn Octave, MD  Dextromethorphan HBr (VICKS DAYQUIL COUGH) 15 MG/15ML LIQD Take 30 mLs by mouth every 6 (six) hours as needed (cough).    Historical Provider, MD  DM-Doxylamine-Acetaminophen (NYQUIL COLD & FLU PO) Take 30 mLs by mouth at bedtime as needed (cough).    Historical Provider, MD  guaiFENesin (ROBITUSSIN) 100  MG/5ML liquid Take 5-10 mLs (100-200 mg total) by mouth every 4 (four) hours as needed for cough. 08/10/13   Glynn OctaveStephen Rancour, MD  naproxen sodium (ANAPROX DS) 550 MG tablet Take 1 tablet (550 mg total) by mouth 2 (two) times daily with a meal. 02/27/16   Tharon AquasFrank C Patrick, PA  ondansetron (ZOFRAN) 4 MG tablet Take 1 tablet (4 mg total) by mouth every 6 (six) hours. 08/10/13   Glynn OctaveStephen Rancour, MD    Family History Family History  Problem Relation Age of Onset  . High blood pressure Mother   . Aneurysm Mother   . Hypertension Mother   . Drug abuse Mother   . Diabetes Mellitus II Father   .  Hyperlipidemia Father   . Alcohol abuse Father   . Heart disease Maternal Aunt   . Diabetes Paternal Grandmother   . Diabetes Paternal Grandfather   . Other Neg Hx     Social History Social History  Substance Use Topics  . Smoking status: Former Games developermoker  . Smokeless tobacco: Never Used  . Alcohol use Yes     Comment: occasionally     Allergies   Shellfish allergy   Review of Systems Review of Systems  A complete 10 system review of systems was obtained and all systems are negative except as noted in the HPI and PMH.   Physical Exam Updated Vital Signs BP 123/67 (BP Location: Left Arm)   Pulse 83   Temp 98.5 F (36.9 C) (Oral)   Resp 16   Ht 4\' 11"  (1.499 m)   Wt 238 lb (108 kg)   LMP 04/10/2016 (Approximate)   SpO2 100%   BMI 48.07 kg/m   Physical Exam  Constitutional: She is oriented to person, place, and time. Vital signs are normal. She appears well-developed and well-nourished.  HENT:  Head: Normocephalic.  Right Ear: Hearing normal.  Left Ear: Hearing normal.  Eyes: Conjunctivae and EOM are normal. Pupils are equal, round, and reactive to light.  Cardiovascular: Normal rate, regular rhythm and intact distal pulses.   Pulmonary/Chest: Effort normal. No respiratory distress.  Abdominal: She exhibits no distension.  Musculoskeletal: Normal range of motion.  Neurological: She is alert and oriented to person, place, and time. She has normal strength. No cranial nerve deficit or sensory deficit. She displays a negative Romberg sign.  Sensations equal bilaterally Neurovascular intact in bilateral upper and lower extremities Cranial Nerves:  II: Pupils equal, round, reactive to light III,IV, VI: ptosis not present, extra-ocular motions intact bilaterally  V,VII: smile symmetric, facial light touch sensation equal VIII: hearing grossly normal bilaterally  IX,X: midline uvula rise  XI: bilateral shoulder shrug equal and strong XII: midline tongue extension    Skin: Skin is warm and dry.  Psychiatric: She has a normal mood and affect. Her speech is normal and behavior is normal. Thought content normal.  Nursing note and vitals reviewed.  ED Treatments / Results  DIAGNOSTIC STUDIES: Oxygen Saturation is 100% on RA, normal by my interpretation.    COORDINATION OF CARE: 4:37 PM Discussed treatment plan with pt at bedside which includes blood sugar test and pt agreed to plan.  Labs (all labs ordered are listed, but only abnormal results are displayed) Labs Reviewed  CBG MONITORING, ED - Abnormal; Notable for the following:       Result Value   Glucose-Capillary 101 (*)    All other components within normal limits   EKG  EKG Interpretation None  Radiology No results found.  Procedures Procedures (including critical care time)  Medications Ordered in ED Medications - No data to display   Initial Impression / Assessment and Plan / ED Course  I have reviewed the triage vital signs and the nursing notes.  Pertinent labs & imaging results that were available during my care of the patient were reviewed by me and considered in my medical decision making (see chart for details).  Clinical Course    Final Clinical Impressions(s) / ED Diagnoses  I have reviewed and evaluated the relevant laboratory valuesI have reviewed the relevant previous healthcare records. I obtained HPI from historian.  ED Course:  Assessment: Pt is a 25yF who presents with perioral facial numbness as well as BUE and BLE distal extremity numbness with onset today. On exam, pt in NAD. Nontoxic/nonseptic appearing. VSS. Afebrile. CN evaluated and intact. Pt without headache. No facial droops. BUE/BLE exam without acute abnormalities. Normal motor/sensation. CBG 101. Likely DM related Plan is to DC home with follow up to PCP at tomorrow appointment. Counseled on diet control. At time of discharge, Patient is in no acute distress. Vital Signs are stable. Patient is  able to ambulate. Patient able to tolerate PO.    Disposition/Plan:  DC home Additional Verbal discharge instructions given and discussed with patient.  Pt Instructed to f/u with PCP in the next week for evaluation and treatment of symptoms. Return precautions given Pt acknowledges and agrees with plan  Supervising Physician Nelva Nay, MD   Final diagnoses:  Hyperglycemia    New Prescriptions New Prescriptions   No medications on file  I personally performed the services described in this documentation, which was scribed in my presence. The recorded information has been reviewed and is accurate.     Audry Pili, PA-C 04/27/16 1651    Nelva Nay, MD 04/28/16 [phone]

## 2016-04-27 NOTE — Discharge Instructions (Signed)
Please read and follow all provided instructions.  Your diagnoses today include:  1. Hyperglycemia    Tests performed today include: Vital signs. See below for your results today.   Medications prescribed:  Take as prescribed   Home care instructions:  Follow any educational materials contained in this packet.  Follow-up instructions: Please follow-up with your primary care provider tomorrow for further evaluation of symptoms and treatment   Return instructions:  Please return to the Emergency Department if you do not get better, if you get worse, or new symptoms OR  - Fever (temperature greater than 101.89F)  - Bleeding that does not stop with holding pressure to the area    -Severe pain (please note that you may be more sore the day after your accident)  - Chest Pain  - Difficulty breathing  - Severe nausea or vomiting  - Inability to tolerate food and liquids  - Passing out  - Skin becoming red around your wounds  - Change in mental status (confusion or lethargy)  - New numbness or weakness    Please return if you have any other emergent concerns.  Additional Information:  Your vital signs today were: BP 123/67 (BP Location: Left Arm)    Pulse 83    Temp 98.5 F (36.9 C) (Oral)    Resp 16    Ht 4\' 11"  (1.499 m)    Wt 108 kg    LMP 04/10/2016 (Approximate)    SpO2 100%    BMI 48.07 kg/m  If your blood pressure (BP) was elevated above 135/85 this visit, please have this repeated by your doctor within one month. --------------

## 2016-04-28 ENCOUNTER — Other Ambulatory Visit (HOSPITAL_COMMUNITY)
Admission: RE | Admit: 2016-04-28 | Discharge: 2016-04-28 | Disposition: A | Discharge status: Home / Self Care | Payer: Medicaid Other | Source: Ambulatory Visit | Attending: Family Medicine | Admitting: Family Medicine

## 2016-04-28 ENCOUNTER — Encounter: Payer: Self-pay | Admitting: Family Medicine

## 2016-04-28 ENCOUNTER — Ambulatory Visit (INDEPENDENT_AMBULATORY_CARE_PROVIDER_SITE_OTHER): Payer: Medicaid Other | Admitting: Family Medicine

## 2016-04-28 VITALS — BP 154/69 | HR 88 | Temp 98.4°F | Ht 59.0 in | Wt 232.4 lb

## 2016-04-28 DIAGNOSIS — F411 Generalized anxiety disorder: Secondary | ICD-10-CM

## 2016-04-28 DIAGNOSIS — B3731 Acute candidiasis of vulva and vagina: Secondary | ICD-10-CM

## 2016-04-28 DIAGNOSIS — K219 Gastro-esophageal reflux disease without esophagitis: Secondary | ICD-10-CM | POA: Diagnosis not present

## 2016-04-28 DIAGNOSIS — Z1151 Encounter for screening for human papillomavirus (HPV): Secondary | ICD-10-CM | POA: Diagnosis present

## 2016-04-28 DIAGNOSIS — Z01419 Encounter for gynecological examination (general) (routine) without abnormal findings: Secondary | ICD-10-CM | POA: Insufficient documentation

## 2016-04-28 DIAGNOSIS — R196 Halitosis: Secondary | ICD-10-CM | POA: Diagnosis not present

## 2016-04-28 DIAGNOSIS — B373 Candidiasis of vulva and vagina: Secondary | ICD-10-CM

## 2016-04-28 DIAGNOSIS — Z0000 Encounter for general adult medical examination without abnormal findings: Secondary | ICD-10-CM | POA: Diagnosis not present

## 2016-04-28 DIAGNOSIS — R739 Hyperglycemia, unspecified: Secondary | ICD-10-CM | POA: Diagnosis not present

## 2016-04-28 DIAGNOSIS — Z124 Encounter for screening for malignant neoplasm of cervix: Secondary | ICD-10-CM

## 2016-04-28 LAB — CBC
HCT: 40.9 % (ref 35.0–45.0)
HEMOGLOBIN: 12.7 g/dL (ref 11.7–15.5)
MCH: 24.6 pg — ABNORMAL LOW (ref 27.0–33.0)
MCHC: 31.1 g/dL — ABNORMAL LOW (ref 32.0–36.0)
MCV: 79.3 fL — AB (ref 80.0–100.0)
MPV: 10.5 fL (ref 7.5–12.5)
PLATELETS: 215 10*3/uL (ref 140–400)
RBC: 5.16 MIL/uL — AB (ref 3.80–5.10)
RDW: 15 % (ref 11.0–15.0)
WBC: 6.4 10*3/uL (ref 3.8–10.8)

## 2016-04-28 LAB — BASIC METABOLIC PANEL
BUN: 11 mg/dL (ref 7–25)
CHLORIDE: 105 mmol/L (ref 98–110)
CO2: 20 mmol/L (ref 20–31)
Calcium: 9.3 mg/dL (ref 8.6–10.2)
Creat: 0.78 mg/dL (ref 0.50–1.10)
Glucose, Bld: 130 mg/dL — ABNORMAL HIGH (ref 65–99)
Potassium: 4.2 mmol/L (ref 3.5–5.3)
SODIUM: 138 mmol/L (ref 135–146)

## 2016-04-28 LAB — TSH: TSH: 0.88 mIU/L

## 2016-04-28 MED ORDER — FLUCONAZOLE 150 MG PO TABS: 150.0000 mg | ORAL_TABLET | Freq: Once | ORAL | 0 refills | Status: AC

## 2016-04-28 MED ORDER — OMEPRAZOLE 40 MG PO CPDR: 40.0000 mg | DELAYED_RELEASE_CAPSULE | Freq: Every day | ORAL | 1 refills | Status: DC

## 2016-04-28 NOTE — Assessment & Plan Note (Signed)
With history of panic attacks in the past. She would like to defer any medication use today. I did have favorable health consultant Dr. Pascal LuxKane talk with the patient today for methods of trying to reduce anxiety especially at night.

## 2016-04-28 NOTE — Progress Notes (Signed)
Subjective:    Sandra [name] is a 25 y.o. female who presents to [facility] today for several concerns, including want to have Pap smear done:  1.  Bad breath:Patient has had ongoing issues with halitosis over the past couple years. She states she is been seen and cleared by her dentist. She states her dentist told her that I breath does not cover her mouth. She is concerned because this is affecting interactions with other people. She has heard people at work complaining about "that girl with bad breath." She is convinced they're talking about her. She's had no fevers or chills. She has had tonsillectomy in the past which has not affected her perception of her halitosis. Does endorse some reflux after most meals.  2.  Anxiety: Ongoing issues of this. Worsened recently secondary to loss of her job. Also tablet 2-3 weeks ago. She states she has racing thoughts at night which keep her awake. She like to talk some about this. She denies any current depressive symptoms. She also does not want to try any medication treatment currently.  #3. Hyperglycemia: Patient was seen in the emergency department yesterday. Had some numbness and tingling. She is concerned that was going on. She had not eaten yesterday and had fasting blood sugars which are somewhat elevated. She has a strong family history of diabetes. She is concerned that she is developing diabetes. She is not very active. She has a poor diet.  Physical exam:  Patient also desires Pap smear today.   ROS as above per HPI.    The following portions of the patient's history were reviewed and updated as appropriate: allergies, current medications, past medical history, family and social history, and problem list. Patient is a nonsmoker.    PMH reviewed.  Past Medical History:  Diagnosis Date  . Anemia   . Hemorrhage after delivery of fetus    Intraoperative, received blood transfusion  . History of blood transfusion   . Hx of ectopic pregnancy     Past Surgical History:  Procedure Laterality Date  . CESAREAN SECTION    . CESAREAN SECTION  04/23/2012   Procedure: CESAREAN SECTION;  Surgeon: Reva Bores, MD;  Location: [facility];  Service: Gynecology;  Laterality: N/A;  . CHOLECYSTECTOMY  10/23/2012   Procedure: LAPAROSCOPIC CHOLECYSTECTOMY WITH INTRAOPERATIVE CHOLANGIOGRAM;  Surgeon: Valarie Merino, MD;  Location: [facility];  Service: General;  Laterality: N/A;  . LAPAROSCOPY FOR ECTOPIC PREGNANCY      Medications reviewed. Current Outpatient Prescriptions  Medication Sig Dispense Refill  . antiseptic oral rinse (BIOTENE) LIQD 15 mLs by Mouth Rinse route as needed for dry mouth.    Marland Kitchen azithromycin (ZITHROMAX) 250 MG tablet Take 1 tablet (250 mg total) by mouth daily. Take first 2 tablets together, then 1 every day until finished. 6 tablet 0  . cycloSPORINE (RESTASIS) 0.05 % ophthalmic emulsion Place 1 drop into both eyes 2 (two) times daily.    Marland Kitchen Dextromethorphan HBr (VICKS DAYQUIL COUGH) 15 MG/15ML LIQD Take 30 mLs by mouth every 6 (six) hours as needed (cough).    . DM-Doxylamine-Acetaminophen (NYQUIL COLD & FLU PO) Take 30 mLs by mouth at bedtime as needed (cough).    Marland Kitchen guaiFENesin (ROBITUSSIN) 100 MG/5ML liquid Take 5-10 mLs (100-200 mg total) by mouth every 4 (four) hours as needed for cough. 60 mL 0  . Multiple Vitamin (MULTIVITAMIN WITH MINERALS) TABS Take 1 tablet by mouth daily.    . naproxen sodium (ANAPROX DS) 550 MG tablet Take  1 tablet (550 mg total) by mouth 2 (two) times daily with a meal. 30 tablet 0  . Olopatadine HCl (PAZEO) 0.7 % SOLN Apply to eye daily.    . ondansetron (ZOFRAN) 4 MG tablet Take 1 tablet (4 mg total) by mouth every 6 (six) hours. 12 tablet 0   No current facility-administered medications for this visit.      Objective:   Physical Exam BP (!) 154/69   Pulse 88   Temp 98.4 F (36.9 C) (Oral)   Ht 4\' 11"  (1.499 m)   Wt 232 lb 6.4 oz (105.4 kg)   LMP 04/10/2016 (Approximate)   BMI 46.94 kg/m   Gen:  Alert, cooperative patient who appears stated age in no acute distress.  Vital signs reviewed.Morbidly obese.  HEENT: EOMI,  MMM. I do not note any foul odor/bad breath. she has a high Mallampati score and I'm unable to visualize her posterior oropharynx.  Cardiac:  Regular rate and rhythm without murmur auscultated.  Good S1/S2. Pulm:  Clear to auscultation bilaterally with good air movement.  No wheezes or rales noted.   Abd:  Soft/nondistended/nontender.  Good bowel sounds throughout all four quadrants.  No masses noted.  Exts: Non edematous BL  LE, warm and well perfused.  GYN:  External genitalia within normal limits.  Vaginal mucosa pink, moist, normal rugae.  Nonfriable cervix without lesions, with thick white discharge typical of yeast noted on speculum exam.  Pap smear completed today.     Results for orders placed or performed during the hospital encounter of 04/27/16 (from the past 72 hour(s))  POC CBG, ED     Status: Abnormal   Collection Time: 04/27/16  4:42 PM  Result Value Ref Range   Glucose-Capillary 101 (H) 65 - 99 mg/dL

## 2016-04-28 NOTE — Assessment & Plan Note (Signed)
Pap smear today. Also discussed increase activity in light of morbid obesity. Also with hyperglycemia.

## 2016-04-28 NOTE — Assessment & Plan Note (Addendum)
Treated with omeprazole. Hopeful this will also improve halitosis

## 2016-04-28 NOTE — Assessment & Plan Note (Signed)
Unclear etiology. She had tonsillectomy which did not seem to help this issue. Possibility that's worsened by her anxiety, or at least perception thereof. Had difficulty being able to nail down whether other people actually experienced this or if she simply believes this. Does have evidence of GERD. Treated for that today. This may help with some of her symptoms.

## 2016-04-28 NOTE — Patient Instructions (Addendum)
It was good to see you today.  You do have a yeast infection.  This is causing part of your rash.  The other is from friction.  Gold Bond or other soothing powders can help with this.  Your blood sugar levels improving will also help.  Take the diflucan for your yeast infection.    Try the Omeprazole for the next 30 days.  Come back and see us in about 1 month to see how your doing with reflux and your breath at that point.    ______________________________  From Dr. Pascal LuxKane.  It was great to meet you today.  I have scheduled you an appointment with a Behavioral Health Consultant, Loletta SpecterVaibhav, for August 18th at 1:30.  Please call [phone] if you are unable to make this appointment.

## 2016-04-28 NOTE — Assessment & Plan Note (Signed)
Noted on exam. After discussing with her she has had some itching and vaginal region. Treat with Diflucan

## 2016-04-28 NOTE — Assessment & Plan Note (Signed)
Checking A1c today.

## 2016-04-28 NOTE — Progress Notes (Signed)
Dr. Gwendolyn GrantWalden requested a Behavioral Health Consult.   Presenting Issue:  Baseline anxiety with a recent up tick since losing her job at [employer].    Report of symptoms:  History of panic attacks starting at age 25.  Currently anxious about a body odor.  Used to think it was bad breath but she went to the dentist, got all of her dental needs addressed, and was told that the odor was not coming from her breath.  Gets "triggered" when people wipe their nose -thinking that they smell her bad odor.  Dr. Gwendolyn GrantWalden also reported she was having racing thoughts and difficulty sleeping.  Duration of CURRENT symptoms:  Very recently lost her job so anxiety has increased since then.  Age of onset of first mood disturbance:  She reports when she was 25 years old.  Had a panic attack on a [place] bridge.  Impact on function:  Affects self esteem.  May have gotten in the way of job performance??  Psychiatric History - Diagnoses: Anxiety - Hospitalizations: Denies - Pharmacotherapy: Denies - Outpatient therapy: Therapy in January (only reported time) but thought she was being judged.  Was not a good match for her.  Family history of psychiatric issues:  Reports mom (deceased) had unspecified anxiety and sadness.  Older brother has "learning disabilities."  Denied issues in father and sister.  Denied a family history of bipolar disorder.  Current and history of substance use:  Did not assess.  Medical conditions that might explain or contribute to symptoms:  Problem list reviewed.    PHQ-9:  PHQ-2 was 0. GAD-7:  Not obtained - may be good to get.  Assessment / Plan / Recommendations: Main reason for consult is her expressed interest in "talking to someone."  She agrees that she might perceive negative evaluation from others in the absence of it.  Suggested that if she feels judged in upcoming therapy encounters, that the therapy space is a great place to bring that out to be worked through (rather than leaving  therapy).  [facility] Lo[facility] SpecterVaibhav, for Friday 8/18 at 1:30.

## 2016-05-02 LAB — CYTOLOGY - PAP

## 2016-05-03 ENCOUNTER — Telehealth: Payer: Self-pay | Admitting: Family Medicine

## 2016-05-03 DIAGNOSIS — R739 Hyperglycemia, unspecified: Secondary | ICD-10-CM

## 2016-05-03 MED ORDER — METRONIDAZOLE 500 MG PO TABS: 500.0000 mg | ORAL_TABLET | Freq: Two times a day (BID) | ORAL | 0 refills | Status: AC

## 2016-05-03 NOTE — Telephone Encounter (Signed)
Called and discussed labs with patient.  She has evidence of diabetes with CBG >126 fasting.  Recommended to come in next appt where we can check A1C to make the diagnosis.    In the meantime, she would like to be referred to a nutritionist.  I will do that today.  Also, the pap smear showed changes consistent with BV.  This could be contributing to her vaginal odor.  I will send in Flagyl today.  Warnings about EtOH consumption provided.    She was appreciative of call.  Will fu at next appt already scheduled.

## 2016-05-05 ENCOUNTER — Ambulatory Visit (INDEPENDENT_AMBULATORY_CARE_PROVIDER_SITE_OTHER): Payer: Medicaid Other | Admitting: Psychology

## 2016-05-05 DIAGNOSIS — F411 Generalized anxiety disorder: Secondary | ICD-10-CM

## 2016-05-05 NOTE — Assessment & Plan Note (Signed)
Assessment/Plan/Recommendations:  Patient was tearful and struggled to get words out initially and teared up frequently throughout the session. She is under a lot of stress and fear related to difficulties figuring out source of body odor, recent loss of job and financial strain, and diagnosis of diabetes given 3 days ago. GAD-7 score of 13 (symptoms include: nervousness, worry about many things, trouble relaxing, irritability, and fear). She denied suicidal ideation. [facility] expressed empathy and discussed coping strategies. Patient was receptive and found deep breathing to be relaxing. She will practice deep breathing, spending time with loved ones, and sleep hygiene techniques. [facility]

## 2016-05-05 NOTE — Progress Notes (Signed)
Reason for follow-up:  To discuss coping strategies to manage anxiety  Issues discussed:  Discussed 1) difficulties with self-esteem related to body odor concerns, loss of job, and new Diabetes diagnosis 2) Difficulty with anxiety and stress 3) Sleep disturbances due to anxious thoughts   Identified goals:  1) Practice deep breathing throughout the day and before sleeping to aid in relaxation. 2) Spend more time with loved ones engaging in beneficial activities (e.g., healthy meal planning, and playing with children). 3) Reduce activity and planning at night to allow time to relax before sleeping. If still worrying at night, leave the room and read your book until tired. 4) [facility].

## 2016-05-05 NOTE — Patient Instructions (Signed)
It was great to meet you today Sandra [name].  Today we discussed 1. Relaxation technique of deep breathing  -provided sheet with instructions 2. Sleep hygiene technique  -reading at night and returning to bed when tired  -practicing relaxation techniques at night  -reduce activity at night 3. Increase time spent with loved ones (children, and daughter's father)  We will meet again next Friday the 25th at 1:30pm

## 2016-05-12 ENCOUNTER — Ambulatory Visit: Payer: Medicaid Other

## 2016-05-15 ENCOUNTER — Ambulatory Visit (INDEPENDENT_AMBULATORY_CARE_PROVIDER_SITE_OTHER): Payer: Medicaid Other | Admitting: Psychology

## 2016-05-15 DIAGNOSIS — F411 Generalized anxiety disorder: Secondary | ICD-10-CM

## 2016-05-15 NOTE — Patient Instructions (Addendum)
Victorino DikeJennifer it was a pleasure to see you today  Today we discussed  1) Practicing assertiveness in responding to hurtful comments Ex. So if someone says "I have a friend that fixes washing machines"... You came up with saying "I heard the comment you made, I am aware of the issue. I am working on it. Next time please talk to me about it privately if you have any concerns"  2) Practicing either deep breathing or coping thoughts to relax before confronting others  3) Continue what you're doing with meditation and living your life according to your goals, your values.  See you on the Sept. 11th at 8:30AM.

## 2016-05-15 NOTE — Assessment & Plan Note (Signed)
Assessment/Plan/Recommendations: Patient was upbeat and positive initially in discussing sleep improvement and the "Headspace" meditation app she has been using. She expressed pride in herself for walking around the apartment complex and going to the gym with a friend despite rude comments and actions from neighbors. [facility] discussed strategies for responding to rude comments about patient's body odor from others and using coping thoughts and deep breathing to help her respond appropriately to hurtful comments. [facility].

## 2016-05-15 NOTE — Progress Notes (Signed)
Reason for follow-up: [facility] and [facility] planned to coping strategies around patient symptoms of anxiety and stress  Issues discussed:  Patient found meditation and relaxation app (Headspace) immensely helpful and explained how it works [facility]. She uses this during the day and at night to improve sleep. Patient feels angry and sad when others make rude comments about her odor, and faces a separate group of people who call her "crazy" for worrying so much about her odor. We discussed strategies to cope with both groups of people.  Identified goals:  1) Patient will practice polite assertiveness when she overhears or is directly told hurtful comments about her odor (see AVS note for example). 2) Patient will use deep breathing and coping thoughts to manage her anger prior to engaging in assertiveness.

## 2016-05-26 ENCOUNTER — Ambulatory Visit (INDEPENDENT_AMBULATORY_CARE_PROVIDER_SITE_OTHER): Payer: Medicaid Other | Admitting: Family Medicine

## 2016-05-26 ENCOUNTER — Encounter: Payer: Self-pay | Admitting: Family Medicine

## 2016-05-26 VITALS — BP 128/80 | HR 88 | Temp 98.3°F | Ht 59.0 in | Wt 217.0 lb

## 2016-05-26 DIAGNOSIS — E119 Type 2 diabetes mellitus without complications: Secondary | ICD-10-CM | POA: Insufficient documentation

## 2016-05-26 DIAGNOSIS — E139 Other specified diabetes mellitus without complications: Secondary | ICD-10-CM | POA: Diagnosis present

## 2016-05-26 DIAGNOSIS — K219 Gastro-esophageal reflux disease without esophagitis: Secondary | ICD-10-CM | POA: Diagnosis not present

## 2016-05-26 DIAGNOSIS — R739 Hyperglycemia, unspecified: Secondary | ICD-10-CM | POA: Diagnosis not present

## 2016-05-26 LAB — POCT GLYCOSYLATED HEMOGLOBIN (HGB A1C): HEMOGLOBIN A1C: 8

## 2016-05-26 MED ORDER — METFORMIN HCL 500 MG PO TABS: 500.0000 mg | ORAL_TABLET | Freq: Two times a day (BID) | ORAL | 3 refills | Status: DC

## 2016-05-26 MED ORDER — OMEPRAZOLE 40 MG PO CPDR: 40.0000 mg | DELAYED_RELEASE_CAPSULE | Freq: Every day | ORAL | 3 refills | Status: DC

## 2016-05-26 NOTE — Progress Notes (Signed)
Subjective:  Sandra [name] is a 25 y.o. female who presents to the [facility] today for a follow up on her GERD, reported halitosis and A1C check  HPI: Sandra [name] reports that her symptoms of reflex have improved on omeprazole, less chest pain burning and sensation.  She does still report bad breath, however Dr. Gwendolyn Grant denied noticing this during his last exam.  She is increasingly concerned about the possibility of heaving diabetes.  She denies increased thirst or polyphagia and does note some increased urination, but attributes that to drinking a lot of water.  Her most recently CBGs were all elevated in the low 100s, however she was not fasting for the exam.    PMH:  GERD and recently started taking omeprazoel, anxiety-- seen by Dr. Pascal Lux in [facility] last week SH: former smoker Allergies: Shellfish  Objective:  Physical Exam: BP 128/80   Pulse 88   Temp 98.3 F (36.8 C) (Oral)   Ht 4\' 11"  (1.499 m)   Wt 217 lb (98.4 kg)   LMP 05/01/2016   SpO2 99%   BMI 43.83 kg/m   Gen: 25yo F in NAD, resting comfortably CV: RRR with no murmurs appreciated Pulm: NWOB, CTAB with no crackles, wheezes, or rhonchi GI: Normal bowel sounds present. Soft, Nontender, Nondistended. MSK: no edema, cyanosis, or clubbing noted Skin: warm, dry Neuro: grossly normal, moves all extremities Psych: Normal affect and thought content  Results for orders placed or performed in visit on 05/26/16 (from the past 72 hour(s))  POCT glycosylated hemoglobin (Hb A1C)     Status: None   Collection Time: 05/26/16  1:33 PM  Result Value Ref Range   Hemoglobin A1C 8.0     Current Outpatient Prescriptions on File Prior to Visit  Medication Sig Dispense Refill  . antiseptic oral rinse (BIOTENE) LIQD 15 mLs by Mouth Rinse route as needed for dry mouth.    Marland Kitchen azithromycin (ZITHROMAX) 250 MG tablet Take 1 tablet (250 mg total) by mouth daily. Take first 2 tablets together, then 1 every day until finished. 6 tablet 0  . cycloSPORINE  (RESTASIS) 0.05 % ophthalmic emulsion Place 1 drop into both eyes 2 (two) times daily.    Marland Kitchen Dextromethorphan HBr (VICKS DAYQUIL COUGH) 15 MG/15ML LIQD Take 30 mLs by mouth every 6 (six) hours as needed (cough).    . DM-Doxylamine-Acetaminophen (NYQUIL COLD & FLU PO) Take 30 mLs by mouth at bedtime as needed (cough).    Marland Kitchen guaiFENesin (ROBITUSSIN) 100 MG/5ML liquid Take 5-10 mLs (100-200 mg total) by mouth every 4 (four) hours as needed for cough. 60 mL 0  . Multiple Vitamin (MULTIVITAMIN WITH MINERALS) TABS Take 1 tablet by mouth daily.    . naproxen sodium (ANAPROX DS) 550 MG tablet Take 1 tablet (550 mg total) by mouth 2 (two) times daily with a meal. 30 tablet 0  . Olopatadine HCl (PAZEO) 0.7 % SOLN Apply to eye daily.    Marland Kitchen omeprazole (PRILOSEC) 40 MG capsule Take 1 capsule (40 mg total) by mouth daily. 30 capsule 1  . ondansetron (ZOFRAN) 4 MG tablet Take 1 tablet (4 mg total) by mouth every 6 (six) hours. 12 tablet 0   No current facility-administered medications on file prior to visit.    Assessment/Plan:  GERD (gastroesophageal reflux disease) Patient prescribed 40mg  omeprazole nearly one month ago and feels improvement.  Less chest pain and burning sensation.   Plan: -Continue 40mg  omeprazole  Diabetes mellitus without complication, without long-term current use of insulin (  HCC) Patient here for A1C check after some mildly elevated CBGs.  A1C today was 8.0.  Patient is obese and very likely has type 2 diabetes.  However, given patient's age I wanted to check c-peptide to rule out possibility of type 1 diabetes.  - started on metformin 500mg  BID - f/u c-peptide

## 2016-05-26 NOTE — Assessment & Plan Note (Signed)
Patient here for A1C check after some mildly elevated CBGs.  A1C today was 8.0.  Patient is obese and very likely has type 2 diabetes.  However, given patient's age I wanted to check c-peptide to rule out possibility of type 1 diabetes.  - started on metformin 500mg  BID - f/u c-peptide

## 2016-05-26 NOTE — Patient Instructions (Addendum)
Today you came in to have your A1C checked to evaluate for diabetes.  Your A1C was 8, which is above the threshold for diabetes.  However, given your age I want to check a lab to see if you have type 1 or type 2 diabetes.  I will call you with these results.  In the meantime keep up with diet and exercise.  I suspect that you will learn a lot at your nutrition appointment on Monday.   I have also prescribed you a medication called metformin that you will take two times per day with food.   I am glad that your reflux symptoms have improved.  It was a pleasure meeting you today.  Meryl Hubers L. Myrtie SomanWarden, MD [facility] Resident PGY-1 05/26/2016 2:25 PM

## 2016-05-26 NOTE — Assessment & Plan Note (Addendum)
Patient prescribed 40mg  omeprazole nearly one month ago and feels improvement.  Less chest pain and burning sensation.   Plan: -Continue 40mg  omeprazole

## 2016-05-27 LAB — C-PEPTIDE: C PEPTIDE: 1.34 ng/mL (ref 0.80–3.85)

## 2016-05-29 ENCOUNTER — Ambulatory Visit (INDEPENDENT_AMBULATORY_CARE_PROVIDER_SITE_OTHER): Payer: Medicaid Other | Admitting: Psychology

## 2016-05-29 ENCOUNTER — Telehealth: Payer: Self-pay | Admitting: Family Medicine

## 2016-05-29 ENCOUNTER — Telehealth: Payer: Self-pay | Admitting: Psychology

## 2016-05-29 DIAGNOSIS — F411 Generalized anxiety disorder: Secondary | ICD-10-CM

## 2016-05-29 DIAGNOSIS — E139 Other specified diabetes mellitus without complications: Secondary | ICD-10-CM

## 2016-05-29 DIAGNOSIS — E119 Type 2 diabetes mellitus without complications: Secondary | ICD-10-CM

## 2016-05-29 DIAGNOSIS — E1110 Type 2 diabetes mellitus with ketoacidosis without coma: Secondary | ICD-10-CM
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
## 2016-05-29 NOTE — Telephone Encounter (Signed)
Spoke with Sandra DikeJennifer regarding results of c-peptide.  Also requested a prescription for a glucometer.  I told her that it was not necessary at this time given her A1C and we could discuss this further at her next appointment if she has questions.

## 2016-05-29 NOTE — Assessment & Plan Note (Signed)
Assessment/Plan/Recommendations: [facility]) Patient is not currently experiencing noticeable side effects of metformin. She is eating much healthier; she has stopped eating junk foods and snacks, and has increased healthy foods like baked chicken and fresh vegetables. She exercises several times a week (e.g., elliptical, walking, exercise DVD's). She feels motivated by the passing away of her father and health problems of other family members due to complications related to Type 2 diabetes. She plans to continue positive health behaviors and will follow-up if she needs assistance around healthy behaviors and nutrition with her doctor or a nutritionist.

## 2016-05-29 NOTE — Telephone Encounter (Signed)
[facility] and spoke with patient letting her know that Dr. Myrtie SomanWarden is okay with her drinking red wine occasionally.
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
## 2016-06-02 ENCOUNTER — Encounter: Payer: Medicaid Other | Attending: Family Medicine | Admitting: Dietician

## 2016-06-02 ENCOUNTER — Encounter: Payer: Self-pay | Admitting: Dietician

## 2016-06-02 DIAGNOSIS — R739 Hyperglycemia, unspecified: Secondary | ICD-10-CM | POA: Insufficient documentation

## 2016-06-02 DIAGNOSIS — E089 Diabetes mellitus due to underlying condition without complications: Secondary | ICD-10-CM

## 2016-06-02 NOTE — Progress Notes (Signed)
Diabetes Self-Management Education  Visit Type: First/Initial  Appt. Start Time: 900 Appt. End Time: 1000  06/02/2016  Sandra [name], identified by name and date of birth, is a 25 y.o. female with a diagnosis of Diabetes: Type 2. Other hx includes GERD.  A1C 8.0% 05/26/16 and CPeptide 1.34.  She reports being diagnosed in June but was unaware of any A1C check.  She has lost from 256 lbs to 116 lbs (40 lbs) in the past 3 months.  Her boyfriend has been losing as well.  She has been following a vegan diet and watching her carbohydrate intake closely as well as testing and identifying the effects of food on her blood sugar.  The meter that she has is not covered by Medicaid.  Provided patient with the Accu Check Guide Lot [id number], expiration 03-28-2017.    Patient is here with her boyfriend and 37 yo daughter.  She lives with her daughter's father.  She does the cooking and shopping and has become much more active since the diagnosis.  She is currently unemployed and is working on her GED.  She reports increased stress eating in the past.  She has tried to exercise when she is feeling stressed and goes to therapy. She had a birthcontrol implant removed at the beginning of the year.  She had gained 40-50 lbs on this.  ASSESSMENT  Height 4\' 11"  (1.499 m), weight 216 lb (98 kg), last menstrual period 05/01/2016. Body mass index is 43.63 kg/m.      Diabetes Self-Management Education - 06/02/16 0909      Visit Information   Visit Type First/Initial     Initial Visit   Diabetes Type Type 2   Are you currently following a meal plan? Yes   What type of meal plan do you follow? vegan   Are you taking your medications as prescribed? Yes   Date Diagnosed June 2016     Health Coping   How would you rate your overall health? Excellent     Psychosocial Assessment   Patient Belief/Attitude about Diabetes Motivated to manage diabetes   Self-care barriers None   Self-management support Doctor's  office;Friends   Other persons present Patient;Spouse/SO   Patient Concerns Nutrition/Meal planning;Glycemic Control;Weight Control   Special Needs None   Preferred Learning Style No preference indicated   Learning Readiness Ready   How often do you need to have someone help you when you read instructions, pamphlets, or other written materials from your doctor or pharmacy? 3 - Sometimes   What is the last grade level you completed in school? 12th grade     Pre-Education Assessment   Patient understands the diabetes disease and treatment process. Needs Instruction   Patient understands incorporating nutritional management into lifestyle. Needs Review   Patient undertands incorporating physical activity into lifestyle. Demonstrates understanding / competency   Patient understands using medications safely. Needs Review   Patient understands monitoring blood glucose, interpreting and using results Needs Instruction   Patient understands prevention, detection, and treatment of acute complications. Needs Instruction   Patient understands prevention, detection, and treatment of chronic complications. Needs Instruction   Patient understands how to develop strategies to address psychosocial issues. Needs Instruction   Patient understands how to develop strategies to promote health/change behavior. Needs Instruction     Complications   Last HgB A1C per patient/outside source 8 %  05/2016   How often do you check your blood sugar? 1-2 times/day   Fasting Blood glucose range (  mg/dL) [id number]   Postprandial Blood glucose range (mg/dL) 60-45470-129   Number of hypoglycemic episodes per month 0   Number of hyperglycemic episodes per week 0   Have you had a dilated eye exam in the past 12 months? No   Have you had a dental exam in the past 12 months? Yes   Are you checking your feet? Yes   How many days per week are you checking your feet? 3     Dietary Intake   Breakfast steel cut oats and nuts and kale or  cabbage   Lunch black bean patty and vegetables   Snack (afternoon) vegetables and quinoa   Dinner brown rice and vegetables and beans   Snack (evening) pistachios and walnuts and brown rice and beans and vegetables, vegetable juice   Beverage(s) water, green tea, herb tea     Exercise   Exercise Type Moderate (swimming / aerobic walking)  Focus T25 (high intensity) or trails or treadmill.   How many days per week to you exercise? 7   How many minutes per day do you exercise? 60   Total minutes per week of exercise 420     Patient Education   Previous Diabetes Education No   Disease state  Definition of diabetes, type 1 and 2, and the diagnosis of diabetes;Explored patient's options for treatment of their diabetes   Nutrition management  Role of diet in the treatment of diabetes and the relationship between the three main macronutrients and blood glucose level;Meal options for control of blood glucose level and chronic complications.   Physical activity and exercise  Role of exercise on diabetes management, blood pressure control and cardiac health.   Medications Reviewed patients medication for diabetes, action, purpose, timing of dose and side effects.   Monitoring Identified appropriate SMBG and/or A1C goals.   Chronic complications Relationship between chronic complications and blood glucose control;Dental care;Retinopathy and reason for yearly dilated eye exams   Psychosocial adjustment Role of stress on diabetes   Personal strategies to promote health Helped patient develop diabetes management plan for (enter comment)  glucose control     Individualized Goals (developed by patient)   Nutrition General guidelines for healthy choices and portions discussed   Physical Activity Exercise 5-7 days per week;45 minutes per day;60 minutes per day   Medications take my medication as prescribed   Monitoring  test my blood glucose as discussed   Reducing Risk examine blood glucose  patterns;increase portions of nuts and seeds   Health Coping Not Applicable     Post-Education Assessment   Patient understands the diabetes disease and treatment process. Demonstrates understanding / competency   Patient understands incorporating nutritional management into lifestyle. Demonstrates understanding / competency   Patient undertands incorporating physical activity into lifestyle. Demonstrates understanding / competency   Patient understands using medications safely. Demonstrates understanding / competency   Patient understands monitoring blood glucose, interpreting and using results Demonstrates understanding / competency   Patient understands prevention, detection, and treatment of acute complications. Demonstrates understanding / competency   Patient understands prevention, detection, and treatment of chronic complications. Demonstrates understanding / competency   Patient understands how to develop strategies to address psychosocial issues. Demonstrates understanding / competency   Patient understands how to develop strategies to promote health/change behavior. Demonstrates understanding / competency     Outcomes   Expected Outcomes Demonstrated interest in learning. Expect positive outcomes   Future DMSE PRN   Program Status Completed  Individualized Plan for Diabetes Self-Management Training:   Learning Objective:  Patient will have a greater understanding of diabetes self-management. Patient education plan is to attend individual and/or group sessions per assessed needs and concerns.   Plan:   Patient Instructions  Continue your active lifestyle. You are doing a great job. Continue increasing your non starchy vegetables. Add protein each time you eat (vege meat, tofu, beans, nuts) Continue to check your blood sugar to see the effect of your food and lifestyle.  Resources: [employer] (physicians for responsible medicine) [place]  [email or web address]    Expected Outcomes:  Demonstrated interest in learning. Expect positive outcomes  Education material provided: Living Well with Diabetes, Food label handouts, A1C conversion sheet and Meal plan card  If problems or questions, patient to contact team via:  Phone and Email  Future DSME appointment: PRN

## 2016-06-02 NOTE — Patient Instructions (Addendum)
Continue your active lifestyle. You are doing a great job. Continue increasing your non starchy vegetables. Add protein each time you eat (vege meat, tofu, beans, nuts) Continue to check your blood sugar to see the effect of your food and lifestyle.  Resources: [employer] (physicians for responsible medicine) [place] [email or web address]

## 2016-06-12 ENCOUNTER — Ambulatory Visit: Payer: Medicaid Other

## 2016-06-16 ENCOUNTER — Ambulatory Visit: Payer: Medicaid Other

## 2016-06-18 ENCOUNTER — Encounter (HOSPITAL_BASED_OUTPATIENT_CLINIC_OR_DEPARTMENT_OTHER): Payer: Self-pay | Admitting: *Deleted

## 2016-06-18 ENCOUNTER — Emergency Department (HOSPITAL_BASED_OUTPATIENT_CLINIC_OR_DEPARTMENT_OTHER)
Admission: EM | Admit: 2016-06-18 | Discharge: 2016-06-19 | Disposition: A | Discharge status: Home / Self Care | Payer: Medicaid Other | Attending: Emergency Medicine | Admitting: Emergency Medicine

## 2016-06-18 DIAGNOSIS — E119 Type 2 diabetes mellitus without complications: Secondary | ICD-10-CM | POA: Diagnosis not present

## 2016-06-18 DIAGNOSIS — J40 Bronchitis, not specified as acute or chronic: Secondary | ICD-10-CM | POA: Diagnosis not present

## 2016-06-18 DIAGNOSIS — Z79899 Other long term (current) drug therapy: Secondary | ICD-10-CM | POA: Diagnosis not present

## 2016-06-18 DIAGNOSIS — R05 Cough: Secondary | ICD-10-CM | POA: Diagnosis present

## 2016-06-18 DIAGNOSIS — Z87891 Personal history of nicotine dependence: Secondary | ICD-10-CM | POA: Insufficient documentation

## 2016-06-18 MED ORDER — BENZONATATE 100 MG PO CAPS
100.0000 mg | ORAL_CAPSULE | Freq: Once | ORAL | Status: AC
  Administered 2016-06-19: 100 mg via ORAL
  Filled 2016-06-18: qty 1

## 2016-06-18 MED ORDER — AZITHROMYCIN 250 MG PO TABS
500.0000 mg | ORAL_TABLET | Freq: Once | ORAL | Status: AC
  Administered 2016-06-19: 500 mg via ORAL
  Filled 2016-06-18: qty 2

## 2016-06-18 MED ORDER — ALBUTEROL SULFATE HFA 108 (90 BASE) MCG/ACT IN AERS
2.0000 | INHALATION_SPRAY | RESPIRATORY_TRACT | Status: DC | PRN
  Administered 2016-06-19: 2 via RESPIRATORY_TRACT
  Filled 2016-06-18: qty 6.7

## 2016-06-18 NOTE — ED Triage Notes (Signed)
Cough x 3 weeks, denies fever.

## 2016-06-18 NOTE — ED Provider Notes (Signed)
[facility] Note   CSN: [id number] Arrival date & time: 06/18/16  2204  By signing my name below, I, Sandra [name], attest that this documentation has been prepared under the direction and in the presence of [employer], PA-C. Electronically Signed: Linna [name], Scribe. 06/18/2016. 11:46 PM.  History   Chief Complaint Chief Complaint  Patient presents with  . Cough    The history is provided by the patient. No language interpreter was used.     HPI Comments: Sandra [name] is a 25 y.o. female who presents to the Emergency Department complaining of constant cough for the last two and a half weeks. She notes associated generalized myalgias, neck pain, and chest pain (with coughing). Pt also reports a sore throat that is exacerbated by the cough. She states her eyes have been tinted yellow as well. She notes a h/o bronchitis and states her symptoms feel like bronchitis. She has no known allergies to medications. Pt has not seen any health care providers for her symptoms before today. She does not drink alcohol. Pt has no h/o immunocompromise conditions. Pt denies fever, SOB, wheezing, abdominal pain, rash, or any other associated symptoms.  PCP: Dr. Myrtie Soman  Past Medical History:  Diagnosis Date  . Anemia   . Hemorrhage after delivery of fetus    Intraoperative, received blood transfusion  . History of blood transfusion   . Hx of ectopic pregnancy     Patient Active Problem List   Diagnosis Date Noted  . Diabetes mellitus without complication, without long-term current use of insulin (HCC) 05/26/2016  . Hyperglycemia 04/28/2016  . Anxiety state 04/28/2016  . Yeast vaginitis 04/28/2016  . GERD (gastroesophageal reflux disease) 04/28/2016  . Obesity 01/13/2014  . Halitosis 01/13/2014  . Implanon in place 01/13/2014  . Panic attacks 01/13/2014  . Health care maintenance 01/13/2014    Past Surgical History:  Procedure Laterality Date  . CESAREAN  SECTION    . CESAREAN SECTION  04/23/2012   Procedure: CESAREAN SECTION;  Surgeon: Reva Bores, MD;  Location: [facility];  Service: Gynecology;  Laterality: N/A;  . CHOLECYSTECTOMY  10/23/2012   Procedure: LAPAROSCOPIC CHOLECYSTECTOMY WITH INTRAOPERATIVE CHOLANGIOGRAM;  Surgeon: Valarie Merino, MD;  Location: [facility];  Service: General;  Laterality: N/A;  . LAPAROSCOPY FOR ECTOPIC PREGNANCY      OB History    Gravida Para Term Preterm AB Living   3 2 2   1 2    SAB TAB Ectopic Multiple Live Births       1   2       Home Medications    Prior to Admission medications   Medication Sig Start Date End Date Taking? Authorizing Provider  antiseptic oral rinse (BIOTENE) LIQD 15 mLs by Mouth Rinse route as needed for dry mouth.    Historical Provider, MD  cycloSPORINE (RESTASIS) 0.05 % ophthalmic emulsion Place 1 drop into both eyes 2 (two) times daily.    Historical Provider, MD  Dextromethorphan HBr (VICKS DAYQUIL COUGH) 15 MG/15ML LIQD Take 30 mLs by mouth every 6 (six) hours as needed (cough).    Historical Provider, MD  DM-Doxylamine-Acetaminophen (NYQUIL COLD & FLU PO) Take 30 mLs by mouth at bedtime as needed (cough).    Historical Provider, MD  metFORMIN (GLUCOPHAGE) 500 MG tablet Take 1 tablet (500 mg total) by mouth 2 (two) times daily with a meal. 05/26/16   Renne Musca, MD  Multiple Vitamin (MULTIVITAMIN WITH MINERALS) TABS Take 1 tablet by  mouth daily.    Historical Provider, MD  omeprazole (PRILOSEC) 40 MG capsule Take 1 capsule (40 mg total) by mouth daily. 05/26/16   Renne Muscaaniel L Warden, MD    Family History Family History  Problem Relation Age of Onset  . High blood pressure Mother   . Aneurysm Mother   . Hypertension Mother   . Drug abuse Mother   . Diabetes Mellitus II Father   . Hyperlipidemia Father   . Alcohol abuse Father   . Heart disease Maternal Aunt   . Diabetes Paternal Grandmother   . Diabetes Paternal Grandfather   . Other Neg Hx     Social History Social  History  Substance Use Topics  . Smoking status: Former Games developermoker  . Smokeless tobacco: Never Used  . Alcohol use Yes     Comment: occasionally     Allergies   Shellfish allergy   Review of Systems Review of Systems  Constitutional: Negative for chills, fatigue and fever.  HENT: Positive for congestion and sore throat. Negative for ear pain, rhinorrhea and sinus pressure.   Eyes: Negative for redness.  Respiratory: Positive for cough. Negative for shortness of breath and wheezing.   Cardiovascular: Positive for chest pain (with cough).  Gastrointestinal: Negative for abdominal pain, diarrhea, nausea and vomiting.  Genitourinary: Negative for dysuria.  Musculoskeletal: Positive for myalgias (generalized) and neck pain. Negative for neck stiffness.  Skin: Negative for rash.  Neurological: Negative for headaches.  Hematological: Negative for adenopathy.    Physical Exam Updated Vital Signs BP 136/71 (BP Location: Right Arm)   Pulse 80   Temp 98 F (36.7 C) (Oral)   Resp 18   Ht 4\' 11"  (1.499 m)   Wt 212 lb (96.2 kg)   LMP 05/23/2016   SpO2 100%   BMI 42.82 kg/m   Physical Exam  Constitutional: She appears well-developed and well-nourished. No distress.  HENT:  Head: Normocephalic and atraumatic.  Right Ear: Tympanic membrane, external ear and ear canal normal.  Left Ear: Tympanic membrane, external ear and ear canal normal.  Nose: Nose normal. No mucosal edema or rhinorrhea.  Mouth/Throat: Uvula is midline, oropharynx is clear and moist and mucous membranes are normal. Mucous membranes are not dry. No oral lesions. No trismus in the jaw. No uvula swelling. No oropharyngeal exudate, posterior oropharyngeal edema, posterior oropharyngeal erythema or tonsillar abscesses.  Eyes: Conjunctivae and EOM are normal. Right eye exhibits no discharge. Left eye exhibits no discharge.  No visible jaundice.   Neck: Normal range of motion. Neck supple. No tracheal deviation present.    Cardiovascular: Normal rate, regular rhythm and normal heart sounds.   Pulmonary/Chest: Effort normal and breath sounds normal. No respiratory distress. She has no wheezes. She has no rales.  Abdominal: Soft. There is no tenderness.  Musculoskeletal: Normal range of motion.  Lymphadenopathy:    She has no cervical adenopathy.  Neurological: She is alert.  Skin: Skin is warm and dry.  Psychiatric: She has a normal mood and affect. Her behavior is normal.  Nursing note and vitals reviewed.   ED Treatments / Results   Procedures Procedures (including critical care time)  DIAGNOSTIC STUDIES: Oxygen Saturation is 100% on RA, normal by my interpretation.    COORDINATION OF CARE: 11:51 PM Will administer azithromycin, albuterol, and tessalon. Discussed treatment plan with pt at bedside and pt agreed to plan.  Medications Ordered in ED Medications - No data to display   Patient counseled on use of albuterol HFA. Instructed  to use 1-2 puffs q 4 hours as needed for SOB.  Encouraged return to the emergency department with high fever, worsening shortness of breath, increased work of breathing, new symptoms or other concerns. Patient verbalizes understanding and agrees with plan.  Initial Impression / Assessment and Plan / ED Course  I have reviewed the triage vital signs and the nursing notes.  Pertinent labs & imaging results that were available during my care of the patient were reviewed by me and considered in my medical decision making (see chart for details).  Clinical Course   I personally performed the services described in this documentation, which was scribed in my presence. The recorded information has been reviewed and is accurate.   Final Clinical Impressions(s) / ED Diagnoses   Final diagnoses:  Bronchitis   Patient with 3 weeks of cough, no fever. Suspect bronchitis. Given duration of symptoms greater than 10 days, feel that azithromycin trial is indicated. Albuterol  inhaler and Tessalon for symptomatically treatment. Return instructions as above.  New Prescriptions Discharge Medication List as of 06/19/2016 12:13 AM    START taking these medications   Details  azithromycin (ZITHROMAX) 250 MG tablet Take 1 tablet (250 mg total) by mouth daily., Starting Mon 06/19/2016, Print    benzonatate (TESSALON) 100 MG capsule Take 1 capsule (100 mg total) by mouth every 8 (eight) hours., Starting Mon 06/19/2016, Print         Renne Crigler, PA-C 06/19/16 0126    Paula Libra, MD 06/19/16 [phone]

## 2016-06-19 ENCOUNTER — Telehealth: Payer: Self-pay | Admitting: Family Medicine

## 2016-06-19 MED ORDER — AZITHROMYCIN 250 MG PO TABS: 250.0000 mg | ORAL_TABLET | Freq: Every day | ORAL | 0 refills | Status: DC

## 2016-06-19 MED ORDER — BENZONATATE 100 MG PO CAPS: 100.0000 mg | ORAL_CAPSULE | Freq: Three times a day (TID) | ORAL | 0 refills | Status: DC

## 2016-06-19 NOTE — Telephone Encounter (Signed)
Pt needs a refill on test strips and diflucan. Pt was seen in the ER last night, was given a lot of new medications and wants to discuss them with her pcp. Please advise. Thanks! ep

## 2016-06-19 NOTE — Discharge Instructions (Signed)
Please read and follow all provided instructions.  Your diagnoses today include:  1. Bronchitis    Tests performed today include:  Vital signs. See below for your results today.   Medications prescribed:   Albuterol inhaler - medication that opens up your airway  Use inhaler as follows: 1-2 puffs with spacer every 4 hours as needed for wheezing, cough, or shortness of breath.    Tessalon Perles - cough suppressant medication   Azithromycin - antibiotic for respiratory infection  You have been prescribed an antibiotic medicine: take the entire course of medicine even if you are feeling better. Stopping early can cause the antibiotic not to work.  Take any prescribed medications only as directed.    Home care instructions:  Follow any educational materials contained in this packet.  Follow-up instructions: Please follow-up with your primary care provider in the next 3 days for further evaluation of your symptoms and a recheck if you are not feeling better.   Return instructions:   Please return to the Emergency Department if you experience worsening symptoms.  Please return with worsening wheezing, shortness of breath, or difficulty breathing.  Return with persistent fever above 101F.   Please return if you have any other emergent concerns.  Additional Information:  Your vital signs today were: BP 132/91 (BP Location: Left Arm)    Pulse 73    Temp 98 F (36.7 C) (Oral)    Resp 18    Ht 4\' 11"  (1.499 m)    Wt 96.2 kg    LMP 05/23/2016    SpO2 100%    BMI 42.82 kg/m  If your blood pressure (BP) was elevated above 135/85 this visit, please have this repeated by your doctor within one month. --------------

## 2016-07-06 ENCOUNTER — Ambulatory Visit (INDEPENDENT_AMBULATORY_CARE_PROVIDER_SITE_OTHER): Payer: Medicaid Other | Admitting: Family Medicine

## 2016-07-06 ENCOUNTER — Encounter: Payer: Self-pay | Admitting: Family Medicine

## 2016-07-06 ENCOUNTER — Other Ambulatory Visit (HOSPITAL_COMMUNITY)
Admission: RE | Admit: 2016-07-06 | Discharge: 2016-07-06 | Disposition: A | Discharge status: Home / Self Care | Payer: Medicaid Other | Source: Ambulatory Visit | Attending: Family Medicine | Admitting: Family Medicine

## 2016-07-06 VITALS — BP 116/64 | HR 81 | Temp 98.4°F | Ht 59.0 in | Wt 201.1 lb

## 2016-07-06 DIAGNOSIS — E119 Type 2 diabetes mellitus without complications: Secondary | ICD-10-CM

## 2016-07-06 DIAGNOSIS — Z113 Encounter for screening for infections with a predominantly sexual mode of transmission: Secondary | ICD-10-CM | POA: Insufficient documentation

## 2016-07-06 DIAGNOSIS — E1310 Other specified diabetes mellitus with ketoacidosis without coma: Secondary | ICD-10-CM | POA: Diagnosis not present

## 2016-07-06 DIAGNOSIS — N898 Other specified noninflammatory disorders of vagina: Secondary | ICD-10-CM | POA: Diagnosis present

## 2016-07-06 DIAGNOSIS — M7660 Achilles tendinitis, unspecified leg: Secondary | ICD-10-CM | POA: Diagnosis not present

## 2016-07-06 DIAGNOSIS — E1110 Type 2 diabetes mellitus with ketoacidosis without coma: Secondary | ICD-10-CM

## 2016-07-06 LAB — POCT WET PREP (WET MOUNT)
CLUE CELLS WET PREP WHIFF POC: NEGATIVE
Trichomonas Wet Prep HPF POC: ABSENT

## 2016-07-06 LAB — POCT URINE PREGNANCY: PREG TEST UR: NEGATIVE

## 2016-07-06 NOTE — Progress Notes (Signed)
Subjective:  Sandra [name] is a 25 y.o. female who presents to the [facility] today for diabetes follow-up and to discuss vaginal discharge.  HPI:  Vaginal discharge: Patient reportedly had an unprotected sex about a month ago, and the condom may have broken. She comes in today with a white vaginal discharge for the past week, without dysuria or abdominal pain. She denies any fevers or chills. Her last Pap smear was on 04/28/2016, and HIV screening was on 04/22/2012.   Type 2 diabetes: Recent diagnosis of type 2 diabetes in September and hemoglobin A1c at that point was 8.  Patient has been on metformin 500 mg twice a day and stated that she has begun exercising and has followed the dietary recommendations given by myself and dietitian.  She denies any polyuria, polydipsia, polyphagia. She requested prescription for test strips as she was advised that she should be checking her sugars daily by the diabetes educator.    Achilles tendinitis: She recently started exercising and notes that she has had pain on her Achilles tendon for the past couple of weeks. Pain is worst when she is pointing her toe and is improved with rest. She has tried ibuprofen, Tylenol, ice without much improvement. She denies any trauma to the area, or any numbness or tingling.    ROS: See history of present illness  past medical history: Type 2 diabetes, obesity, GERD  Objective:  Physical Exam: BP 116/64   Pulse 81   Temp 98.4 F (36.9 C) (Oral)   Ht 4\' 11"  (1.499 m)   Wt 201 lb 1.6 oz (91.2 kg)   LMP 06/23/2016 (Approximate)   SpO2 99%   BMI 40.62 kg/m   Gen: 25 year old female in NAD, resting comfortably CV: RRR with no murmurs appreciated Pulm: NWOB, CTAB with no crackles, wheezes, or rhonchi GI: Normal bowel sounds present. Soft, Nontender, Nondistended. MSK: no edema, cyanosis, or clubbing noted, right Achilles tendon tender to palpation, and pain with dorsiflexion. Skin: warm, dry GU: Normal external  female anatomy with no lesions noted, normal-appearing cervix, moderate amount of malodorous white discharge Neuro: grossly normal, moves all extremities Psych: Normal affect and thought content  Results for orders placed or performed in visit on 07/06/16 (from the past 72 hour(s))  Cervicovaginal ancillary only     Status: None   Collection Time: 07/06/16 12:00 AM  Result Value Ref Range   Chlamydia Negative     Comment: Normal Reference Range - Negative   Neisseria gonorrhea Negative     Comment: Normal Reference Range - Negative  POCT Wet Prep Sandra [name] [place])     Status: Abnormal   Collection Time: 07/06/16  2:56 PM  Result Value Ref Range   Source Wet Prep POC VAG    WBC, Wet Prep HPF POC 0-3    Bacteria Wet Prep HPF POC Moderate (A) None, Few, Too numerous to count   Clue Cells Wet Prep HPF POC None None, Too numerous to count   Clue Cells Wet Prep Whiff POC Negative Whiff    Yeast Wet Prep HPF POC None    Trichomonas Wet Prep HPF POC Absent Absent  HIV antibody (with reflex)     Status: None   Collection Time: 07/06/16  3:24 PM  Result Value Ref Range   HIV 1&2 Ab, 4th Generation NONREACTIVE NONREACTIVE    Comment:   HIV-1 antigen and HIV-1/HIV-2 antibodies were not detected.  There is no laboratory evidence of HIV infection.   HIV-1/2 Antibody Diff  Not indicated. HIV-1 RNA, Qual TMA          Not indicated.     PLEASE NOTE: This information has been disclosed to you from records whose confidentiality may be protected by state law. If your state requires such protection, then the state law prohibits you from making any further disclosure of the information without the specific written consent of the person to whom it pertains, or as otherwise permitted by law. A general authorization for the release of medical or other information is NOT sufficient for this purpose.   The performance of this assay has not been clinically validated in patients less than 2 years  old.   For additional information please refer to http://education.questdiagnostics.com/faq/FAQ106.  (This link is being provided for informational/educational purposes only.)     Microalbumin / creatinine urine ratio     Status: Abnormal   Collection Time: 07/06/16  3:46 PM  Result Value Ref Range   Creatinine, Urine 375 (H) 20 - 320 mg/dL    Comment: Result confirmed by automatic dilution. Result repeated and verified.    Microalb, Ur 8.7 Not estab mg/dL   Microalb Creat Ratio 23 <30 mcg/mg creat    Comment: The ADA has defined abnormalities in albumin excretion as follows:           Category           Result                            (mcg/mg creatinine)                 Normal:    <30       Microalbuminuria:    30 - 299   Clinical albuminuria:    > or = 300   The ADA recommends that at least two of three specimens collected within a 3 - 6 month period be abnormal before considering a patient to be within a diagnostic category.     POCT urine pregnancy     Status: None   Collection Time: 07/06/16  4:30 PM  Result Value Ref Range   Preg Test, Ur Negative Negative     Assessment/Plan:  Vaginal discharge One week of vaginal discharge, without fevers or dysuria.  Patient is in committed relationship, but did endorse risky sexual behavior 1 month ago where condom may have broken.  Aside from malodorous white vaginal discharge exam was normal. - Follow-up GC chlamydia, HIV, wet prep  Diabetes mellitus without complication, without long-term current use of insulin (HCC) Patient reports that blood sugars are typically between 90 and 120. Is on metformin 500 mg twice a day. Last A1c in September was 8.  Denies any hyperglycemic symptoms. States that she is working hard on her diet, and exercise in hopes that at some point she can come off metformin and have a good A1c.  - Continue on metformin 500 mg twice a day - Follow-up microalbumin/creatinine ratio - We'll be rechecking  hemoglobin A1c in December  Achilles tendon pain Patient is having pain on her Achilles tendon for the past couple of weeks. Clearly painful with dorsiflexion and tender to palpation.  - Recommended rest, ice, NSAIDs and various exercises including calf stretch and bilateral heel drop

## 2016-07-06 NOTE — Patient Instructions (Signed)
Today we discussed some vaginal discharge that you have been having and we did an exam and some tests.  I will call you with these results and prescribe you medication if they show anything.   Also, I will refer you to a ear nose and throat surgeon to investigate your halitosis.   We also tested your urine for protein to see if there is any damage from your diabetes (I suspect NOT because you were just diagnosed).   It was a pleasure seeing you today!  Daniel L. Myrtie SomanWarden, MD [facility] Resident PGY-1 07/06/2016 3:35 PM

## 2016-07-07 ENCOUNTER — Other Ambulatory Visit: Payer: Self-pay | Admitting: Family Medicine

## 2016-07-07 DIAGNOSIS — N898 Other specified noninflammatory disorders of vagina: Secondary | ICD-10-CM | POA: Insufficient documentation

## 2016-07-07 DIAGNOSIS — M7660 Achilles tendinitis, unspecified leg: Secondary | ICD-10-CM | POA: Insufficient documentation

## 2016-07-07 LAB — CERVICOVAGINAL ANCILLARY ONLY
CHLAMYDIA, DNA PROBE: NEGATIVE
Neisseria Gonorrhea: NEGATIVE

## 2016-07-07 LAB — MICROALBUMIN / CREATININE URINE RATIO
CREATININE, URINE: 375 mg/dL — AB (ref 20–320)
MICROALB UR: 8.7 mg/dL
Microalb Creat Ratio: 23 mcg/mg creat (ref <30)

## 2016-07-07 LAB — HIV ANTIBODY (ROUTINE TESTING W REFLEX): HIV 1&2 Ab, 4th Generation: NONREACTIVE

## 2016-07-07 MED ORDER — GLUCOSE BLOOD VI STRP: ORAL_STRIP | 12 refills | Status: DC

## 2016-07-07 NOTE — Assessment & Plan Note (Signed)
One week of vaginal discharge, without fevers or dysuria.  Patient is in committed relationship, but did endorse risky sexual behavior 1 month ago where condom may have broken.  Aside from malodorous white vaginal discharge exam was normal. - Follow-up GC chlamydia, HIV, wet prep

## 2016-07-07 NOTE — Assessment & Plan Note (Signed)
Patient is having pain on her Achilles tendon for the past couple of weeks. Clearly painful with dorsiflexion and tender to palpation.  - Recommended rest, ice, NSAIDs and various exercises including calf stretch and bilateral heel drop

## 2016-07-07 NOTE — Assessment & Plan Note (Signed)
Patient reports that blood sugars are typically between 90 and 120. Is on metformin 500 mg twice a day. Last A1c in September was 8.  Denies any hyperglycemic symptoms. States that she is working hard on her diet, and exercise in hopes that at some point she can come off metformin and have a good A1c.  - Continue on metformin 500 mg twice a day - Follow-up microalbumin/creatinine ratio - We'll be rechecking hemoglobin A1c in December

## 2016-07-10 ENCOUNTER — Other Ambulatory Visit: Payer: Self-pay | Admitting: Family Medicine

## 2016-07-10 DIAGNOSIS — R196 Halitosis: Secondary | ICD-10-CM

## 2016-07-11 ENCOUNTER — Telehealth: Payer: Self-pay | Admitting: Family Medicine

## 2016-07-11 NOTE — Telephone Encounter (Signed)
Pt would like someone to contact her regarding lab results. Please advise. Thanks! ep

## 2016-07-11 NOTE — Telephone Encounter (Signed)
Spoke with patient regarding results and that everything is normal.  She will follow up at her next scheduled appointment for an A1C check.

## 2016-07-31 ENCOUNTER — Ambulatory Visit (INDEPENDENT_AMBULATORY_CARE_PROVIDER_SITE_OTHER): Payer: Medicaid Other | Admitting: Psychology

## 2016-07-31 DIAGNOSIS — F411 Generalized anxiety disorder: Secondary | ICD-10-CM

## 2016-07-31 NOTE — Progress Notes (Signed)
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 She also practiced being assertive with rude coworker with mixed results. We roleplayed assertiveness with coworker and talked about how some comments she hears are overt whereas others are ambiguous.   Identified goals:  Patient will spend more time with coworkers she gets along with when she feels insulted by others. She will use her headspace meditation app to relax during her breaks. [facility] and challenge her thoughts and rumination about ambiguous insults she hears.

## 2016-07-31 NOTE — Assessment & Plan Note (Signed)
Assessment/Plan/Recommendations: [facility] given starting work again and hearing coworkers talking bad about her. Patient made a new friend at work who is overweight and talks to the patient about how in the past, she worried that people were talking bad about her, but is not the case. Patient tried being assertive with her International aid/development workerassistant manager which she reports felt empowering, but felt uncomfortable by assistant manager's defensive response and ended up apologizing. Patient is open to questioning whether people are actually insulting her or whether it is "in her head". [facility] discussed how past experiences can affect how we perceive other's words. Patient agreed to challenge thoughts and rumination during next week's session. In the meantime, patient plans to use relaxation (meditation) and spend time with coworkers she likes when she feels like ruminating.

## 2016-08-07 ENCOUNTER — Telehealth: Payer: Self-pay | Admitting: Psychology

## 2016-08-07 ENCOUNTER — Ambulatory Visit: Payer: Medicaid Other

## 2016-08-07 NOTE — Telephone Encounter (Signed)
Patient missed Behavioral Health appointment. [facility] called and left message encouraging patient to reschedule with front office.

## 2016-08-14 ENCOUNTER — Ambulatory Visit (INDEPENDENT_AMBULATORY_CARE_PROVIDER_SITE_OTHER): Payer: Medicaid Other | Admitting: Psychology

## 2016-08-14 DIAGNOSIS — F411 Generalized anxiety disorder: Secondary | ICD-10-CM

## 2016-08-14 NOTE — Assessment & Plan Note (Signed)
Assessment/Plan/Recommendations: Patient is nervous about not meeting demands at new job at [employer] and that coworkers will stop liking her once they get to know her better. Patient also feels taunted and uncomfortable at [employer] workplace but has requested less hours to focus on full-time work at [employer]. She has been working on challenging her thoughts and generating alternate explanations when she thinks others are talking badly about her (came up with this on her own) [place] [employer], but feels that her manager at [employer] truly is taunting her. We engaged in problem solving but patient feels that she cannot file a complaint, talk to the manager, share with coworkers, but will try to ignore. Patient has been reading a book encouraging her to worry less and not care what other's think. She reports this strategy of behaving confidently has been helping and will continue to do so. We discussed continuing to challenge thoughts and behave confidently and to test belief that [employer] employees will begin to dislike her by spending more time in break room with others rather than eating lunch by herself. Patient would like to return in two weeks.

## 2016-08-14 NOTE — Progress Notes (Signed)
Reason for follow-up:  Patient is always on alert that others will make fun of her body odor. She is interested in challenging these thoughts  Issues discussed:  We discussed the CBT model of thoughts, feelings, and behavior and practiced challenging automatic thoughts. We discussed patient issues at her current part-time job and fears with full-time job.   Identified goals:  When worried that others talking negatively about her, patient will ask "Can I be absolutely sure they are talking about me?", "How does this thought affect me?", "What would I be like without the thought", and "Are there any other explanations for what happened?". Patient will continue to behave confidently at her place of work even if she feels insecure. Patient will return in 2 weeks.

## 2016-08-28 ENCOUNTER — Ambulatory Visit: Payer: Medicaid Other

## 2016-09-04 ENCOUNTER — Ambulatory Visit (INDEPENDENT_AMBULATORY_CARE_PROVIDER_SITE_OTHER): Payer: Medicaid Other | Admitting: Psychology

## 2016-09-04 DIAGNOSIS — F411 Generalized anxiety disorder: Secondary | ICD-10-CM

## 2016-09-04 NOTE — Progress Notes (Signed)
Reason for follow-up:  Patient experiencing stressors at work and difficulty with manager  Issues discussed:  Patient has recently quit job at [employer] due to difficulties with manager, making progress at other job, and discussed difficulties with healthy food choices over the holidays.  Identified goals:  Patient will use her new smart watch to track food intake, and plans to renew eating well. She will also return call to supervisor at [employer] to let her know why she quit--sharing only what she feels comfortable. She will return 09/25/16.

## 2016-09-04 NOTE — Assessment & Plan Note (Signed)
Assessment/Plan/Recommendations: Patient recently quit job at [employer] somewhat impulsively as she could no longer ignore manager's rudeness. She is doing well at job at [employer] (production is speeding up, receiving praise from supervisors) though still little nervous about slower speed of food prep. Patient also has been making poorer food choices and wishes to resume healthy eating. Her motivations include: weight loss, feeling more energy, and having better skin. Patient has not yet returned calls from [employer] after quitting for fear of getting manager into trouble. We discussed how she can be honest (e.g., difficulties with some coworkers) without sharing detail that she doesn't want to. When asked about use of a food diary, patient discussed a feature on her smart watch that let's her track food intake. We discussed shame felt in logging food honestly when not meeting goals and value of learning from food log information. Patient will return 1/8.

## 2016-09-25 ENCOUNTER — Ambulatory Visit: Payer: Medicaid Other

## 2016-10-19 ENCOUNTER — Emergency Department (HOSPITAL_BASED_OUTPATIENT_CLINIC_OR_DEPARTMENT_OTHER)
Admission: EM | Admit: 2016-10-19 | Discharge: 2016-10-20 | Disposition: A | Discharge status: Home / Self Care | Payer: Medicaid Other | Attending: Emergency Medicine | Admitting: Emergency Medicine

## 2016-10-19 ENCOUNTER — Encounter (HOSPITAL_BASED_OUTPATIENT_CLINIC_OR_DEPARTMENT_OTHER): Payer: Self-pay

## 2016-10-19 DIAGNOSIS — Z79899 Other long term (current) drug therapy: Secondary | ICD-10-CM | POA: Diagnosis not present

## 2016-10-19 DIAGNOSIS — Z87891 Personal history of nicotine dependence: Secondary | ICD-10-CM | POA: Diagnosis not present

## 2016-10-19 DIAGNOSIS — Z7984 Long term (current) use of oral hypoglycemic drugs: Secondary | ICD-10-CM | POA: Insufficient documentation

## 2016-10-19 DIAGNOSIS — R072 Precordial pain: Secondary | ICD-10-CM | POA: Diagnosis present

## 2016-10-19 DIAGNOSIS — K219 Gastro-esophageal reflux disease without esophagitis: Secondary | ICD-10-CM | POA: Diagnosis not present

## 2016-10-19 MED ORDER — GI COCKTAIL ~~LOC~~
30.0000 mL | Freq: Once | ORAL | Status: AC
  Administered 2016-10-19: 30 mL via ORAL
  Filled 2016-10-19: qty 30

## 2016-10-19 NOTE — ED Notes (Signed)
Mid sternal cp x 1.5 hours  Pain increased w movement and inspiration  Hx of same

## 2016-10-19 NOTE — ED Triage Notes (Signed)
CP x 1 year-felt like heartburn-took omeprazole w/o relief-NAD-steady gait

## 2016-10-20 MED ORDER — SUCRALFATE 1 G PO TABS: ORAL_TABLET | ORAL | 0 refills | Status: DC

## 2016-10-20 NOTE — ED Provider Notes (Addendum)
[facility] Provider Note: Lowella DellJ. Lane Wister Hoefle, MD, FACEP  CSN: [id number] MRN: [id number] ARRIVAL: 10/19/16 at 2250 ROOM: MH09/MH09   CHIEF COMPLAINT  Chest Pain   HISTORY OF PRESENT ILLNESS  Sandra BoerJennifer [name] is a 26 y.o. female with a history of heartburn. She is here with substernal pain that began while she was lying down after eating. She describes the pain as a burning sensation. It is like previous heartburn but worse. It was worse with movement or deep breathing. She rated it an 8 out of 10 on arrival. There was some associated nausea but no vomiting. She also felt somewhat short of breath. She was given a GI cocktail by her nurse which reduced her discomfort to 2 out of 10. She took omeprazole prior to arrival without relief.   Past Medical History:  Diagnosis Date  . Anemia   . Hemorrhage after delivery of fetus    Intraoperative, received blood transfusion  . History of blood transfusion   . Hx of ectopic pregnancy     Past Surgical History:  Procedure Laterality Date  . CESAREAN SECTION    . CESAREAN SECTION  04/23/2012   Procedure: CESAREAN SECTION;  Surgeon: Reva Boresanya S Pratt, MD;  Location: [facility];  Service: Gynecology;  Laterality: N/A;  . CHOLECYSTECTOMY  10/23/2012   Procedure: LAPAROSCOPIC CHOLECYSTECTOMY WITH INTRAOPERATIVE CHOLANGIOGRAM;  Surgeon: Valarie MerinoMatthew B Martin, MD;  Location: [facility];  Service: General;  Laterality: N/A;  . LAPAROSCOPY FOR ECTOPIC PREGNANCY      Family History  Problem Relation Age of Onset  . High blood pressure Mother   . Aneurysm Mother   . Hypertension Mother   . Drug abuse Mother   . Diabetes Mellitus II Father   . Hyperlipidemia Father   . Alcohol abuse Father   . Heart disease Maternal Aunt   . Diabetes Paternal Grandmother   . Diabetes Paternal Grandfather   . Other Neg Hx     Social History  Substance Use Topics  . Smoking status: Former Games developermoker  . Smokeless tobacco: Never Used  . Alcohol use Yes     Comment:  occasionally    Prior to Admission medications   Medication Sig Start Date End Date Taking? Authorizing Provider  glucose blood (ACCU-CHEK GUIDE) test strip Use as instructed 07/07/16   Renne Muscaaniel L Warden, MD  metFORMIN (GLUCOPHAGE) 500 MG tablet Take 1 tablet (500 mg total) by mouth 2 (two) times daily with a meal. 05/26/16   Renne Muscaaniel L Warden, MD  omeprazole (PRILOSEC) 40 MG capsule Take 1 capsule (40 mg total) by mouth daily. 05/26/16   Renne Muscaaniel L Warden, MD    Allergies Shellfish allergy   REVIEW OF SYSTEMS  Negative except as noted here or in the History of Present Illness.   PHYSICAL EXAMINATION  Initial Vital Signs Blood pressure 132/76, pulse 96, temperature 98.2 F (36.8 C), temperature source Oral, resp. rate 18, height 4\' 11"  (1.499 m), weight 206 lb (93.4 kg), last menstrual period 10/10/2016, SpO2 100 %.  Examination General: Well-developed, well-nourished female in no acute distress; appearance consistent with age of record HENT: normocephalic; atraumatic Eyes: pupils equal, round and reactive to light; extraocular muscles intact Neck: supple Heart: regular rate and rhythm Lungs: clear to auscultation bilaterally Abdomen: soft; nondistended; nontender; no masses or hepatosplenomegaly; bowel sounds present Extremities: No deformity; full range of motion; pulses normal Neurologic: Awake, alert and oriented; motor function intact in all extremities and symmetric; no facial droop Skin: Warm and dry Psychiatric:  Normal mood and affect   RESULTS  Summary of this visit's results, reviewed by myself:   EKG Interpretation  Date/Time:  Thursday October 19 2016 22:59:00 EST Ventricular Rate:  105 PR Interval:  138 QRS Duration: 84 QT Interval:  328 QTC Calculation: 433 R Axis:   85 Text Interpretation:  Sinus tachycardia Possible Left atrial enlargement Borderline ECG No previous ECGs available Confirmed by Earlie Arciga  MD, Jonny Ruiz ([id number]) on 10/19/2016 10:59:21 PM      Laboratory  Studies: No results found for this or any previous visit (from the past 24 hour(s)). Imaging Studies: No results found.  ED COURSE  Nursing notes and initial vitals signs, including pulse oximetry, reviewed.  Vitals:   10/19/16 2257  BP: 132/76  Pulse: 96  Resp: 18  Temp: 98.2 F (36.8 C)  TempSrc: Oral  SpO2: 100%  Weight: 206 lb (93.4 kg)  Height: 4\' 11"  (1.499 m)   She is artery on omeprazole. We will switch her omeprazole to mornings and add Carafate.  PROCEDURES    ED DIAGNOSES     ICD-9-CM ICD-10-CM   1. Gastroesophageal reflux disease, esophagitis presence not specified 530.81 K21.9        Paula Libra, MD 10/20/16 0034    Paula Libra, MD 10/20/16 [phone]

## 2017-05-18 ENCOUNTER — Encounter (HOSPITAL_BASED_OUTPATIENT_CLINIC_OR_DEPARTMENT_OTHER): Payer: Self-pay | Admitting: Emergency Medicine

## 2017-05-18 ENCOUNTER — Other Ambulatory Visit: Payer: Self-pay | Admitting: Family Medicine

## 2017-05-18 ENCOUNTER — Emergency Department (HOSPITAL_BASED_OUTPATIENT_CLINIC_OR_DEPARTMENT_OTHER)
Admission: EM | Admit: 2017-05-18 | Discharge: 2017-05-18 | Disposition: A | Discharge status: Home / Self Care | Payer: Medicaid Other | Attending: Emergency Medicine | Admitting: Emergency Medicine

## 2017-05-18 ENCOUNTER — Telehealth: Payer: Self-pay | Admitting: Family Medicine

## 2017-05-18 DIAGNOSIS — R42 Dizziness and giddiness: Secondary | ICD-10-CM | POA: Diagnosis present

## 2017-05-18 DIAGNOSIS — F410 Panic disorder [episodic paroxysmal anxiety] without agoraphobia: Secondary | ICD-10-CM | POA: Insufficient documentation

## 2017-05-18 DIAGNOSIS — Z79899 Other long term (current) drug therapy: Secondary | ICD-10-CM | POA: Insufficient documentation

## 2017-05-18 DIAGNOSIS — Z87891 Personal history of nicotine dependence: Secondary | ICD-10-CM | POA: Diagnosis not present

## 2017-05-18 DIAGNOSIS — E119 Type 2 diabetes mellitus without complications: Secondary | ICD-10-CM | POA: Insufficient documentation

## 2017-05-18 DIAGNOSIS — Z7984 Long term (current) use of oral hypoglycemic drugs: Secondary | ICD-10-CM | POA: Diagnosis not present

## 2017-05-18 HISTORY — DX: Gastro-esophageal reflux disease without esophagitis: K21.9

## 2017-05-18 HISTORY — DX: Type 2 diabetes mellitus without complications: E11.9

## 2017-05-18 MED ORDER — FLUCONAZOLE 150 MG PO TABS: 150.0000 mg | ORAL_TABLET | Freq: Once | ORAL | 0 refills | Status: AC

## 2017-05-18 NOTE — Telephone Encounter (Signed)
Placed order for diflucan 150mg  once.   Wava Kildow L. Myrtie SomanWarden, MD [facility] Resident PGY-2 05/18/2017 3:54 PM

## 2017-05-18 NOTE — ED Provider Notes (Signed)
[facility] Provider Note   CSN: [id number] Arrival date & time: 05/18/17  1930     History   Chief Complaint Chief Complaint  Patient presents with  . Dizziness    HPI Sandra [name] is a 26 y.o. female.Chief complaint is woozy  HPI  this is a 26 year old female. Has a history of panic attacks. Had an episode today where she felt rather sudden dizziness. Describes as a lightheadedness. Started with her neck feeling tight. No sore breath. Her pulses became sweaty. All her symptoms and almost resolved. She states she was here "about getting by A1c checked and thought it might be my blood sugar. History of diabetes. On metformin.  Past Medical History:  Diagnosis Date  . Anemia   . Diabetes mellitus without complication (HCC)   . GERD (gastroesophageal reflux disease)   . Hemorrhage after delivery of fetus    Intraoperative, received blood transfusion  . History of blood transfusion   . Hx of ectopic pregnancy     Patient Active Problem List   Diagnosis Date Noted  . Vaginal discharge 07/07/2016  . Achilles tendon pain 07/07/2016  . Diabetes mellitus without complication, without long-term current use of insulin (HCC) 05/26/2016  . Hyperglycemia 04/28/2016  . Anxiety state 04/28/2016  . Yeast vaginitis 04/28/2016  . GERD (gastroesophageal reflux disease) 04/28/2016  . Obesity 01/13/2014  . Halitosis 01/13/2014  . Implanon in place 01/13/2014  . Panic attacks 01/13/2014  . Health care maintenance 01/13/2014    Past Surgical History:  Procedure Laterality Date  . CESAREAN SECTION    . CESAREAN SECTION  04/23/2012   Procedure: CESAREAN SECTION;  Surgeon: Reva Boresanya S Pratt, MD;  Location: [facility];  Service: Gynecology;  Laterality: N/A;  . CHOLECYSTECTOMY  10/23/2012   Procedure: LAPAROSCOPIC CHOLECYSTECTOMY WITH INTRAOPERATIVE CHOLANGIOGRAM;  Surgeon: Valarie MerinoMatthew B Martin, MD;  Location: [facility];  Service: General;  Laterality: N/A;  . LAPAROSCOPY FOR ECTOPIC PREGNANCY       OB History    Gravida Para Term Preterm AB Living   3 2 2   1 2    SAB TAB Ectopic Multiple Live Births       1   2       Home Medications    Prior to Admission medications   Medication Sig Start Date End Date Taking? Authorizing Provider  fluconazole (DIFLUCAN) 150 MG tablet Take 1 tablet (150 mg total) by mouth once. 05/18/17 05/18/17  Renne MuscaWarden, Daniel L, MD  glucose blood (ACCU-CHEK GUIDE) test strip Use as instructed 07/07/16   Renne MuscaWarden, Daniel L, MD  metFORMIN (GLUCOPHAGE) 500 MG tablet Take 1 tablet (500 mg total) by mouth 2 (two) times daily with a meal. 05/26/16   Renne MuscaWarden, Daniel L, MD  omeprazole (PRILOSEC) 40 MG capsule Take 1 capsule (40 mg total) by mouth daily. 05/26/16   Renne MuscaWarden, Daniel L, MD  sucralfate (CARAFATE) 1 g tablet Take one tablet crushed 3 times daily with meals and at bedtime. Take omeprazole every morning at least 30 minutes before first dose of Carafate. 10/20/16   Molpus, John, MD    Family History Family History  Problem Relation Age of Onset  . High blood pressure Mother   . Aneurysm Mother   . Hypertension Mother   . Drug abuse Mother   . Diabetes Mellitus II Father   . Hyperlipidemia Father   . Alcohol abuse Father   . Heart disease Maternal Aunt   . Diabetes Paternal Grandmother   . Diabetes Paternal  Grandfather   . Other Neg Hx     Social History Social History  Substance Use Topics  . Smoking status: Former Games developer  . Smokeless tobacco: Never Used  . Alcohol use Yes     Comment: occasionally     Allergies   Shellfish allergy   Review of Systems Review of Systems  Constitutional: Positive for fatigue. Negative for appetite change, chills, diaphoresis and fever.  HENT: Negative for mouth sores, sore throat and trouble swallowing.   Eyes: Negative for visual disturbance.  Respiratory: Negative for cough, chest tightness, shortness of breath and wheezing.   Cardiovascular: Negative for chest pain.  Gastrointestinal: Negative for  abdominal distention, abdominal pain, diarrhea, nausea and vomiting.  Endocrine: Negative for polydipsia, polyphagia and polyuria.  Genitourinary: Negative for dysuria, frequency and hematuria.  Musculoskeletal: Negative for gait problem.  Skin: Negative for color change, pallor and rash.  Neurological: Positive for dizziness. Negative for syncope, light-headedness and headaches.  Hematological: Does not bruise/bleed easily.  Psychiatric/Behavioral: Negative for behavioral problems and confusion.     Physical Exam Updated Vital Signs BP 135/83 (BP Location: Right Arm)   Pulse 86   Temp 99.1 F (37.3 C) (Oral)   Resp (!) 22   Ht 4\' 11"  (1.499 m)   Wt 117.5 kg (259 lb)   SpO2 100%   BMI 52.31 kg/m   Physical Exam  Constitutional: She is oriented to person, place, and time. She appears well-developed and well-nourished. No distress.  HENT:  Head: Normocephalic.  Eyes: Pupils are equal, round, and reactive to light. Conjunctivae are normal. No scleral icterus.  Neck: Normal range of motion. Neck supple. No thyromegaly present.  Cardiovascular: Normal rate and regular rhythm.  Exam reveals no gallop and no friction rub.   No murmur heard. Pulmonary/Chest: Effort normal and breath sounds normal. No respiratory distress. She has no wheezes. She has no rales.  Abdominal: Soft. Bowel sounds are normal. She exhibits no distension. There is no tenderness. There is no rebound.  Musculoskeletal: Normal range of motion.  Neurological: She is alert and oriented to person, place, and time.  Skin: Skin is warm and dry. No rash noted.  Psychiatric: She has a normal mood and affect. Her behavior is normal.     ED Treatments / Results  Labs (all labs ordered are listed, but only abnormal results are displayed) Labs Reviewed - No data to display  EKG  EKG Interpretation None       Radiology No results found.  Procedures Procedures (including critical care time)  Medications  Ordered in ED Medications - No data to display   Initial Impression / Assessment and Plan / ED Course  I have reviewed the triage vital signs and the nursing notes.  Pertinent labs & imaging results that were available during my care of the patient were reviewed by me and considered in my medical decision making (see chart for details).    Not tachycardic. Not hypoxemic. Amiodarone without symptoms. EKG without acute or ischemic changes. Normal pulmonary lung exam. I think this is very likely panic attack. It started resolved spontaneously. Prepared for discharge without further studies.  Final Clinical Impressions(s) / ED Diagnoses   Final diagnoses:  Panic attack    New Prescriptions Discharge Medication List as of 05/18/2017  9:39 PM       Rolland Porter, MD 05/18/17 2343

## 2017-05-18 NOTE — Telephone Encounter (Signed)
Pt is calling because she wants a prescription of medication for a yeast infection. I tried to make an appointment since she has not been here since October 2017. She just wants the doctor to call this in. jw

## 2017-05-18 NOTE — ED Triage Notes (Signed)
Dizziness with nausea   Onset today   Denies pain

## 2017-06-08 ENCOUNTER — Other Ambulatory Visit: Payer: Self-pay | Admitting: Family Medicine

## 2017-06-08 ENCOUNTER — Other Ambulatory Visit (HOSPITAL_COMMUNITY)
Admission: RE | Admit: 2017-06-08 | Discharge: 2017-06-08 | Disposition: A | Discharge status: Home / Self Care | Payer: Medicaid Other | Source: Ambulatory Visit | Attending: Family Medicine | Admitting: Family Medicine

## 2017-06-08 ENCOUNTER — Encounter: Payer: Self-pay | Admitting: Family Medicine

## 2017-06-08 ENCOUNTER — Ambulatory Visit (INDEPENDENT_AMBULATORY_CARE_PROVIDER_SITE_OTHER): Payer: Medicaid Other | Admitting: Family Medicine

## 2017-06-08 VITALS — BP 110/70 | HR 84 | Temp 98.3°F | Ht 59.0 in | Wt 250.0 lb

## 2017-06-08 DIAGNOSIS — N898 Other specified noninflammatory disorders of vagina: Secondary | ICD-10-CM

## 2017-06-08 DIAGNOSIS — E119 Type 2 diabetes mellitus without complications: Secondary | ICD-10-CM

## 2017-06-08 DIAGNOSIS — R196 Halitosis: Secondary | ICD-10-CM

## 2017-06-08 LAB — POCT WET PREP (WET MOUNT)
CLUE CELLS WET PREP WHIFF POC: NEGATIVE
Trichomonas Wet Prep HPF POC: ABSENT

## 2017-06-08 LAB — POCT GLYCOSYLATED HEMOGLOBIN (HGB A1C): HEMOGLOBIN A1C: 7

## 2017-06-08 MED ORDER — METRONIDAZOLE 500 MG PO TABS: 500.0000 mg | ORAL_TABLET | Freq: Two times a day (BID) | ORAL | 0 refills | Status: AC

## 2017-06-08 NOTE — Assessment & Plan Note (Addendum)
Patient continues to complain of Breath today. I do not appreciate this on exam. Has had evaluation at ENT office in the past. Recommending patient brush and loss after each meal and she can chew gum and/or use mints in the interim.

## 2017-06-08 NOTE — Progress Notes (Signed)
    Subjective:  Sandra [name] is a 26 y.o. female who presents to the [facility] today with a chief complaint of   HPI:  Diabetes mellitus, Type 2 Disease Monitoring Blood Sugar Ranges: Random - low 100s. Polyuria: no Visual problems: no   Urine Microalbumin not checked  Last A1C: 7.0  Medication Compliance: no  Medication Side Effects Hypoglycemia: no   Preventitive Health Care Eye Exam: due for eye exam Foot Exam: will peform today  Discharge: One week or due of clear/white discharge described as fishy smelling.  Does have some itching without dysuria. In committed relationship, does not use protection and not on birth control. Feels confident that SO is committed. Denies abd pain, fevers, chills, nausea, vomiting or diarrhea.    PMH: T2DM Tobacco use: none Medication: reviewed and updated ROS: see HPI   Objective:  Physical Exam: BP 110/70   Pulse 84   Temp 98.3 F (36.8 C) (Oral)   Ht  (1.499 m)   Wt 250 lb (113.4 kg)   LMP 06/01/2017   BMI 50.49 kg/m   Gen: 26yo F in NAD, resting comfortably CV: RRR with no murmurs appreciated Pulm: NWOB, CTAB with no crackles, wheezes, or rhonchi GI: Normal bowel sounds present. Soft, Nontender, Nondistended. GU: Exam performed in the presence of a chaperone. Normal external genitalia. Cervix unremarkable. (exam performed by Dr. Nira Retort) MSK: no edema, cyanosis, or clubbing noted Skin: warm, dry Neuro: grossly normal, moves all extremities Psych: Normal affect and thought content  Results for orders placed or performed in visit on 06/08/17 (from the past 72 hour(s))  POCT glycosylated hemoglobin (Hb A1C)     Status: None   Collection Time: 06/08/17  9:39 AM  Result Value Ref Range   Hemoglobin A1C 7.0    Diabetic Foot Exam - Simple   Simple Foot Form Diabetic Foot exam was performed with the following findings:  Yes 06/08/2017 10:01 AM  Visual  Inspection No deformities, no ulcerations, no other skin breakdown bilaterally:  Yes Sensation Testing Intact to touch and monofilament testing bilaterally:  Yes Pulse Check Posterior Tibialis and Dorsalis pulse intact bilaterally:  Yes Comments      Assessment/Plan:  Diabetes mellitus without complication, without long-term current use of insulin (HCC) Patient taking metformin 500 mg twice a day and reports poor compliance over the past couple of months. Despite this her hemoglobin A1c today was 7.0. Have discussed with patient importance of compliance, keeping up with good diet and exercise. - Follow-up in 6 months for repeat hemoglobin A1c  Vaginal discharge Reports clear/white vaginal discharge for the past 2 weeks also with a fishy odor. Denies any risky sexual behavior and is in committed relationship. Denies any bleeding, fevers, chills or abdominal pain.  - Follow-up wet prep, gonorrhea and chlamydia  Halitosis Patient continues to complain of Breath today. I do not appreciate this on exam. Has had evaluation at ENT office in the past. Recommending patient brush and loss after each meal and she can chew gum and/or use mints in the interim.    Shawan Corella L. Myrtie Soman, MD [facility] Family Medicine Resident PGY-2 06/08/2017 10:38 AM

## 2017-06-08 NOTE — Assessment & Plan Note (Signed)
Reports clear/white vaginal discharge for the past 2 weeks also with a fishy odor. Denies any risky sexual behavior and is in committed relationship. Denies any bleeding, fevers, chills or abdominal pain.  - Follow-up wet prep, gonorrhea and chlamydia

## 2017-06-08 NOTE — Assessment & Plan Note (Signed)
Patient taking metformin 500 mg twice a day and reports poor compliance over the past couple of months. Despite this her hemoglobin A1c today was 7.0. Have discussed with patient importance of compliance, keeping up with good diet and exercise. - Follow-up in 6 months for repeat hemoglobin A1c

## 2017-06-08 NOTE — Patient Instructions (Signed)
Sandra [name], you were seen today for a diabetes follow up.  Despite not taking your medication for some period of time your hemoglobin A1C is 7.0.  This is a great number!  I would highly recommend that you continue taking your medication.   We also did a speculum exam to check for any STDs to work up your discharge.  I will call you and let you know if you have anything.   As for your bad breath, there is really not much I can offer you.  I would recommend brushing your teeth and flossing after every meal and you can consider trying mints, mouthwashes and or gum.   It was very nice seeing you today.  Please follow up in 30mo for another hemoglobin A1C check.   Please keep up with the healthy diet and exercise.   Take care, Tarry Fountain L. Myrtie Soman, MD [facility] Family Medicine Resident PGY-2 06/08/2017 10:30 AM

## 2017-06-09 ENCOUNTER — Other Ambulatory Visit: Payer: Self-pay | Admitting: Family Medicine

## 2017-06-09 DIAGNOSIS — K219 Gastro-esophageal reflux disease without esophagitis: Secondary | ICD-10-CM

## 2017-06-09 DIAGNOSIS — E139 Other specified diabetes mellitus without complications: Secondary | ICD-10-CM

## 2017-06-11 ENCOUNTER — Other Ambulatory Visit: Payer: Self-pay | Admitting: *Deleted

## 2017-06-11 LAB — CERVICOVAGINAL ANCILLARY ONLY
Chlamydia: NEGATIVE
Neisseria Gonorrhea: NEGATIVE

## 2017-06-11 MED ORDER — OMEPRAZOLE 40 MG PO CPDR: 40.0000 mg | DELAYED_RELEASE_CAPSULE | Freq: Every day | ORAL | 3 refills | Status: DC

## 2017-06-11 MED ORDER — GLUCOSE BLOOD VI STRP: ORAL_STRIP | 12 refills | Status: DC

## 2017-08-08 ENCOUNTER — Telehealth: Payer: Self-pay | Admitting: Family Medicine

## 2017-08-08 NOTE — Telephone Encounter (Signed)
Pt called and said she was told by her PCP to call if she needed Rx for her yeast infection. Please advise

## 2017-08-13 ENCOUNTER — Encounter: Payer: Self-pay | Admitting: Student

## 2017-08-13 ENCOUNTER — Other Ambulatory Visit: Payer: Self-pay

## 2017-08-13 ENCOUNTER — Ambulatory Visit (INDEPENDENT_AMBULATORY_CARE_PROVIDER_SITE_OTHER): Payer: Medicaid Other | Admitting: Student

## 2017-08-13 VITALS — BP 132/86 | HR 80 | Temp 98.8°F | Ht 59.0 in | Wt 245.2 lb

## 2017-08-13 DIAGNOSIS — Z3490 Encounter for supervision of normal pregnancy, unspecified, unspecified trimester: Secondary | ICD-10-CM

## 2017-08-13 DIAGNOSIS — Z3481 Encounter for supervision of other normal pregnancy, first trimester: Secondary | ICD-10-CM

## 2017-08-13 LAB — POCT URINE PREGNANCY: PREG TEST UR: POSITIVE — AB

## 2017-08-13 NOTE — Progress Notes (Signed)
  Subjective:    Victorino DikeJennifer is a 26 y.o. old female here missed period  HPI Missed period: LMP 06/23/2017. Home pregnancy test neg on 11/6. Pregnancy is unplanned. She is not on birth control. She also had cramping for two weeks and spotting for one week. No cramping and spotting today. Nausea but no vomiting. Denies fever or chills. Has not been taking folic acid or prenatal vitamins.  Denies smoking cigarettes or recreational drug use. Admits drinking wine occasionally.  She is interested in pregnancy termination. She says "I have no money to raise another child" She says she looked up some places she can call for this.   PMH/Problem List: has Obesity; Halitosis; Implanon in place; Panic attacks; Health care maintenance; Hyperglycemia; Anxiety state; Yeast vaginitis; GERD (gastroesophageal reflux disease); Diabetes mellitus without complication, without long-term current use of insulin (HCC); Vaginal discharge; and Achilles tendon pain on their problem list.   has a past medical history of Anemia, Diabetes mellitus without complication (HCC), GERD (gastroesophageal reflux disease), Hemorrhage after delivery of fetus, History of blood transfusion, and ectopic pregnancy.  FH:  Family History  Problem Relation Age of Onset  . High blood pressure Mother   . Aneurysm Mother   . Hypertension Mother   . Drug abuse Mother   . Diabetes Mellitus II Father   . Hyperlipidemia Father   . Alcohol abuse Father   . Heart disease Maternal Aunt   . Diabetes Paternal Grandmother   . Diabetes Paternal Grandfather   . Other Neg Hx     SH Social History   Tobacco Use  . Smoking status: Former Games developermoker  . Smokeless tobacco: Never Used  Substance Use Topics  . Alcohol use: Yes    Comment: occasionally  . Drug use: No    Review of Systems Review of systems negative except for pertinent positives and negatives in history of present illness above.     Objective:     Vitals:   08/13/17 1659  BP:  132/86  Pulse: 80  Temp: 98.8 F (37.1 C)  TempSrc: Oral  SpO2: 99%  Weight: 245 lb 3.2 oz (111.2 kg)  Height: 4\' 11"  (1.499 m)   Body mass index is 49.52 kg/m.  Physical Exam GEN: appears well, no ditress EYES: PERRL, EOMI THROAT: MMM NECK: Supple RESP:  No IWOB, CTAB CVS:  RRR Abdomen: soft, NT with active BS. No suprapubic mass or tenderness. NEURO: grossly intact PSYCH: normal affect    Assessment and Plan:  1. Unplanned pregnancy: urine pregnancy test positive. She is not interested in keeping pregnancy saying "I have no money to raise another child". She have already started looking up places where she can get help with pregnancy termination. Gave her list of those options with phone number and approximate price. Also recommended quitting drinking wine, and starting taking prenatal vitamin in case she changes her mind. Will continue and run urine culture as well.  Patient had some cramping and spotting which are likely due to pregnancy vs. Ectopy. She has had no cramping or spotting since yesterday. Her abdominal exam is very benign.  Return if symptoms worsen or fail to improve.  Almon Herculesaye T Gonfa, MD 08/13/17 Pager: [phone]

## 2017-08-13 NOTE — Patient Instructions (Signed)
It was great seeing you today! We have addressed the following issues today  Pregnancy: Urine pregnancy test is positive.  We suggest you stop drinking wine if you happen to keep the pregnancy.  If we did any lab work today, and the results require attention, either me or my nurse will get in touch with you. If everything is normal, you will get a letter in mail and a message via . If you don't hear from us in two weeks, please give us a call. Otherwise, we look forward to seeing you again at your next visit. If you have any questions or concerns before then, please call the clinic at [phone].  Please bring all your medications to every doctors visit  Sign up for My Chart to have easy access to your labs results, and communication with your Primary care physician.    Please check-out at the front desk before leaving the clinic.    Take Care,   Dr. Alanda SlimGonfa

## 2017-08-15 LAB — URINE CULTURE, OB REFLEX

## 2017-08-15 LAB — CULTURE, OB URINE

## 2017-08-16 ENCOUNTER — Other Ambulatory Visit: Payer: Self-pay | Admitting: Family Medicine

## 2017-08-16 MED ORDER — FLUCONAZOLE 150 MG PO TABS: 150.0000 mg | ORAL_TABLET | Freq: Once | ORAL | 0 refills | Status: AC

## 2017-08-22 ENCOUNTER — Telehealth: Payer: Self-pay | Admitting: Family Medicine

## 2017-08-22 ENCOUNTER — Encounter: Payer: Self-pay | Admitting: Family Medicine

## 2017-08-22 ENCOUNTER — Other Ambulatory Visit: Payer: Self-pay

## 2017-08-22 ENCOUNTER — Ambulatory Visit: Payer: Medicaid Other | Admitting: Family Medicine

## 2017-08-22 VITALS — BP 110/54 | HR 98 | Temp 98.6°F | Ht 59.0 in | Wt 239.0 lb

## 2017-08-22 DIAGNOSIS — J069 Acute upper respiratory infection, unspecified: Secondary | ICD-10-CM

## 2017-08-22 LAB — INFLUENZA A AND B
INFLUENZA A AG, EIA: NEGATIVE
INFLUENZA B AG, EIA: NEGATIVE

## 2017-08-22 LAB — PLEASE NOTE:

## 2017-08-22 NOTE — Patient Instructions (Signed)
It was a pleasure seeing you today. I believe that you have a viral upper respiratory infection. The question is whether it is caused by the flu or not. We will do a rapid flu test as this will help guide our treatment. This is a send off lab and should come back in around 3 hours. I will call you later this afternoon with the results and let you know if you need to pick up an anti-flu medication.

## 2017-08-22 NOTE — Telephone Encounter (Signed)
Called patient to inform her that her rapid flu test was negative. There is no indication for starting tamiflu at this time. Please see full note to follow.  Myrene BuddyJacob Eilam [place] MD PGY-1 Family Medicine Resident

## 2017-08-26 ENCOUNTER — Emergency Department (HOSPITAL_COMMUNITY)
Admission: EM | Admit: 2017-08-26 | Discharge: 2017-08-26 | Disposition: A | Discharge status: Home / Self Care | Payer: Medicaid Other | Attending: Emergency Medicine | Admitting: Emergency Medicine

## 2017-08-26 ENCOUNTER — Encounter (HOSPITAL_COMMUNITY): Payer: Self-pay | Admitting: Emergency Medicine

## 2017-08-26 ENCOUNTER — Other Ambulatory Visit: Payer: Self-pay

## 2017-08-26 ENCOUNTER — Emergency Department (HOSPITAL_COMMUNITY): Payer: Medicaid Other

## 2017-08-26 DIAGNOSIS — B9689 Other specified bacterial agents as the cause of diseases classified elsewhere: Secondary | ICD-10-CM

## 2017-08-26 DIAGNOSIS — Z79899 Other long term (current) drug therapy: Secondary | ICD-10-CM | POA: Insufficient documentation

## 2017-08-26 DIAGNOSIS — R0981 Nasal congestion: Secondary | ICD-10-CM | POA: Insufficient documentation

## 2017-08-26 DIAGNOSIS — R531 Weakness: Secondary | ICD-10-CM | POA: Diagnosis not present

## 2017-08-26 DIAGNOSIS — O4690 Antepartum hemorrhage, unspecified, unspecified trimester: Secondary | ICD-10-CM | POA: Insufficient documentation

## 2017-08-26 DIAGNOSIS — E119 Type 2 diabetes mellitus without complications: Secondary | ICD-10-CM | POA: Insufficient documentation

## 2017-08-26 DIAGNOSIS — B3731 Acute candidiasis of vulva and vagina: Secondary | ICD-10-CM

## 2017-08-26 DIAGNOSIS — Z87891 Personal history of nicotine dependence: Secondary | ICD-10-CM | POA: Diagnosis not present

## 2017-08-26 DIAGNOSIS — Z7984 Long term (current) use of oral hypoglycemic drugs: Secondary | ICD-10-CM | POA: Diagnosis not present

## 2017-08-26 DIAGNOSIS — O200 Threatened abortion: Secondary | ICD-10-CM | POA: Diagnosis not present

## 2017-08-26 DIAGNOSIS — Z3A09 9 weeks gestation of pregnancy: Secondary | ICD-10-CM | POA: Insufficient documentation

## 2017-08-26 DIAGNOSIS — B373 Candidiasis of vulva and vagina: Secondary | ICD-10-CM | POA: Diagnosis not present

## 2017-08-26 DIAGNOSIS — N760 Acute vaginitis: Secondary | ICD-10-CM | POA: Insufficient documentation

## 2017-08-26 DIAGNOSIS — N939 Abnormal uterine and vaginal bleeding, unspecified: Secondary | ICD-10-CM

## 2017-08-26 LAB — URINALYSIS, ROUTINE W REFLEX MICROSCOPIC
Bilirubin Urine: NEGATIVE
GLUCOSE, UA: 50 mg/dL — AB
Ketones, ur: NEGATIVE mg/dL
Leukocytes, UA: NEGATIVE
Nitrite: NEGATIVE
PROTEIN: NEGATIVE mg/dL
Specific Gravity, Urine: 1.019 (ref 1.005–1.030)
pH: 6 (ref 5.0–8.0)

## 2017-08-26 LAB — BASIC METABOLIC PANEL
ANION GAP: 6 (ref 5–15)
BUN: 8 mg/dL (ref 6–20)
CO2: 27 mmol/L (ref 22–32)
Calcium: 9.3 mg/dL (ref 8.9–10.3)
Chloride: 105 mmol/L (ref 101–111)
Creatinine, Ser: 0.7 mg/dL (ref 0.44–1.00)
GFR calc Af Amer: >60 mL/min (ref >60)
Glucose, Bld: 126 mg/dL — ABNORMAL HIGH (ref 65–99)
POTASSIUM: 3.6 mmol/L (ref 3.5–5.1)
SODIUM: 138 mmol/L (ref 135–145)

## 2017-08-26 LAB — CBC WITH DIFFERENTIAL/PLATELET
BASOS ABS: 0 10*3/uL (ref 0.0–0.1)
BASOS PCT: 0 %
Eosinophils Absolute: 0.2 10*3/uL (ref 0.0–0.7)
Eosinophils Relative: 3 %
HEMATOCRIT: 34.5 % — AB (ref 36.0–46.0)
HEMOGLOBIN: 10.9 g/dL — AB (ref 12.0–15.0)
LYMPHS ABS: 2.3 10*3/uL (ref 0.7–4.0)
LYMPHS PCT: 30 %
MCH: 24.4 pg — ABNORMAL LOW (ref 26.0–34.0)
MCHC: 31.6 g/dL (ref 30.0–36.0)
MCV: 77.4 fL — ABNORMAL LOW (ref 78.0–100.0)
Monocytes Absolute: 0.5 10*3/uL (ref 0.1–1.0)
Monocytes Relative: 7 %
NEUTROS ABS: 4.4 10*3/uL (ref 1.7–7.7)
Neutrophils Relative %: 60 %
Platelets: 268 10*3/uL (ref 150–400)
RBC: 4.46 MIL/uL (ref 3.87–5.11)
RDW: 15.6 % — AB (ref 11.5–15.5)
WBC: 7.4 10*3/uL (ref 4.0–10.5)

## 2017-08-26 LAB — WET PREP, GENITAL
Sperm: NONE SEEN
TRICH WET PREP: NONE SEEN

## 2017-08-26 LAB — POC URINE PREG, ED: PREG TEST UR: POSITIVE — AB

## 2017-08-26 MED ORDER — METRONIDAZOLE 500 MG PO TABS: 500.0000 mg | ORAL_TABLET | Freq: Two times a day (BID) | ORAL | 0 refills | Status: DC

## 2017-08-26 MED ORDER — CLOTRIMAZOLE 1 % VA CREA: 1.0000 | TOPICAL_CREAM | Freq: Every day | VAGINAL | 0 refills | Status: DC

## 2017-08-26 MED ORDER — SODIUM CHLORIDE 0.9 % IV BOLUS (SEPSIS)
1000.0000 mL | Freq: Once | INTRAVENOUS | Status: AC
  Administered 2017-08-26: 1000 mL via INTRAVENOUS

## 2017-08-26 NOTE — ED Triage Notes (Signed)
Pt verbalizes "woke up in a pool of blood." Vaginal bleeding onset throughout the night; denies pain. LMP 06/23/17.

## 2017-08-26 NOTE — ED Notes (Signed)
ED Provider at bedside. 

## 2017-08-26 NOTE — ED Notes (Signed)
US Present for test

## 2017-08-26 NOTE — ED Provider Notes (Signed)
Patient signed out to me by Dr. Ethelda ChickJacubowitz pending her pelvic ultrasound which shows a single live IUP of 8+ weeks.  Patient's wet prep positive for clue cells as well as yeast.  We treated for that as well as given threatened miscarriage precautions   Lorre NickAllen, Jennifr Gaeta, MD 08/26/17 1724

## 2017-08-26 NOTE — ED Provider Notes (Signed)
[facility]-EMERGENCY DEPT Provider Note   CSN: [id number] Arrival date & time: 08/26/17  1219     History   Chief Complaint Chief Complaint  Patient presents with  . Vaginal Bleeding  . Pregnant    HPI Sandra [name] is a 26 y.o. female.  Patient reports she woke up "in a pool of blood" this morning, bleeding from her vagina.  She is presently [redacted] weeks pregnant as told by her family practice physician.  She denies any pain.  She does admit to mild generalized weakness onset an hour ago.  No other symptoms.  No treatment prior to coming here.  HPI  Past Medical History:  Diagnosis Date  . Anemia   . Diabetes mellitus without complication (HCC)   . GERD (gastroesophageal reflux disease)   . Hemorrhage after delivery of fetus    Intraoperative, received blood transfusion  . History of blood transfusion   . Hx of ectopic pregnancy     Patient Active Problem List   Diagnosis Date Noted  . Vaginal discharge 07/07/2016  . Achilles tendon pain 07/07/2016  . Diabetes mellitus without complication, without long-term current use of insulin (HCC) 05/26/2016  . Hyperglycemia 04/28/2016  . Anxiety state 04/28/2016  . Yeast vaginitis 04/28/2016  . GERD (gastroesophageal reflux disease) 04/28/2016  . Obesity 01/13/2014  . Halitosis 01/13/2014  . Implanon in place 01/13/2014  . Panic attacks 01/13/2014  . Health care maintenance 01/13/2014   Past OB/GYN history gravida 4 para Past Surgical History:  Procedure Laterality Date  . CESAREAN SECTION    . CESAREAN SECTION  04/23/2012   Procedure: CESAREAN SECTION;  Surgeon: Reva Boresanya S Pratt, MD;  Location: [facility];  Service: Gynecology;  Laterality: N/A;  . CHOLECYSTECTOMY  10/23/2012   Procedure: LAPAROSCOPIC CHOLECYSTECTOMY WITH INTRAOPERATIVE CHOLANGIOGRAM;  Surgeon: Valarie MerinoMatthew B Martin, MD;  Location: [facility];  Service: General;  Laterality: N/A;  . LAPAROSCOPY FOR ECTOPIC PREGNANCY      OB History    Gravida Para  Term Preterm AB Living   3 2 2   1 2    SAB TAB Ectopic Multiple Live Births       1   2    704 [id number],, this is her fourth pregnancy with 2 terms cesarean sections one therapeutic abortion   Home Medications    Prior to Admission medications   Medication Sig Start Date End Date Taking? Authorizing Provider  glucose blood (ACCU-CHEK GUIDE) test strip Use as instructed 06/11/17   Renne MuscaWarden, Daniel L, MD  metFORMIN (GLUCOPHAGE) 500 MG tablet TAKE 1 TABLET BY MOUTH TWICE A DAY WITH MEALS 06/11/17   Renne MuscaWarden, Daniel L, MD  omeprazole (PRILOSEC) 40 MG capsule Take 1 capsule (40 mg total) by mouth daily. 06/11/17   Renne MuscaWarden, Daniel L, MD  sucralfate (CARAFATE) 1 g tablet Take one tablet crushed 3 times daily with meals and at bedtime. Take omeprazole every morning at least 30 minutes before first dose of Carafate. 10/20/16   Molpus, John, MD    Family History Family History  Problem Relation Age of Onset  . High blood pressure Mother   . Aneurysm Mother   . Hypertension Mother   . Drug abuse Mother   . Diabetes Mellitus II Father   . Hyperlipidemia Father   . Alcohol abuse Father   . Heart disease Maternal Aunt   . Diabetes Paternal Grandmother   . Diabetes Paternal Grandfather   . Other Neg Hx     Social History Social  History   Tobacco Use  . Smoking status: Former Games developer  . Smokeless tobacco: Never Used  Substance Use Topics  . Alcohol use: Yes    Comment: occasionally  . Drug use: No   Current smoker no alcohol no drugs  Allergies   Shellfish allergy   Review of Systems Review of Systems  HENT: Positive for congestion and sneezing.        With "cold symptoms" for the past few days  Respiratory: Positive for cough.   Cardiovascular: Negative.   Gastrointestinal: Negative.   Genitourinary: Positive for vaginal bleeding.       Pregnant  Musculoskeletal: Negative.   Skin: Negative.   Allergic/Immunologic: Positive for immunocompromised state.       Diabetic  Neurological:  Positive for weakness.       Generalized weakness  Psychiatric/Behavioral: Negative.   All other systems reviewed and are negative.    Physical Exam Updated Vital Signs BP 123/63 (BP Location: Left Arm)   Pulse 94   Temp 98.4 F (36.9 C) (Oral)   Resp 18   Ht 4\' 11"  (1.499 m)   Wt 108.4 kg (239 lb)   LMP 06/23/2017   SpO2 100%   BMI 48.27 kg/m   Physical Exam  Constitutional: She appears well-developed and well-nourished.  HENT:  Head: Normocephalic and atraumatic.  Right Ear: External ear normal.  Left Ear: External ear normal.  Mouth/Throat: Oropharynx is clear and moist.  Eyes: Conjunctivae are normal. Pupils are equal, round, and reactive to light.  Neck: Neck supple. No tracheal deviation present. No thyromegaly present.  Cardiovascular: Normal rate and regular rhythm.  No murmur heard. Pulmonary/Chest: Effort normal and breath sounds normal.  Abdominal: Soft. Bowel sounds are normal. She exhibits no distension. There is no tenderness.  Morbidly obese  Genitourinary:  Genitourinary Comments: Pelvic exam no external lesion.  Cervical loss closed.  Slight amount of dark blood in vaginal vault.  No cervical motion tenderness no adnexal masses or tenderness  Musculoskeletal: Normal range of motion. She exhibits no edema or tenderness.  Neurological: She is alert. Coordination normal.  Skin: Skin is warm and dry. No rash noted.  Psychiatric: She has a normal mood and affect.  Nursing note and vitals reviewed.    ED Treatments / Results  Labs (all labs ordered are listed, but only abnormal results are displayed) Labs Reviewed  WET PREP, GENITAL  BASIC METABOLIC PANEL  CBC WITH DIFFERENTIAL/PLATELET  RPR  HIV ANTIBODY (ROUTINE TESTING)  HCG, QUANTITATIVE, PREGNANCY  GC/CHLAMYDIA PROBE AMP ([place]) NOT AT [facility]    EKG  EKG Interpretation None       Radiology No results found.  Procedures Procedures (including critical care time)  Medications  Ordered in ED Medications  sodium chloride 0.9 % bolus 1,000 mL (not administered)    Results for orders placed or performed during the hospital encounter of 08/26/17  Wet prep, genital  Result Value Ref Range   Yeast Wet Prep HPF POC PRESENT (A) NONE SEEN   Trich, Wet Prep NONE SEEN NONE SEEN   Clue Cells Wet Prep HPF POC PRESENT (A) NONE SEEN   WBC, Wet Prep HPF POC MANY (A) NONE SEEN   Sperm NONE SEEN   Basic metabolic panel  Result Value Ref Range   Sodium 138 135 - 145 mmol/L   Potassium 3.6 3.5 - 5.1 mmol/L   Chloride 105 101 - 111 mmol/L   CO2 27 22 - 32 mmol/L   Glucose, Bld 126 (  H) 65 - 99 mg/dL   BUN 8 6 - 20 mg/dL   Creatinine, Ser 1.610.70 0.44 - 1.00 mg/dL   Calcium 9.3 8.9 - 09.610.3 mg/dL   GFR calc non Af Amer >60 >60 mL/min   GFR calc Af Amer >60 >60 mL/min   Anion gap 6 5 - 15  CBC with Differential/Platelet  Result Value Ref Range   WBC 7.4 4.0 - 10.5 K/uL   RBC 4.46 3.87 - 5.11 MIL/uL   Hemoglobin 10.9 (L) 12.0 - 15.0 g/dL   HCT 04.534.5 (L) 40.936.0 - 81.146.0 %   MCV 77.4 (L) 78.0 - 100.0 fL   MCH 24.4 (L) 26.0 - 34.0 pg   MCHC 31.6 30.0 - 36.0 g/dL   RDW 91.415.6 (H) 78.211.5 - 95.615.5 %   Platelets 268 150 - 400 K/uL   Neutrophils Relative % 60 %   Neutro Abs 4.4 1.7 - 7.7 K/uL   Lymphocytes Relative 30 %   Lymphs Abs 2.3 0.7 - 4.0 K/uL   Monocytes Relative 7 %   Monocytes Absolute 0.5 0.1 - 1.0 K/uL   Eosinophils Relative 3 %   Eosinophils Absolute 0.2 0.0 - 0.7 K/uL   Basophils Relative 0 %   Basophils Absolute 0.0 0.0 - 0.1 K/uL  Urinalysis, Routine w reflex microscopic  Result Value Ref Range   Color, Urine YELLOW YELLOW   APPearance CLEAR CLEAR   Specific Gravity, Urine 1.019 1.005 - 1.030   pH 6.0 5.0 - 8.0   Glucose, UA 50 (A) NEGATIVE mg/dL   Hgb urine dipstick LARGE (A) NEGATIVE   Bilirubin Urine NEGATIVE NEGATIVE   Ketones, ur NEGATIVE NEGATIVE mg/dL   Protein, ur NEGATIVE NEGATIVE mg/dL   Nitrite NEGATIVE NEGATIVE   Leukocytes, UA NEGATIVE NEGATIVE    RBC / HPF 6-30 0 - 5 RBC/hpf   WBC, UA 0-5 0 - 5 WBC/hpf   Bacteria, UA RARE (A) NONE SEEN   Squamous Epithelial / LPF 0-5 (A) NONE SEEN   Mucus PRESENT   POC urine preg, ED (not at [facility])  Result Value Ref Range   Preg Test, Ur POSITIVE (A) NEGATIVE   [place] Ob Comp < 14 Wks  Result Date: 08/26/2017 CLINICAL DATA:  Vaginal bleeding EXAM: OBSTETRIC <14 WK US AND TRANSVAGINAL OB US TECHNIQUE: Both transabdominal and transvaginal ultrasound examinations were performed for complete evaluation of the gestation as well as the maternal uterus, adnexal regions, and pelvic cul-de-sac. Transvaginal technique was performed to assess early pregnancy. COMPARISON:  None. FINDINGS: Intrauterine gestational sac: Visualized Yolk sac:  Visualized Embryo:  Visualized Cardiac Activity: Visualized Heart Rate: 175  bpm CRL:  19 mm 8 w   3 d                  US EDC:  April 04, 2018 Subchorionic hemorrhage: There is a 1.2 x 1.1 cm subchorionic hemorrhage. Maternal uterus/adnexae: Cervical os is closed. There is a cyst arising from the right ovary measuring 3.5 x 3.0 x 2.9 cm, a questionable prominent corpus luteum. No other extrauterine pelvic mass. Trace free pelvic fluid noted. IMPRESSION: 1. Single live intrauterine gestation with estimated gestational age of 8+ weeks. 2.  Small subchorionic hemorrhage measuring 1.2 x 1.1 cm. 3. 3.5 x 3.0 x 2.9 cm right ovarian cyst, probably a prominent corpus luteum. 4.  Trace free pelvic fluid, likely physiologic. Electronically Signed   By: Bretta BangWilliam  Woodruff III M.D.   On: 08/26/2017 16:16   [place] Ob Transvaginal  Result  Date: 08/26/2017 CLINICAL DATA:  Vaginal bleeding EXAM: OBSTETRIC <14 WK [place] AND TRANSVAGINAL OB US TECHNIQUE: Both transabdominal and transvaginal ultrasound examinations were performed for complete evaluation of the gestation as well as the maternal uterus, adnexal regions, and pelvic cul-de-sac. Transvaginal technique was performed to assess early pregnancy. COMPARISON:   None. FINDINGS: Intrauterine gestational sac: Visualized Yolk sac:  Visualized Embryo:  Visualized Cardiac Activity: Visualized Heart Rate: 175  bpm CRL:  19 mm 8 w   3 d                  [place] EDC:  April 04, 2018 Subchorionic hemorrhage: There is a 1.2 x 1.1 cm subchorionic hemorrhage. Maternal uterus/adnexae: Cervical os is closed. There is a cyst arising from the right ovary measuring 3.5 x 3.0 x 2.9 cm, a questionable prominent corpus luteum. No other extrauterine pelvic mass. Trace free pelvic fluid noted. IMPRESSION: 1. Single live intrauterine gestation with estimated gestational age of 8+ weeks. 2.  Small subchorionic hemorrhage measuring 1.2 x 1.1 cm. 3. 3.5 x 3.0 x 2.9 cm right ovarian cyst, probably a prominent corpus luteum. 4.  Trace free pelvic fluid, likely physiologic. Electronically Signed   By: Bretta Bang III M.D.   On: 08/26/2017 16:16   Initial Impression / Assessment and Plan / ED Course  I have reviewed the triage vital signs and the nursing notes.  Pertinent labs & imaging results that were available during my care of the patient were reviewed by me and considered in my medical decision making (see chart for details).    Hospital records reviewed.  Patient blood type apositive Counseled patient for 5 minutes on smoking cessation Pt signed out to Dr. Freida Busman 410 pm Final Clinical Impressions(s) / ED Diagnoses  Dx#1 vaginal bleeding while pregnant Final diagnoses:  None   #2  Bacterial vaginosis ED Discharge Orders    None       Doug Sou, MD 08/26/17 1610

## 2017-08-26 NOTE — ED Notes (Signed)
Updated pt on delays, waiting for [place]

## 2017-08-27 LAB — RPR: RPR: NONREACTIVE

## 2017-08-27 LAB — HIV ANTIBODY (ROUTINE TESTING W REFLEX): HIV SCREEN 4TH GENERATION: NONREACTIVE

## 2017-08-28 LAB — GC/CHLAMYDIA PROBE AMP (~~LOC~~) NOT AT ARMC
Chlamydia: NEGATIVE
NEISSERIA GONORRHEA: NEGATIVE

## 2017-09-03 DIAGNOSIS — J069 Acute upper respiratory infection, unspecified: Secondary | ICD-10-CM | POA: Insufficient documentation

## 2017-09-03 NOTE — Assessment & Plan Note (Signed)
Most likely an upper respiratory infection. Suspicion for flu given unvaccinated status but lack of fever makes this diagnosis less likely. Had rapid flu test performed which was negative. Informed patient to continue hydrating and supportive measures. Instruction for return to clinic in 4-5 days if not improved.

## 2017-09-03 NOTE — Progress Notes (Signed)
   HPI Presents with a one day history of cough, sputum production, subjective fever at home, weakness, and muscle pain. She states that these symptoms started "suddenly" at home. She has not taken anything for relief. She states that she has just felt warm at home but has not measured her temperature by formal means.  CC: cough, "feeling bad"   ROS: Review of Systems  Constitutional: Positive for chills and fever (subjective).  HENT: Positive for congestion. Negative for sinus pain.   Respiratory: Positive for cough, sputum production and shortness of breath. Negative for hemoptysis and wheezing.   Cardiovascular: Negative for chest pain, palpitations, orthopnea and claudication.  Gastrointestinal: Negative for constipation, diarrhea, nausea and vomiting.  Musculoskeletal: Positive for myalgias.  Skin: Negative for itching and rash.  Neurological: Positive for weakness. Negative for dizziness.    Review of Systems See HPI for ROS.   CC, SH/smoking status, and VS noted  Objective: BP (!) 110/54   Pulse 98   Temp 98.6 F (37 C) (Oral)   Ht 4\' 11"  (1.499 m)   Wt 239 lb (108.4 kg)   LMP 06/23/2017 (Approximate)   SpO2 97%   BMI 48.27 kg/m  Gen: obese, NAD, alert, cooperative, and pleasant. HEENT: NCAT, EOMI, PERRL CV: RRR, no murmur Resp: CTAB, no wheezes, non-labored, stertorous breath sounds in in upper respiratory system Abd: SNTND, BS present, no guarding or organomegaly Ext: No edema, warm Neuro: Alert and oriented, Speech clear, No gross deficits   Assessment and plan:  Upper respiratory tract infection Most likely an upper respiratory infection. Suspicion for flu given unvaccinated status but lack of fever makes this diagnosis less likely. Had rapid flu test performed which was negative. Informed patient to continue hydrating and supportive measures. Instruction for return to clinic in 4-5 days if not improved.   Orders Placed This Encounter  Procedures  .  Influenza a and b  . Please note:    No orders of the defined types were placed in this encounter.    Myrene BuddyJacob Nasiir Monts MD PGY-1 Family Medicine Resident 09/03/2017 6:36 AM

## 2017-09-22 ENCOUNTER — Emergency Department (HOSPITAL_BASED_OUTPATIENT_CLINIC_OR_DEPARTMENT_OTHER): Payer: Medicaid Other

## 2017-09-22 ENCOUNTER — Encounter (HOSPITAL_BASED_OUTPATIENT_CLINIC_OR_DEPARTMENT_OTHER): Payer: Self-pay | Admitting: Emergency Medicine

## 2017-09-22 ENCOUNTER — Other Ambulatory Visit: Payer: Self-pay

## 2017-09-22 ENCOUNTER — Emergency Department (HOSPITAL_BASED_OUTPATIENT_CLINIC_OR_DEPARTMENT_OTHER)
Admission: EM | Admit: 2017-09-22 | Discharge: 2017-09-22 | Disposition: A | Discharge status: Home / Self Care | Payer: Medicaid Other | Attending: Emergency Medicine | Admitting: Emergency Medicine

## 2017-09-22 DIAGNOSIS — O034 Incomplete spontaneous abortion without complication: Secondary | ICD-10-CM | POA: Insufficient documentation

## 2017-09-22 DIAGNOSIS — O24311 Unspecified pre-existing diabetes mellitus in pregnancy, first trimester: Secondary | ICD-10-CM | POA: Diagnosis not present

## 2017-09-22 DIAGNOSIS — E119 Type 2 diabetes mellitus without complications: Secondary | ICD-10-CM | POA: Insufficient documentation

## 2017-09-22 DIAGNOSIS — O200 Threatened abortion: Secondary | ICD-10-CM | POA: Diagnosis present

## 2017-09-22 DIAGNOSIS — O039 Complete or unspecified spontaneous abortion without complication: Secondary | ICD-10-CM

## 2017-09-22 DIAGNOSIS — Z3A00 Weeks of gestation of pregnancy not specified: Secondary | ICD-10-CM | POA: Insufficient documentation

## 2017-09-22 DIAGNOSIS — Z7984 Long term (current) use of oral hypoglycemic drugs: Secondary | ICD-10-CM | POA: Diagnosis not present

## 2017-09-22 LAB — URINALYSIS, ROUTINE W REFLEX MICROSCOPIC
Bilirubin Urine: NEGATIVE
GLUCOSE, UA: NEGATIVE mg/dL
Ketones, ur: NEGATIVE mg/dL
LEUKOCYTES UA: NEGATIVE
Nitrite: NEGATIVE
PH: 6.5 (ref 5.0–8.0)
PROTEIN: NEGATIVE mg/dL
SPECIFIC GRAVITY, URINE: 1.015 (ref 1.005–1.030)

## 2017-09-22 LAB — URINALYSIS, MICROSCOPIC (REFLEX): WBC UA: NONE SEEN WBC/hpf (ref 0–5)

## 2017-09-22 LAB — HCG, QUANTITATIVE, PREGNANCY: hCG, Beta Chain, Quant, S: 2809 m[IU]/mL — ABNORMAL HIGH (ref <5)

## 2017-09-22 NOTE — Discharge Instructions (Addendum)
You were seen in the Emergency Department where you were found to be having a miscarriage. It will be important for you to call to follow up at [facility] on Monday so that you can be monitored further. Information for [facility] is below.  [facility] Address: [address], [place], [place] [address]  Phone: [phone]  If you develop bleeding that is very strong and not stopping, or if you have dizziness or light headedness, or fevers, then you can follow up at the Maternity Admissions Unit at [facility].

## 2017-09-22 NOTE — ED Triage Notes (Signed)
Patient states that she woke up today with heavier bleeding. The patient states that she is having a miscarrage.

## 2017-09-22 NOTE — ED Notes (Signed)
ED Provider at bedside. 

## 2017-09-22 NOTE — ED Provider Notes (Signed)
[facility] EMERGENCY DEPARTMENT Provider Note   CSN: [id number] Arrival date & time: 09/22/17  0846     History   Chief Complaint Chief Complaint  Patient presents with  . Vaginal Bleeding    HPI Sandra BoerJennifer [name] is a 27 y.o. female [id number] at 9359w0d by December 9 ultrasound who presents with recurrent vaginal bleeding during pregnancy. Patient reports she noted mild spotting yesterday evening, not enough to soak one pad all day. She then woke this morning "in a pool of blood" and she feels she passed a blood clot which she did not save. Abdominal cramping started this morning.   Of note patient had a similar ED visit one month ago at which time IUP was confirmed and she was diagnosed with threatened miscarriage. She has had intermittent mild abdominal cramping since that time but no bleeding until today. Patient endorses plan to terminate pregnancy and [facility].  No fevers. No dysuria, though does endorse urinary urgency and frequency. No abnormal vaginal discharge.     Past Medical History:  Diagnosis Date  . Anemia   . Diabetes mellitus without complication (HCC)   . GERD (gastroesophageal reflux disease)   . Hemorrhage after delivery of fetus    Intraoperative, received blood transfusion  . History of blood transfusion   . Hx of ectopic pregnancy     Patient Active Problem List   Diagnosis Date Noted  . Upper respiratory tract infection 09/03/2017  . Vaginal discharge 07/07/2016  . Achilles tendon pain 07/07/2016  . Diabetes mellitus without complication, without long-term current use of insulin (HCC) 05/26/2016  . Hyperglycemia 04/28/2016  . Anxiety state 04/28/2016  . Yeast vaginitis 04/28/2016  . GERD (gastroesophageal reflux disease) 04/28/2016  . Obesity 01/13/2014  . Halitosis 01/13/2014  . Implanon in place 01/13/2014  . Panic attacks 01/13/2014  . Health care maintenance 01/13/2014    Past Surgical  History:  Procedure Laterality Date  . CESAREAN SECTION    . CESAREAN SECTION  04/23/2012   Procedure: CESAREAN SECTION;  Surgeon: Reva Boresanya S Pratt, MD;  Location: [facility] ORS;  Service: Gynecology;  Laterality: N/A;  . CHOLECYSTECTOMY  10/23/2012   Procedure: LAPAROSCOPIC CHOLECYSTECTOMY WITH INTRAOPERATIVE CHOLANGIOGRAM;  Surgeon: Valarie MerinoMatthew B Martin, MD;  Location: [facility] ORS;  Service: General;  Laterality: N/A;  . LAPAROSCOPY FOR ECTOPIC PREGNANCY      OB History    Gravida Para Term Preterm AB Living   4 2 2   1 2    SAB TAB Ectopic Multiple Live Births       1   2       Home Medications    Prior to Admission medications   Medication Sig Start Date End Date Taking? Authorizing Provider  clotrimazole (GYNE-LOTRIMIN) 1 % vaginal cream Place 1 Applicatorful vaginally at bedtime. Use for 7 days 08/26/17   Lorre NickAllen, Anthony, MD  cyanocobalamin 500 MCG tablet Take 500 mcg by mouth daily.    [provider]  cycloSPORINE (RESTASIS MULTIDOSE) 0.05 % ophthalmic emulsion Place 1 drop into both eyes 2 (two) times daily. 06/21/16   [provider]  glucose blood (ACCU-CHEK GUIDE) test strip Use as instructed 06/11/17   Renne MuscaWarden, Daniel L, MD  guaiFENesin (MUCINEX) 600 MG 12 hr tablet Take 600 mg by mouth 2 (two) times daily as needed for cough.    [provider]  ibuprofen (ADVIL,MOTRIN) 200 MG tablet Take 800 mg by mouth every 6 (six) hours as needed  for mild pain.    [provider]  metFORMIN (GLUCOPHAGE) 500 MG tablet TAKE 1 TABLET BY MOUTH TWICE A DAY WITH MEALS 06/11/17   Renne Musca, MD  metroNIDAZOLE (FLAGYL) 500 MG tablet Take 1 tablet (500 mg total) by mouth 2 (two) times daily. 08/26/17   Lorre Nick, MD  Multiple Vitamin (MULTIVITAMIN WITH MINERALS) TABS tablet Take 1 tablet by mouth daily.    [provider]  omeprazole (PRILOSEC) 40 MG capsule Take 1 capsule (40 mg total) by mouth daily. 06/11/17   Renne Musca, MD  saccharomyces boulardii  (FLORASTOR) 250 MG capsule Take 250 mg by mouth daily.    [provider]  sucralfate (CARAFATE) 1 g tablet Take one tablet crushed 3 times daily with meals and at bedtime. Take omeprazole every morning at least 30 minutes before first dose of Carafate. Patient not taking: Reported on 08/26/2017 10/20/16   Molpus, Jonny Ruiz, MD    Family History Family History  Problem Relation Age of Onset  . High blood pressure Mother   . Aneurysm Mother   . Hypertension Mother   . Drug abuse Mother   . Diabetes Mellitus II Father   . Hyperlipidemia Father   . Alcohol abuse Father   . Heart disease Maternal Aunt   . Diabetes Paternal Grandmother   . Diabetes Paternal Grandfather   . Other Neg Hx     Social History Social History   Tobacco Use  . Smoking status: Former Games developer  . Smokeless tobacco: Never Used  Substance Use Topics  . Alcohol use: Yes    Comment: occasionally  . Drug use: No     Allergies   Shellfish allergy   Review of Systems Review of Systems  Constitutional: Negative for chills and fever.  HENT: Negative for congestion, rhinorrhea and sore throat.   Respiratory: Negative for apnea and shortness of breath.   Cardiovascular: Negative for chest pain.  Gastrointestinal: Positive for abdominal pain. Negative for abdominal distention, constipation, diarrhea, nausea and vomiting.  Genitourinary: Positive for frequency, urgency and vaginal bleeding. Negative for dysuria, vaginal discharge and vaginal pain.     Physical Exam Updated Vital Signs BP 135/74 (BP Location: Right Arm)   Pulse 78   Temp 98.6 F (37 C) (Oral)   Resp 16   Ht 4\' 11"  (1.499 m)   Wt 108.4 kg (239 lb)   LMP 06/24/2017   SpO2 100%   BMI 48.27 kg/m   Physical Exam  Constitutional: She is oriented to person, place, and time. She appears well-developed and well-nourished. No distress.  Cardiovascular: Regular rhythm.  Pulmonary/Chest: Effort normal.  Abdominal: Soft. Bowel sounds are  normal.  Neurological: She is alert and oriented to person, place, and time.  Skin: Skin is warm and dry.    ED Treatments / Results  Labs (all labs ordered are listed, but only abnormal results are displayed) Labs Reviewed  URINALYSIS, ROUTINE W REFLEX MICROSCOPIC - Abnormal; Notable for the following components:      Result Value   Hgb urine dipstick LARGE (*)    All other components within normal limits  URINALYSIS, MICROSCOPIC (REFLEX) - Abnormal; Notable for the following components:   Bacteria, UA FEW (*)    Squamous Epithelial / LPF 0-5 (*)    All other components within normal limits  HCG, QUANTITATIVE, PREGNANCY    EKG  EKG Interpretation None       Radiology  US Ob Comp < 14 Wks  Result Date:  09/22/2017 CLINICAL DATA:  Pregnant patient with vaginal bleeding. EXAM: OBSTETRIC <14 WK [place] AND TRANSVAGINAL OB US TECHNIQUE: Both transabdominal and transvaginal ultrasound examinations were performed for complete evaluation of the gestation as well as the maternal uterus, adnexal regions, and pelvic cul-de-sac. Transvaginal technique was performed to assess early pregnancy. COMPARISON:  Pelvic ultrasound 08/26/2017 FINDINGS: Intrauterine gestational sac: Single Yolk sac:  Not Visualized. Embryo:  Visualized. Cardiac Activity: Not Visualized. Heart Rate: 0  bpm CRL:  20  mm   8 w   4 d                  [place] EDC: 04/30/2018 Subchorionic hemorrhage:  None visualized. Maternal uterus/adnexae: The ovaries are not well visualized. Trace free fluid in the pelvis. IMPRESSION: Fetal pole with crown-rump length of 20 mm and no heartbeat identified. Findings meet definitive criteria for failed pregnancy. This follows SRU consensus guidelines: Diagnostic Criteria for Nonviable Pregnancy Early in the First Trimester. [facility] Mis J Med [phone]. Electronically Signed   By: Annia Belt M.D.   On: 09/22/2017 10:37   US Ob Transvaginal  Result Date: 09/22/2017 CLINICAL DATA:  Pregnant patient with  vaginal bleeding. EXAM: OBSTETRIC <14 WK [place] AND TRANSVAGINAL OB US TECHNIQUE: Both transabdominal and transvaginal ultrasound examinations were performed for complete evaluation of the gestation as well as the maternal uterus, adnexal regions, and pelvic cul-de-sac. Transvaginal technique was performed to assess early pregnancy. COMPARISON:  Pelvic ultrasound 08/26/2017 FINDINGS: Intrauterine gestational sac: Single Yolk sac:  Not Visualized. Embryo:  Visualized. Cardiac Activity: Not Visualized. Heart Rate: 0  bpm CRL:  20  mm   8 w   4 d                  [place] EDC: 04/30/2018 Subchorionic hemorrhage:  None visualized. Maternal uterus/adnexae: The ovaries are not well visualized. Trace free fluid in the pelvis. IMPRESSION: Fetal pole with crown-rump length of 20 mm and no heartbeat identified. Findings meet definitive criteria for failed pregnancy. This follows SRU consensus guidelines: Diagnostic Criteria for Nonviable Pregnancy Early in the First Trimester. [facility] Mis J Med [phone]. Electronically Signed   By: Annia Belt M.D.   On: 09/22/2017 10:37    Procedures Procedures (including critical care time)  Medications Ordered in ED Medications - No data to display   Initial Impression / Assessment and Plan / ED Course  I have reviewed the triage vital signs and the nursing notes.  Pertinent labs & imaging results that were available during my care of the patient were reviewed by me and considered in my medical decision making (see chart for details).    27 year old [id number] at [redacted]w[redacted]d by early ultrasound presents with pooling vaginal bleeding and cramping that started this morning, with bedside ultrasound concerning for inevitable abortion. Formal transvaginal ultrasound confirmed this diagnosis. bHcg was collected, and patient was advised to call [facility] to follow up on Monday so that bHcg may be trended.  Return precautions including excessive bleeding, palpitations or dizziness, and  fevers were provided. Patient was planning on terminating the pregnancy and she endorsed mixed feelings but was not too upset about the miscarriage at this time.  For her urinary frequency and urgency a UA was tested and there were no signs of infection.  Final Clinical Impressions(s) / ED Diagnoses   Final diagnoses:  Inevitable abortion   ED Discharge Orders    None       Howard Pouch, MD 09/22/17 1156  Gwyneth Sprout, MD 09/22/17 2126

## 2017-09-22 NOTE — ED Notes (Signed)
Patient transported to X-ray 

## 2017-09-25 ENCOUNTER — Other Ambulatory Visit: Payer: Self-pay

## 2017-09-26 ENCOUNTER — Other Ambulatory Visit: Payer: Medicaid Other

## 2017-09-26 DIAGNOSIS — O039 Complete or unspecified spontaneous abortion without complication: Secondary | ICD-10-CM

## 2017-09-27 LAB — CBC
HEMATOCRIT: 35.9 % (ref 34.0–46.6)
HEMOGLOBIN: 11 g/dL — AB (ref 11.1–15.9)
MCH: 24.4 pg — ABNORMAL LOW (ref 26.6–33.0)
MCHC: 30.6 g/dL — ABNORMAL LOW (ref 31.5–35.7)
MCV: 80 fL (ref 79–97)
Platelets: 274 10*3/uL (ref 150–379)
RBC: 4.5 x10E6/uL (ref 3.77–5.28)
RDW: 15.7 % — ABNORMAL HIGH (ref 12.3–15.4)
WBC: 5.7 10*3/uL (ref 3.4–10.8)

## 2017-09-27 LAB — BETA HCG QUANT (REF LAB): hCG Quant: 770 m[IU]/mL

## 2017-10-01 ENCOUNTER — Telehealth: Payer: Self-pay | Admitting: General Practice

## 2017-10-01 NOTE — Telephone Encounter (Signed)
Called and notified patient of lab visit and follow up SAB appointment.  Patient voiced understanding.

## 2017-10-04 ENCOUNTER — Other Ambulatory Visit: Payer: Self-pay | Admitting: *Deleted

## 2017-10-04 DIAGNOSIS — O039 Complete or unspecified spontaneous abortion without complication: Secondary | ICD-10-CM

## 2017-10-05 ENCOUNTER — Other Ambulatory Visit: Payer: Medicaid Other

## 2017-10-05 DIAGNOSIS — O039 Complete or unspecified spontaneous abortion without complication: Secondary | ICD-10-CM

## 2017-10-06 LAB — BETA HCG QUANT (REF LAB): hCG Quant: 157 m[IU]/mL

## 2017-10-15 ENCOUNTER — Encounter: Payer: Self-pay | Admitting: Advanced Practice Midwife

## 2017-10-15 ENCOUNTER — Ambulatory Visit: Payer: Medicaid Other | Admitting: Advanced Practice Midwife

## 2017-10-15 VITALS — BP 130/68 | HR 80

## 2017-10-15 DIAGNOSIS — O039 Complete or unspecified spontaneous abortion without complication: Secondary | ICD-10-CM | POA: Diagnosis not present

## 2017-10-15 NOTE — Patient Instructions (Signed)
Etonogestrel implant What is this medicine? ETONOGESTREL (et oh noe JES trel) is a contraceptive (birth control) device. It is used to prevent pregnancy. It can be used for up to 3 years. This medicine may be used for other purposes; ask your health care provider or pharmacist if you have questions. COMMON BRAND NAME(S): Implanon, Nexplanon What should I tell my health care provider before I take this medicine? They need to know if you have any of these conditions: -abnormal vaginal bleeding -blood vessel disease or blood clots -cancer of the breast, cervix, or liver -depression -diabetes -gallbladder disease -headaches -heart disease or recent heart attack -high blood pressure -high cholesterol -kidney disease -liver disease -renal disease -seizures -tobacco smoker -an unusual or allergic reaction to etonogestrel, other hormones, anesthetics or antiseptics, medicines, foods, dyes, or preservatives -pregnant or trying to get pregnant -breast-feeding How should I use this medicine? This device is inserted just under the skin on the inner side of your upper arm by a health care professional. Talk to your pediatrician regarding the use of this medicine in children. Special care may be needed. Overdosage: If you think you have taken too much of this medicine contact a poison control center or emergency room at once. NOTE: This medicine is only for you. Do not share this medicine with others. What if I miss a dose? This does not apply. What may interact with this medicine? Do not take this medicine with any of the following medications: -amprenavir -bosentan -fosamprenavir This medicine may also interact with the following medications: -barbiturate medicines for inducing sleep or treating seizures -certain medicines for fungal infections like ketoconazole and itraconazole -grapefruit juice -griseofulvin -medicines to treat seizures like carbamazepine, felbamate, oxcarbazepine,  phenytoin, topiramate -modafinil -phenylbutazone -rifampin -rufinamide -some medicines to treat HIV infection like atazanavir, indinavir, lopinavir, nelfinavir, tipranavir, ritonavir -St. John's wort This list may not describe all possible interactions. Give your health care provider a list of all the medicines, herbs, non-prescription drugs, or dietary supplements you use. Also tell them if you smoke, drink alcohol, or use illegal drugs. Some items may interact with your medicine. What should I watch for while using this medicine? This product does not protect you against HIV infection (AIDS) or other sexually transmitted diseases. You should be able to feel the implant by pressing your fingertips over the skin where it was inserted. Contact your doctor if you cannot feel the implant, and use a non-hormonal birth control method (such as condoms) until your doctor confirms that the implant is in place. If you feel that the implant may have broken or become bent while in your arm, contact your healthcare provider. What side effects may I notice from receiving this medicine? Side effects that you should report to your doctor or health care professional as soon as possible: -allergic reactions like skin rash, itching or hives, swelling of the face, lips, or tongue -breast lumps -changes in emotions or moods -depressed mood -heavy or prolonged menstrual bleeding -pain, irritation, swelling, or bruising at the insertion site -scar at site of insertion -signs of infection at the insertion site such as fever, and skin redness, pain or discharge -signs of pregnancy -signs and symptoms of a blood clot such as breathing problems; changes in vision; chest pain; severe, sudden headache; pain, swelling, warmth in the leg; trouble speaking; sudden numbness or weakness of the face, arm or leg -signs and symptoms of liver injury like dark yellow or brown urine; general ill feeling or flu-like symptoms;  light-colored   stools; loss of appetite; nausea; right upper belly pain; unusually weak or tired; yellowing of the eyes or skin -unusual vaginal bleeding, discharge -signs and symptoms of a stroke like changes in vision; confusion; trouble speaking or understanding; severe headaches; sudden numbness or weakness of the face, arm or leg; trouble walking; dizziness; loss of balance or coordination Side effects that usually do not require medical attention (report to your doctor or health care professional if they continue or are bothersome): -acne -back pain -breast pain -changes in weight -dizziness -general ill feeling or flu-like symptoms -headache -irregular menstrual bleeding -nausea -sore throat -vaginal irritation or inflammation This list may not describe all possible side effects. Call your doctor for medical advice about side effects. You may report side effects to FDA at 1-800-FDA-1088. Where should I keep my medicine? This drug is given in a hospital or clinic and will not be stored at home. NOTE: This sheet is a summary. It may not cover all possible information. If you have questions about this medicine, talk to your doctor, pharmacist, or health care provider.  2018 Elsevier/Gold Standard (2016-03-23 11:19:22)  

## 2017-10-15 NOTE — Progress Notes (Signed)
Subjective:     Patient ID: Sandra [name], female   DOB: 1991-05-12, 27 y.o.   MRN: [id number]  Sandra [name] is a 27 y.o. [id number] who is S/P SAB on 09/22/17. She has no concenrs today. She reports minimal cramping, and brown spotting only at this time. HCG have been consisently trending down.  Results for Sandra [name], [name] (MRN [id number]) as of 10/15/2017 16:42  09/22/2017 11:00 HCG, Beta Nyra Jabshain, Quant, S: 2,809 (H)  Results for Sandra [name], [name] (MRN [id number]) as of 10/15/2017 16:42  09/26/2017 11:04 hCG Quant: 770   Results for Sandra [name], [name] (MRN [id number]) as of 10/15/2017 16:42  10/05/2017 10:28 hCG Quant: 157      Review of Systems  All other systems reviewed and are negative.      Objective:   Physical Exam  Constitutional: She is oriented to person, place, and time. She appears well-developed and well-nourished. No distress.  HENT:  Head: Normocephalic.  Cardiovascular: Normal rate.  Pulmonary/Chest: Effort normal.  Abdominal: Soft.  Musculoskeletal: Normal range of motion.  Neurological: She is alert and oriented to person, place, and time.  Skin: Skin is warm and dry.  Nursing note and vitals reviewed.      Assessment:     1. Miscarriage        Plan:     Education and emotional support provided to the patient today All questions answered She would like to use Nexplanon for birth control. She has used this in the past with succes Will schedule to return another day for insertion CBC, HCG today Will message patient with results.   Thressa ShellerHeather Hogan 4:44 PM 10/15/17

## 2017-10-16 LAB — CBC
HEMOGLOBIN: 10.8 g/dL — AB (ref 11.1–15.9)
Hematocrit: 35.4 % (ref 34.0–46.6)
MCH: 23.7 pg — AB (ref 26.6–33.0)
MCHC: 30.5 g/dL — ABNORMAL LOW (ref 31.5–35.7)
MCV: 78 fL — AB (ref 79–97)
Platelets: 278 10*3/uL (ref 150–379)
RBC: 4.55 x10E6/uL (ref 3.77–5.28)
RDW: 15.8 % — ABNORMAL HIGH (ref 12.3–15.4)
WBC: 6.2 10*3/uL (ref 3.4–10.8)

## 2017-10-16 LAB — BETA HCG QUANT (REF LAB): HCG QUANT: 48 m[IU]/mL

## 2017-10-30 ENCOUNTER — Other Ambulatory Visit: Payer: Self-pay

## 2017-10-31 ENCOUNTER — Other Ambulatory Visit: Payer: Self-pay | Admitting: *Deleted

## 2017-10-31 ENCOUNTER — Other Ambulatory Visit: Payer: Self-pay

## 2017-10-31 DIAGNOSIS — O039 Complete or unspecified spontaneous abortion without complication: Secondary | ICD-10-CM

## 2018-02-25 ENCOUNTER — Ambulatory Visit: Payer: Self-pay | Admitting: Advanced Practice Midwife

## 2018-03-11 ENCOUNTER — Other Ambulatory Visit: Payer: Self-pay

## 2018-03-11 ENCOUNTER — Encounter: Payer: Self-pay | Admitting: Advanced Practice Midwife

## 2018-03-11 ENCOUNTER — Ambulatory Visit (INDEPENDENT_AMBULATORY_CARE_PROVIDER_SITE_OTHER): Payer: Medicaid Other | Admitting: Advanced Practice Midwife

## 2018-03-11 VITALS — BP 147/73 | HR 94 | Wt 271.0 lb

## 2018-03-11 DIAGNOSIS — Z3009 Encounter for other general counseling and advice on contraception: Secondary | ICD-10-CM

## 2018-03-11 DIAGNOSIS — Z3046 Encounter for surveillance of implantable subdermal contraceptive: Secondary | ICD-10-CM

## 2018-03-11 DIAGNOSIS — Z30017 Encounter for initial prescription of implantable subdermal contraceptive: Secondary | ICD-10-CM

## 2018-03-11 LAB — POCT PREGNANCY, URINE: PREG TEST UR: NEGATIVE

## 2018-03-11 MED ORDER — ETONOGESTREL 68 MG ~~LOC~~ IMPL
68.0000 mg | DRUG_IMPLANT | Freq: Once | SUBCUTANEOUS | Status: AC
  Administered 2018-03-11: 68 mg via SUBCUTANEOUS

## 2018-03-11 NOTE — Progress Notes (Signed)
GYNECOLOGY OFFICE PROCEDURE NOTE  Sandra [name] is a 27 y.o. [phone] here for  Nexplanon insertion.  Last pap smear was on 04/28/2016 and was normal.  No other gynecologic concerns. LMP 03/04/18, UPT negative today   Nexplanon Insertion Procedure Patient identified, informed consent performed, consent signed.   Patient does understand that irregular bleeding is a very common side effect of this medication. She was advised to have backup contraception for one week after placement. Pregnancy test in clinic today was negative.  Appropriate time out taken.  Patient's left arm was prepped and draped in the usual sterile fashion. The ruler used to measure and mark insertion area.  Patient was prepped with alcohol swab and then injected with 3 ml of 1% lidocaine.  She was prepped with chlora-prep, Nexplanon removed from packaging,  Device confirmed in needle, then inserted full length of needle and withdrawn per handbook instructions. Nexplanon was able to palpated in the patient's arm; patient palpated the insert herself. There was minimal blood loss.  Patient insertion site covered with guaze and a pressure bandage to reduce any bruising.  The patient tolerated the procedure well and was given post procedure instructions.     Thressa ShellerHeather Hogan 10:45 AM 03/11/18

## 2018-03-11 NOTE — Patient Instructions (Signed)
Nexplanon Instructions After Insertion  Keep bandage clean and dry for 24 hours  May use ice/Tylenol/Ibuprofen for soreness or pain  If you develop fever, drainage or increased warmth from incision site-contact office immediately   

## 2018-04-30 ENCOUNTER — Other Ambulatory Visit: Payer: Self-pay

## 2018-04-30 NOTE — Telephone Encounter (Signed)
Fax from [facility], [address], [address] requesting Fluconazole 150 MG tabs. I did not see this on the pt med list. Please advise. Sunday SpillersSharon T Athan Casalino, CMA

## 2018-05-01 NOTE — Telephone Encounter (Signed)
I haven't been able to meet this patient yet. Unsure if she has recurrent yeast infections, does not appear this way from chart. Please have her get a sooner appointment for evaluation - ATC may be a good option. She needs to be seen and assessed before treatment for possible yeast.

## 2018-05-02 NOTE — Telephone Encounter (Signed)
Contacted pt and scheduled in ATC on Monday for this. Sandra [name], April D, [place]

## 2018-05-06 ENCOUNTER — Ambulatory Visit: Payer: Self-pay

## 2018-05-22 ENCOUNTER — Ambulatory Visit: Payer: Self-pay | Admitting: Family Medicine

## 2018-05-23 ENCOUNTER — Emergency Department (HOSPITAL_BASED_OUTPATIENT_CLINIC_OR_DEPARTMENT_OTHER)
Admission: EM | Admit: 2018-05-23 | Discharge: 2018-05-23 | Disposition: A | Discharge status: Home / Self Care | Payer: Medicaid Other | Attending: Emergency Medicine | Admitting: Emergency Medicine

## 2018-05-23 ENCOUNTER — Encounter (HOSPITAL_BASED_OUTPATIENT_CLINIC_OR_DEPARTMENT_OTHER): Payer: Self-pay | Admitting: Emergency Medicine

## 2018-05-23 ENCOUNTER — Other Ambulatory Visit: Payer: Self-pay

## 2018-05-23 ENCOUNTER — Telehealth: Payer: Self-pay

## 2018-05-23 DIAGNOSIS — R21 Rash and other nonspecific skin eruption: Secondary | ICD-10-CM

## 2018-05-23 DIAGNOSIS — Z7984 Long term (current) use of oral hypoglycemic drugs: Secondary | ICD-10-CM | POA: Diagnosis not present

## 2018-05-23 DIAGNOSIS — Z87891 Personal history of nicotine dependence: Secondary | ICD-10-CM | POA: Diagnosis not present

## 2018-05-23 DIAGNOSIS — E119 Type 2 diabetes mellitus without complications: Secondary | ICD-10-CM | POA: Diagnosis not present

## 2018-05-23 DIAGNOSIS — Z79899 Other long term (current) drug therapy: Secondary | ICD-10-CM | POA: Insufficient documentation

## 2018-05-23 NOTE — ED Triage Notes (Addendum)
Pt c/o burning and itching and rash on hands x 2 days. Pt states small blisters appeared this am.

## 2018-05-23 NOTE — Discharge Instructions (Addendum)
You can use cortisone cream, available over the counter for itching.  Start using latex free gloves.

## 2018-05-23 NOTE — Telephone Encounter (Signed)
Fax from pharmacy for refill of Fluconazole. Not on current med list.  Ples Specter, RN [facility], [facility] RN)

## 2018-05-23 NOTE — ED Provider Notes (Signed)
[facility] EMERGENCY DEPARTMENT Provider Note   CSN: [id number] Arrival date & time: 05/23/18  0759     History   Chief Complaint Chief Complaint  Patient presents with  . Rash    HPI Sandra [name] is a 27 y.o. female.  The history is provided by the patient. No language interpreter was used.  Rash     Sandra [name] is a 27 y.o. female who presents to the Emergency Department complaining of rash. She presents to the emergency department complaining of itchy rash to bilateral hands. 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 Yesterday she noted itchy and burning bumps to bilateral dorsal hands. Symptoms are moderate and constant in nature. No prior similar symptoms. She denies any generalized rash, difficulty breathing, nausea, vomiting. Past Medical History:  Diagnosis Date  . Anemia   . Diabetes mellitus without complication (HCC)   . GERD (gastroesophageal reflux disease)   . Hemorrhage after delivery of fetus    Intraoperative, received blood transfusion  . History of blood transfusion   . Hx of ectopic pregnancy     Patient Active Problem List   Diagnosis Date Noted  . Upper respiratory tract infection 09/03/2017  . Vaginal discharge 07/07/2016  . Achilles tendon pain 07/07/2016  . Diabetes mellitus without complication, without long-term current use of insulin (HCC) 05/26/2016  . Hyperglycemia 04/28/2016  . Anxiety state 04/28/2016  . Yeast vaginitis 04/28/2016  . GERD (gastroesophageal reflux disease) 04/28/2016  . Obesity 01/13/2014  . Halitosis 01/13/2014  . Implanon in place 01/13/2014  . Panic attacks 01/13/2014  . Health care maintenance 01/13/2014    Past Surgical History:  Procedure Laterality Date  . CESAREAN SECTION    . CESAREAN SECTION  04/23/2012   Procedure: CESAREAN SECTION;  Surgeon: Reva Bores, MD;  Location: [facility];  Service: Gynecology;  Laterality: N/A;  . CHOLECYSTECTOMY   10/23/2012   Procedure: LAPAROSCOPIC CHOLECYSTECTOMY WITH INTRAOPERATIVE CHOLANGIOGRAM;  Surgeon: Valarie Merino, MD;  Location: [facility];  Service: General;  Laterality: N/A;  . LAPAROSCOPY FOR ECTOPIC PREGNANCY       OB History    Gravida  4   Para  2   Term  2   Preterm      AB  1   Living  2     SAB      TAB      Ectopic  1   Multiple      Live Births  2            Home Medications    Prior to Admission medications   Medication Sig Start Date End Date Taking? Authorizing Provider  cyanocobalamin 500 MCG tablet Take 500 mcg by mouth daily.    [provider]  cycloSPORINE (RESTASIS MULTIDOSE) 0.05 % ophthalmic emulsion Place 1 drop into both eyes 2 (two) times daily. 06/21/16   [provider]  glucose blood (ACCU-CHEK GUIDE) test strip Use as instructed 06/11/17   Renne Musca, MD  metFORMIN (GLUCOPHAGE) 500 MG tablet TAKE 1 TABLET BY MOUTH TWICE A DAY WITH MEALS 06/11/17   Renne Musca, MD  Multiple Vitamin (MULTIVITAMIN WITH MINERALS) TABS tablet Take 1 tablet by mouth daily.    [provider]  omeprazole (PRILOSEC) 40 MG capsule Take 1 capsule (40 mg total) by mouth daily. Patient not taking: Reported on 10/15/2017 06/11/17   Renne Musca, MD    Family History  Family History  Problem Relation Age of Onset  . High blood pressure Mother   . Aneurysm Mother   . Hypertension Mother   . Drug abuse Mother   . Diabetes Mellitus II Father   . Hyperlipidemia Father   . Alcohol abuse Father   . Heart disease Maternal Aunt   . Diabetes Paternal Grandmother   . Diabetes Paternal Grandfather   . Other Neg Hx     Social History Social History   Tobacco Use  . Smoking status: Former Games developer  . Smokeless tobacco: Never Used  Substance Use Topics  . Alcohol use: Yes    Comment: occasionally  . Drug use: No     Allergies   Shellfish allergy   Review of Systems Review of Systems  Skin: Positive for rash.  All  other systems reviewed and are negative.    Physical Exam Updated Vital Signs BP (!) 136/91 (BP Location: Right Arm)   Pulse 82   Temp 98.4 F (36.9 C) (Oral)   Resp 18   Ht 4\' 11"  (1.499 m)   Wt 111.1 kg   LMP 03/18/2018   SpO2 100%   BMI 49.48 kg/m   Physical Exam  Constitutional: She is oriented to person, place, and time. She appears well-developed and well-nourished. No distress.  HENT:  Head: Normocephalic and atraumatic.  Neck: Neck supple.  Cardiovascular: Normal rate and regular rhythm.  Pulmonary/Chest: Effort normal. No respiratory distress.  Musculoskeletal: Normal range of motion.  Scattered faint papules on the dorsal right ring finger. Range of motion intact throughout the hands bilaterally. No edema to the hands. 2+ radial pulses.  Neurological: She is alert and oriented to person, place, and time.  Skin: Skin is warm and dry. Capillary refill takes less than 2 seconds.  Psychiatric: She has a normal mood and affect. Her behavior is normal.  Nursing note and vitals reviewed.    ED Treatments / Results  Labs (all labs ordered are listed, but only abnormal results are displayed) Labs Reviewed - No data to display  EKG None  Radiology No results found.  Procedures Procedures (including critical care time)  Medications Ordered in ED Medications - No data to display   Initial Impression / Assessment and Plan / ED Course  I have reviewed the triage vital signs and the nursing notes.  Pertinent labs & imaging results that were available during my care of the patient were reviewed by me and considered in my medical decision making (see chart for details).     Patient here for evaluation of itchy rash to bilateral hands after using new gloves at a new job. She is non-toxic appearing on examination. There are a few fine papules on her hands. Exam is not consistent with acute bacterial infection, zoster, scabies. Discussed with patient home care for  irritant dermatitis with changing her gloves. She may try over-the-counter cortisone cream if needed. Return precautions discussed.  Final Clinical Impressions(s) / ED Diagnoses   Final diagnoses:  Rash    ED Discharge Orders    None       Tilden Fossa, MD 05/23/18 [phone]

## 2018-05-23 NOTE — Telephone Encounter (Signed)
Please call patient. I cannot refill this via phone. If she is concerned she has a yeast infection or vaginal discharge she should be seen or can try OTC yeast products in the meantime.

## 2018-05-26 ENCOUNTER — Encounter (HOSPITAL_COMMUNITY): Payer: Self-pay | Admitting: Emergency Medicine

## 2018-05-26 ENCOUNTER — Ambulatory Visit (HOSPITAL_COMMUNITY)
Admission: EM | Admit: 2018-05-26 | Discharge: 2018-05-26 | Disposition: A | Discharge status: Home / Self Care | Payer: Medicaid Other | Attending: Internal Medicine | Admitting: Internal Medicine

## 2018-05-26 ENCOUNTER — Other Ambulatory Visit: Payer: Self-pay

## 2018-05-26 DIAGNOSIS — Z7984 Long term (current) use of oral hypoglycemic drugs: Secondary | ICD-10-CM | POA: Insufficient documentation

## 2018-05-26 DIAGNOSIS — N760 Acute vaginitis: Secondary | ICD-10-CM | POA: Diagnosis present

## 2018-05-26 DIAGNOSIS — Z87891 Personal history of nicotine dependence: Secondary | ICD-10-CM | POA: Diagnosis not present

## 2018-05-26 DIAGNOSIS — Z8349 Family history of other endocrine, nutritional and metabolic diseases: Secondary | ICD-10-CM | POA: Diagnosis not present

## 2018-05-26 DIAGNOSIS — E1165 Type 2 diabetes mellitus with hyperglycemia: Secondary | ICD-10-CM | POA: Diagnosis not present

## 2018-05-26 DIAGNOSIS — Z9049 Acquired absence of other specified parts of digestive tract: Secondary | ICD-10-CM | POA: Diagnosis not present

## 2018-05-26 DIAGNOSIS — Z79899 Other long term (current) drug therapy: Secondary | ICD-10-CM | POA: Diagnosis not present

## 2018-05-26 DIAGNOSIS — Z792 Long term (current) use of antibiotics: Secondary | ICD-10-CM | POA: Diagnosis not present

## 2018-05-26 DIAGNOSIS — Z9889 Other specified postprocedural states: Secondary | ICD-10-CM | POA: Diagnosis not present

## 2018-05-26 DIAGNOSIS — Z811 Family history of alcohol abuse and dependence: Secondary | ICD-10-CM | POA: Insufficient documentation

## 2018-05-26 DIAGNOSIS — K219 Gastro-esophageal reflux disease without esophagitis: Secondary | ICD-10-CM | POA: Diagnosis not present

## 2018-05-26 DIAGNOSIS — Z813 Family history of other psychoactive substance abuse and dependence: Secondary | ICD-10-CM | POA: Diagnosis not present

## 2018-05-26 DIAGNOSIS — Z91013 Allergy to seafood: Secondary | ICD-10-CM | POA: Diagnosis not present

## 2018-05-26 DIAGNOSIS — Z833 Family history of diabetes mellitus: Secondary | ICD-10-CM | POA: Insufficient documentation

## 2018-05-26 LAB — POCT URINALYSIS DIP (DEVICE)
Bilirubin Urine: NEGATIVE
GLUCOSE, UA: 100 mg/dL — AB
HGB URINE DIPSTICK: NEGATIVE
KETONES UR: NEGATIVE mg/dL
LEUKOCYTES UA: NEGATIVE
Nitrite: NEGATIVE
Protein, ur: NEGATIVE mg/dL
SPECIFIC GRAVITY, URINE: 1.015 (ref 1.005–1.030)
Urobilinogen, UA: 0.2 mg/dL (ref 0.0–1.0)
pH: 6 (ref 5.0–8.0)

## 2018-05-26 LAB — POCT PREGNANCY, URINE: Preg Test, Ur: NEGATIVE

## 2018-05-26 MED ORDER — METRONIDAZOLE 500 MG PO TABS: 500.0000 mg | ORAL_TABLET | Freq: Two times a day (BID) | ORAL | 0 refills | Status: AC

## 2018-05-26 MED ORDER — FLUCONAZOLE 200 MG PO TABS: ORAL_TABLET | ORAL | 0 refills | Status: DC

## 2018-05-26 NOTE — Discharge Instructions (Signed)
We will treat for both yeast and bacterial vaginosis at this time.  Take one yeast pill today and then repeat at end of course of antibiotics.  7 days of antibiotics for bv. Do not drink alcohol while taking. Will notify you of any positive findings from your vaginal swab and if any changes to treatment are needed.   Please withhold from intercourse for the next week. Please use condoms to prevent STD's.   If symptoms worsen or do not improve in the next week to return to be seen or to follow up with your PCP.

## 2018-05-26 NOTE — ED Triage Notes (Addendum)
Vaginal itching, burning, discharge for a week, but increased in pain yesterday. Patient is NOT pregnant.    No back pain or abdominal pain

## 2018-05-26 NOTE — ED Provider Notes (Signed)
[facility]    CSN: [id number] Arrival date & time: 05/26/18  1115     History   Chief Complaint Chief Complaint  Patient presents with  . Vaginal Discharge    HPI Sandra [name] is a 27 y.o. female.   Sandra [name] presents with complaints of vaginal itching and irritation which started a week ago and is worse today. Has noticed thick white discharge. States has had some urinary frequency but this tends to be normal for her due to her diabetes. No pain with urination, no blood. No vaginal bleeding. LMP in July, has irregular periods related to nexplanon. She has two partners and does not use condoms. No known STD exposure. No abdominal pain, back pain, nausea or vomiting. Has had similar in the past and was BV. Hx of anemia, GERD, DM, yeast vaginitis.     ROS per HPI.      Past Medical History:  Diagnosis Date  . Anemia   . Diabetes mellitus without complication (HCC)   . GERD (gastroesophageal reflux disease)   . Hemorrhage after delivery of fetus    Intraoperative, received blood transfusion  . History of blood transfusion   . Hx of ectopic pregnancy     Patient Active Problem List   Diagnosis Date Noted  . Upper respiratory tract infection 09/03/2017  . Vaginal discharge 07/07/2016  . Achilles tendon pain 07/07/2016  . Diabetes mellitus without complication, without long-term current use of insulin (HCC) 05/26/2016  . Hyperglycemia 04/28/2016  . Anxiety state 04/28/2016  . Yeast vaginitis 04/28/2016  . GERD (gastroesophageal reflux disease) 04/28/2016  . Obesity 01/13/2014  . Halitosis 01/13/2014  . Implanon in place 01/13/2014  . Panic attacks 01/13/2014  . Health care maintenance 01/13/2014    Past Surgical History:  Procedure Laterality Date  . CESAREAN SECTION    . CESAREAN SECTION  04/23/2012   Procedure: CESAREAN SECTION;  Surgeon: Reva Bores, MD;  Location: [facility];  Service: Gynecology;  Laterality: N/A;  . CHOLECYSTECTOMY  10/23/2012   Procedure: LAPAROSCOPIC CHOLECYSTECTOMY WITH INTRAOPERATIVE CHOLANGIOGRAM;  Surgeon: Valarie Merino, MD;  Location: [facility];  Service: General;  Laterality: N/A;  . LAPAROSCOPY FOR ECTOPIC PREGNANCY      OB History    Gravida  4   Para  2   Term  2   Preterm      AB  1   Living  2     SAB      TAB      Ectopic  1   Multiple      Live Births  2            Home Medications    Prior to Admission medications   Medication Sig Start Date End Date Taking? Authorizing Provider  CHLOROPHYLL PO Take by mouth.   Yes [provider]  Probiotic Product (PROBIOTIC-10 PO) Take by mouth.   Yes [provider]  cyanocobalamin 500 MCG tablet Take 500 mcg by mouth daily.    [provider]  cycloSPORINE (RESTASIS MULTIDOSE) 0.05 % ophthalmic emulsion Place 1 drop into both eyes 2 (two) times daily. 06/21/16   [provider]  fluconazole (DIFLUCAN) 200 MG tablet Take once today. Take second pill at completion of antibiotics. 05/26/18   Linus Mako B, NP  glucose blood (ACCU-CHEK GUIDE) test strip Use as instructed 06/11/17   Renne Musca, MD  metFORMIN (GLUCOPHAGE) 500 MG tablet TAKE 1 TABLET BY MOUTH TWICE A DAY WITH MEALS  06/11/17   Renne Musca, MD  metroNIDAZOLE (FLAGYL) 500 MG tablet Take 1 tablet (500 mg total) by mouth 2 (two) times daily for 7 days. 05/26/18 06/02/18  Georgetta Haber, NP  Multiple Vitamin (MULTIVITAMIN WITH MINERALS) TABS tablet Take 1 tablet by mouth daily.    [provider]  omeprazole (PRILOSEC) 40 MG capsule Take 1 capsule (40 mg total) by mouth daily. 06/11/17   Renne Musca, MD    Family History Family History  Problem Relation Age of Onset  . High blood pressure Mother   . Aneurysm Mother   . Hypertension Mother   . Drug abuse Mother   . Diabetes Mellitus II Father   . Hyperlipidemia Father   . Alcohol abuse Father   . Heart disease Maternal Aunt   . Diabetes Paternal Grandmother   .  Diabetes Paternal Grandfather   . Other Neg Hx     Social History Social History   Tobacco Use  . Smoking status: Former Games developer  . Smokeless tobacco: Never Used  Substance Use Topics  . Alcohol use: Yes    Comment: occasionally  . Drug use: No     Allergies   Shellfish allergy   Review of Systems Review of Systems   Physical Exam Triage Vital Signs ED Triage Vitals  Enc Vitals Group     BP 05/26/18 1315 (!) 141/69     Pulse Rate 05/26/18 1315 79     Resp 05/26/18 1315 18     Temp 05/26/18 1313 99.2 F (37.3 C)     Temp Source 05/26/18 1313 Oral     SpO2 05/26/18 1315 100 %     Weight --      Height --      Head Circumference --      Peak Flow --      Pain Score 05/26/18 1308 6     Pain Loc --      Pain Edu? --      Excl. in GC? --    No data found.  Updated Vital Signs BP (!) 141/69 (BP Location: Left Arm) Comment (BP Location): large cuff  Pulse 79   Temp 99.2 F (37.3 C) (Oral)   Resp 18   LMP 03/18/2018   SpO2 100%   Breastfeeding? Unknown   Physical Exam  Constitutional: She is oriented to person, place, and time. She appears well-developed and well-nourished. No distress.  Cardiovascular: Normal rate, regular rhythm and normal heart sounds.  Pulmonary/Chest: Effort normal and breath sounds normal.  Abdominal: Soft. There is no tenderness. There is no rigidity, no rebound, no guarding and no CVA tenderness.  Genitourinary:  Genitourinary Comments: Denies sores, lesions, vaginal bleeding; no pelvic pain; gu exam deferred at this time, vaginal self swab collected.    Neurological: She is alert and oriented to person, place, and time.  Skin: Skin is warm and dry.     UC Treatments / Results  Labs (all labs ordered are listed, but only abnormal results are displayed) Labs Reviewed  POCT URINALYSIS DIP (DEVICE) - Abnormal; Notable for the following components:      Result Value   Glucose, UA 100 (*)    All other components within normal  limits  CERVICOVAGINAL ANCILLARY ONLY    EKG None  Radiology No results found.  Procedures Procedures (including critical care time)  Medications Ordered in UC Medications - No data to display  Initial Impression / Assessment and Plan / UC Course  I have reviewed the triage vital signs and the nursing notes.  Pertinent labs & imaging results that were available during my care of the patient were reviewed by me and considered in my medical decision making (see chart for details).     ua without indication of infection. Afebrile. No abdominal pain. Diflucan and flagyl initiated. Vaginal cytology collected and pending. Will notify of any positive findings and if any changes to treatment are needed.  Encouraged safe sex practices. If symptoms worsen or do not improve in the next week to return to be seen or to follow up with PCP.  Patient verbalized understanding and agreeable to plan.    Final Clinical Impressions(s) / UC Diagnoses   Final diagnoses:  Acute vaginitis     Discharge Instructions     We will treat for both yeast and bacterial vaginosis at this time.  Take one yeast pill today and then repeat at end of course of antibiotics.  7 days of antibiotics for bv. Do not drink alcohol while taking. Will notify you of any positive findings from your vaginal swab and if any changes to treatment are needed.   Please withhold from intercourse for the next week. Please use condoms to prevent STD's.   If symptoms worsen or do not improve in the next week to return to be seen or to follow up with your PCP.       ED Prescriptions    Medication Sig Dispense Auth. Provider   fluconazole (DIFLUCAN) 200 MG tablet Take once today. Take second pill at completion of antibiotics. 2 tablet Linus Mako B, NP   metroNIDAZOLE (FLAGYL) 500 MG tablet Take 1 tablet (500 mg total) by mouth 2 (two) times daily for 7 days. 14 tablet Georgetta Haber, NP     Controlled Substance  Prescriptions [place] Controlled Substance Registry consulted? Not Applicable   Georgetta Haber, NP 05/26/18 1352

## 2018-05-27 LAB — CERVICOVAGINAL ANCILLARY ONLY
Bacterial vaginitis: POSITIVE — AB
Candida vaginitis: NEGATIVE
Chlamydia: NEGATIVE
Neisseria Gonorrhea: NEGATIVE
Trichomonas: NEGATIVE

## 2018-05-30 ENCOUNTER — Other Ambulatory Visit: Payer: Self-pay

## 2018-05-30 DIAGNOSIS — K219 Gastro-esophageal reflux disease without esophagitis: Secondary | ICD-10-CM

## 2018-05-30 MED ORDER — OMEPRAZOLE 40 MG PO CPDR: 40.0000 mg | DELAYED_RELEASE_CAPSULE | Freq: Every day | ORAL | 3 refills | Status: DC

## 2018-06-10 ENCOUNTER — Encounter (HOSPITAL_BASED_OUTPATIENT_CLINIC_OR_DEPARTMENT_OTHER): Payer: Self-pay

## 2018-06-10 ENCOUNTER — Emergency Department (HOSPITAL_BASED_OUTPATIENT_CLINIC_OR_DEPARTMENT_OTHER): Payer: Medicaid Other

## 2018-06-10 ENCOUNTER — Emergency Department (HOSPITAL_BASED_OUTPATIENT_CLINIC_OR_DEPARTMENT_OTHER)
Admission: EM | Admit: 2018-06-10 | Discharge: 2018-06-10 | Disposition: A | Discharge status: Home / Self Care | Payer: Medicaid Other | Attending: Emergency Medicine | Admitting: Emergency Medicine

## 2018-06-10 DIAGNOSIS — Z79899 Other long term (current) drug therapy: Secondary | ICD-10-CM | POA: Insufficient documentation

## 2018-06-10 DIAGNOSIS — M545 Low back pain, unspecified: Secondary | ICD-10-CM

## 2018-06-10 DIAGNOSIS — R109 Unspecified abdominal pain: Secondary | ICD-10-CM

## 2018-06-10 DIAGNOSIS — Z87891 Personal history of nicotine dependence: Secondary | ICD-10-CM | POA: Diagnosis not present

## 2018-06-10 DIAGNOSIS — E119 Type 2 diabetes mellitus without complications: Secondary | ICD-10-CM | POA: Insufficient documentation

## 2018-06-10 DIAGNOSIS — Z7984 Long term (current) use of oral hypoglycemic drugs: Secondary | ICD-10-CM | POA: Diagnosis not present

## 2018-06-10 LAB — URINALYSIS, ROUTINE W REFLEX MICROSCOPIC
Bilirubin Urine: NEGATIVE
GLUCOSE, UA: NEGATIVE mg/dL
Hgb urine dipstick: NEGATIVE
KETONES UR: NEGATIVE mg/dL
Leukocytes, UA: NEGATIVE
Nitrite: NEGATIVE
Protein, ur: NEGATIVE mg/dL
Specific Gravity, Urine: 1.02 (ref 1.005–1.030)
pH: 6.5 (ref 5.0–8.0)

## 2018-06-10 LAB — BASIC METABOLIC PANEL
Anion gap: 9 (ref 5–15)
BUN: 12 mg/dL (ref 6–20)
CO2: 24 mmol/L (ref 22–32)
CREATININE: 0.63 mg/dL (ref 0.44–1.00)
Calcium: 8.9 mg/dL (ref 8.9–10.3)
Chloride: 105 mmol/L (ref 98–111)
GFR calc Af Amer: >60 mL/min (ref >60)
GLUCOSE: 124 mg/dL — AB (ref 70–99)
POTASSIUM: 4.1 mmol/L (ref 3.5–5.1)
SODIUM: 138 mmol/L (ref 135–145)

## 2018-06-10 LAB — CBC WITH DIFFERENTIAL/PLATELET
Basophils Absolute: 0 10*3/uL (ref 0.0–0.1)
Basophils Relative: 0 %
EOS ABS: 0.1 10*3/uL (ref 0.0–0.7)
EOS PCT: 2 %
HCT: 38.5 % (ref 36.0–46.0)
Hemoglobin: 11.9 g/dL — ABNORMAL LOW (ref 12.0–15.0)
LYMPHS ABS: 2 10*3/uL (ref 0.7–4.0)
LYMPHS PCT: 35 %
MCH: 23.5 pg — AB (ref 26.0–34.0)
MCHC: 30.9 g/dL (ref 30.0–36.0)
MCV: 75.9 fL — AB (ref 78.0–100.0)
MONO ABS: 0.4 10*3/uL (ref 0.1–1.0)
MONOS PCT: 7 %
Neutro Abs: 3.2 10*3/uL (ref 1.7–7.7)
Neutrophils Relative %: 56 %
PLATELETS: 223 10*3/uL (ref 150–400)
RBC: 5.07 MIL/uL (ref 3.87–5.11)
RDW: 17 % — AB (ref 11.5–15.5)
WBC: 5.7 10*3/uL (ref 4.0–10.5)

## 2018-06-10 LAB — PREGNANCY, URINE: Preg Test, Ur: NEGATIVE

## 2018-06-10 MED ORDER — ONDANSETRON HCL 4 MG/2ML IJ SOLN
4.0000 mg | Freq: Once | INTRAMUSCULAR | Status: AC
  Administered 2018-06-10: 4 mg via INTRAVENOUS
  Filled 2018-06-10: qty 2

## 2018-06-10 MED ORDER — SODIUM CHLORIDE 0.9 % IV BOLUS: 500.0000 mL | Freq: Once | INTRAVENOUS | Status: DC

## 2018-06-10 MED ORDER — FAMOTIDINE IN NACL 20-0.9 MG/50ML-% IV SOLN: 20.0000 mg | Freq: Once | INTRAVENOUS | Status: DC

## 2018-06-10 MED ORDER — MORPHINE SULFATE (PF) 2 MG/ML IV SOLN: 2.0000 mg | Freq: Once | INTRAVENOUS | Status: DC

## 2018-06-10 MED ORDER — METHOCARBAMOL 500 MG PO TABS: 500.0000 mg | ORAL_TABLET | Freq: Every evening | ORAL | 0 refills | Status: DC | PRN

## 2018-06-10 MED ORDER — METOCLOPRAMIDE HCL 5 MG/ML IJ SOLN: 10.0000 mg | Freq: Once | INTRAMUSCULAR | Status: DC

## 2018-06-10 MED ORDER — NAPROXEN 500 MG PO TABS: 500.0000 mg | ORAL_TABLET | Freq: Two times a day (BID) | ORAL | 0 refills | Status: DC

## 2018-06-10 MED FILL — METHOCARBAMOL 500 MG TABLET: 500 | 10 days supply | Qty: 10 | Fill #0

## 2018-06-10 MED FILL — NAPROXEN 500 MG TABLET: 500 | 15 days supply | Qty: 30 | Fill #0

## 2018-06-10 NOTE — Discharge Instructions (Signed)
Take naproxen 2 times a day with meals.  Do not take other anti-inflammatories at the same time (Advil, Motrin, ibuprofen, Aleve). You may supplement with Tylenol if you need further pain control. Use Robaxin as needed for muscle stiffness or soreness. Have caution, as this may make you tired or groggy. Do not drive or operate heavy machinery while taking this medication.  Use muscle creams (bengay, icy hot, salonpas) as needed for pain.  Use heat or ice to help with pain.  Try gentle back stretches for pain.  Follow up with your primary care doctor if pain is not improving with this treatment in 1 week.  Return to the ER if you develop high fevers, numbness, loss of bowel or bladder control, or any new or concerning symptoms.

## 2018-06-10 NOTE — ED Provider Notes (Signed)
[facility] EMERGENCY DEPARTMENT Provider Note   CSN: [id number] Arrival date & time: 06/10/18  7829     History   Chief Complaint Chief Complaint  Patient presents with  . Flank Pain    HPI Sandra [name] is a 27 y.o. female presenting for evaluation of low back pain and left flank pain.  Patient states for the past 3 days, she has been having low back pain and left-sided flank pain.  Her left leg pain is worse after urination.  Pain is worse with movement.  She denies fall, trauma, or injury.  She does not report she is doing when the pain began.  She denies history of similar.  She has not been taking anything for pain including Tylenol or ibuprofen.  Nothing makes it better.  She denies radiation down her legs.  Patient reports associated nausea this morning, but states she has had nothing to eat today, this may be the cause of her nausea.  No vomiting.  No nausea yesterday.  She denies fevers, chills, chest pain, shortness breath, anterior abdominal pain, or abnormal bowel movements.  She denies dysuria, hematuria, urinary frequency.  She denies history of kidney stones or history of UTIs.  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  Patient reports a history of diabetes for which she takes medication, blood sugars have been well controlled between 120 and 130 over the past several days.  HPI  Past Medical History:  Diagnosis Date  . Anemia   . Diabetes mellitus without complication (HCC)   . GERD (gastroesophageal reflux disease)   . Hemorrhage after delivery of fetus    Intraoperative, received blood transfusion  . History of blood transfusion   . Hx of ectopic pregnancy     Patient Active Problem List   Diagnosis Date Noted  . Upper respiratory tract infection 09/03/2017  . Vaginal discharge 07/07/2016  . Achilles tendon pain 07/07/2016  . Diabetes mellitus without complication, without long-term current use of insulin (HCC)  05/26/2016  . Hyperglycemia 04/28/2016  . Anxiety state 04/28/2016  . Yeast vaginitis 04/28/2016  . GERD (gastroesophageal reflux disease) 04/28/2016  . Obesity 01/13/2014  . Halitosis 01/13/2014  . Implanon in place 01/13/2014  . Panic attacks 01/13/2014  . Health care maintenance 01/13/2014    Past Surgical History:  Procedure Laterality Date  . CESAREAN SECTION    . CESAREAN SECTION  04/23/2012   Procedure: CESAREAN SECTION;  Surgeon: Reva Bores, MD;  Location: [facility]S;  Service: Gynecology;  Laterality: N/A;  . CHOLECYSTECTOMY  10/23/2012   Procedure: LAPAROSCOPIC CHOLECYSTECTOMY WITH INTRAOPERATIVE CHOLANGIOGRAM;  Surgeon: Valarie Merino, MD;  Location: [facility];  Service: General;  Laterality: N/A;  . LAPAROSCOPY FOR ECTOPIC PREGNANCY       OB History    Gravida  4   Para  2   Term  2   Preterm      AB  1   Living  2     SAB      TAB      Ectopic  1   Multiple      Live Births  2            Home Medications    Prior to Admission medications   Medication Sig Start Date End Date Taking? Authorizing Provider  CHLOROPHYLL PO Take by mouth.    [provider]  cyanocobalamin 500 MCG tablet Take 500 mcg by mouth daily.  [provider]  cycloSPORINE (RESTASIS MULTIDOSE) 0.05 % ophthalmic emulsion Place 1 drop into both eyes 2 (two) times daily. 06/21/16   [provider]  fluconazole (DIFLUCAN) 200 MG tablet Take once today. Take second pill at completion of antibiotics. 05/26/18   Linus MakoBurky, Natalie B, NP  glucose blood (ACCU-CHEK GUIDE) test strip Use as instructed 06/11/17   Renne MuscaWarden, Daniel L, MD  metFORMIN (GLUCOPHAGE) 500 MG tablet TAKE 1 TABLET BY MOUTH TWICE A DAY WITH MEALS 06/11/17   Renne MuscaWarden, Daniel L, MD  methocarbamol (ROBAXIN) 500 MG tablet Take 1 tablet (500 mg total) by mouth at bedtime as needed for muscle spasms. 06/10/18   Jlon Betker, PA-C  Multiple Vitamin (MULTIVITAMIN WITH MINERALS) TABS tablet Take 1 tablet by  mouth daily.    [provider]  naproxen (NAPROSYN) 500 MG tablet Take 1 tablet (500 mg total) by mouth 2 (two) times daily with a meal. 06/10/18   Darian Cansler, PA-C  omeprazole (PRILOSEC) 40 MG capsule Take 1 capsule (40 mg total) by mouth daily. 05/30/18   Wendee BeaversMcMullen, David J, DO  Probiotic Product (PROBIOTIC-10 PO) Take by mouth.    [provider]    Family History Family History  Problem Relation Age of Onset  . High blood pressure Mother   . Aneurysm Mother   . Hypertension Mother   . Drug abuse Mother   . Diabetes Mellitus II Father   . Hyperlipidemia Father   . Alcohol abuse Father   . Heart disease Maternal Aunt   . Diabetes Paternal Grandmother   . Diabetes Paternal Grandfather   . Other Neg Hx     Social History Social History   Tobacco Use  . Smoking status: Former Games developermoker  . Smokeless tobacco: Never Used  Substance Use Topics  . Alcohol use: Yes    Comment: occasionally  . Drug use: No     Allergies   Shellfish allergy   Review of Systems Review of Systems  Gastrointestinal: Positive for nausea.  Genitourinary: Positive for flank pain.  Musculoskeletal: Positive for back pain.  All other systems reviewed and are negative.    Physical Exam Updated Vital Signs BP 129/61 (BP Location: Right Arm)   Pulse 74   Temp 98.7 F (37.1 C) (Oral)   Resp 18   SpO2 100%   Physical Exam  Constitutional: She is oriented to person, place, and time. She appears well-developed and well-nourished. No distress.  Sitting comfortably in the bed in no acute distress  HENT:  Head: Normocephalic and atraumatic.  Eyes: Pupils are equal, round, and reactive to light. Conjunctivae and EOM are normal.  Neck: Normal range of motion. Neck supple.  Cardiovascular: Normal rate, regular rhythm and intact distal pulses.  Pulmonary/Chest: Effort normal and breath sounds normal. No respiratory distress. She has no wheezes.  Abdominal: Soft. She exhibits no  distension and no mass. There is no tenderness. There is no rebound and no guarding.  No tenderness palpation of the abdomen.  Soft without rigidity, guarding, distention.  No rebound.  Mild left-sided flank tenderness.  Musculoskeletal: Normal range of motion. She exhibits tenderness. She exhibits no edema or deformity.  Tenderness to palpation of low back musculature bilaterally.  No tenderness palpation of her midline spine.  No tenderness palpation of upper back.  Strength intact in Serpe sensation intact x4.  Radial pedal pulses intact bilaterally.  No pain with straight leg raise.  Patient is ambulatory.  Neurological: She is alert and oriented to person,  place, and time. No sensory deficit.  Skin: Skin is warm and dry. Capillary refill takes less than 2 seconds.  Psychiatric: She has a normal mood and affect.  Nursing note and vitals reviewed.    ED Treatments / Results  Labs (all labs ordered are listed, but only abnormal results are displayed) Labs Reviewed  CBC WITH DIFFERENTIAL/PLATELET - Abnormal; Notable for the following components:      Result Value   Hemoglobin 11.9 (*)    MCV 75.9 (*)    MCH 23.5 (*)    RDW 17.0 (*)    All other components within normal limits  BASIC METABOLIC PANEL - Abnormal; Notable for the following components:   Glucose, Bld 124 (*)    All other components within normal limits  URINALYSIS, ROUTINE W REFLEX MICROSCOPIC  PREGNANCY, URINE    EKG None  Radiology Ct Renal Stone Study  Result Date: 06/10/2018 CLINICAL DATA:  27 year old female left flank pain for 2 days. Prior cholecystectomy. Initial encounter. EXAM: CT ABDOMEN AND PELVIS WITHOUT CONTRAST TECHNIQUE: Multidetector CT imaging of the abdomen and pelvis was performed following the standard protocol without IV contrast. COMPARISON:  None. FINDINGS: Lower chest: No worrisome lung base abnormality. Heart top-normal size. Hepatobiliary: Top-normal size liver. Possible small area of focal  fatty infiltration adjacent to the fissure for the falciform ligament. Taking into account limitation by non contrast imaging, no worrisome hepatic lesion. Post cholecystectomy. No calcified common bile duct stone. Pancreas: Taking into account limitation by non contrast imaging, no worrisome pancreatic mass or inflammation. Spleen: Taking into account limitation by non contrast imaging, no worrisome splenic mass or enlargement. Adrenals/Urinary Tract: No obstructing renal/ureteral stone or hydronephrosis. Possible tiny nonobstructing right renal calculi. Taking into account limitation by non contrast imaging, no worrisome renal or adrenal lesion. Partially contracted noncontrast filled views the urinary bladder unremarkable. Stomach/Bowel: No extraluminal bowel inflammatory process. Portions of stomach, small bowel and colon under distended and evaluation slightly limited. No inflammation surrounds the appendix. Vascular/Lymphatic: No abdominal aortic aneurysm. Scattered normal size lymph nodes. Reproductive: No worrisome uterine or adnexal mass. Other: No free air or bowel containing hernia. Diastasis rectus muscles. Musculoskeletal: Degenerative changes L5-S1. IMPRESSION: 1. No obstructing renal/ureteral stone or hydronephrosis. 2. No extraluminal bowel inflammatory process detected. 3. Mild degenerative changes L5-S1. 4. Post cholecystectomy. Electronically Signed   By: Lacy Duverney M.D.   On: 06/10/2018 11:36    Procedures Procedures (including critical care time)  Medications Ordered in ED Medications  ondansetron (ZOFRAN) injection 4 mg (4 mg Intravenous Given 06/10/18 1113)     Initial Impression / Assessment and Plan / ED Course  I have reviewed the triage vital signs and the nursing notes.  Pertinent labs & imaging results that were available during my care of the patient were reviewed by me and considered in my medical decision making (see chart for details).     Pt presenting for  evaluation of low back pain and left-sided flank pain.  Physical exam reassuring, she is afebrile not tachycardic.  Appears nontoxic.  Does not appear in significant amount of pain.  Pt states she doesn't need or want anything for pain right now.  However, patient reporting increased pain after urination.  Consider kidney stone versus UTI versus pyelo.  Patient also with bilateral low back pain, reproducible with palpation of the musculature.  Works in a physically demanding job.  Consider MSK causes of pain.  Will obtain labs, urine, CT renal, and reassess.  Labs reassuring, no  leukocytosis.  Creatinine stable.  Hemoglobin mildly low, history of anemia.  Urine without infection or blood.  CT renal without kidney stone, hydronephrosis, or obvious infection.  As labs and imaging are reassuring, symptoms are likely due to musculoskeletal cause.   Discussed findings with patient.  Discussed treatment with NSAIDs, muscle relaxers, and muscle creams.  Patient to follow-up with her PCP if symptoms are not improving.  At this time, patient appears safe for discharge.  Return precautions given.  Patient states she understands agrees plan.   Final Clinical Impressions(s) / ED Diagnoses   Final diagnoses:  Acute bilateral low back pain without sciatica  Left flank pain    ED Discharge Orders         Ordered    methocarbamol (ROBAXIN) 500 MG tablet  At bedtime PRN     06/10/18 1217    naproxen (NAPROSYN) 500 MG tablet  2 times daily with meals     06/10/18 1217           Yanett Conkright, PA-C 06/10/18 1228    Cathren Laine, MD 06/10/18 1420

## 2018-06-10 NOTE — ED Triage Notes (Signed)
Pt c/o lt flank pain/back pain worse after urination

## 2018-06-10 NOTE — ED Notes (Signed)
Patient transported to CT 

## 2018-06-11 ENCOUNTER — Ambulatory Visit: Payer: Medicaid Other

## 2018-06-25 ENCOUNTER — Other Ambulatory Visit (HOSPITAL_COMMUNITY)
Admission: RE | Admit: 2018-06-25 | Discharge: 2018-06-25 | Disposition: A | Discharge status: Home / Self Care | Payer: Medicaid Other | Source: Ambulatory Visit | Attending: Family Medicine | Admitting: Family Medicine

## 2018-06-25 ENCOUNTER — Ambulatory Visit (INDEPENDENT_AMBULATORY_CARE_PROVIDER_SITE_OTHER): Payer: Medicaid Other | Admitting: Family Medicine

## 2018-06-25 ENCOUNTER — Encounter: Payer: Self-pay | Admitting: Family Medicine

## 2018-06-25 ENCOUNTER — Other Ambulatory Visit: Payer: Self-pay

## 2018-06-25 VITALS — BP 124/76 | HR 86 | Temp 98.5°F | Ht 59.0 in | Wt 239.0 lb

## 2018-06-25 DIAGNOSIS — R196 Halitosis: Secondary | ICD-10-CM | POA: Insufficient documentation

## 2018-06-25 DIAGNOSIS — Z113 Encounter for screening for infections with a predominantly sexual mode of transmission: Secondary | ICD-10-CM | POA: Diagnosis not present

## 2018-06-25 DIAGNOSIS — E139 Other specified diabetes mellitus without complications: Secondary | ICD-10-CM

## 2018-06-25 DIAGNOSIS — Z23 Encounter for immunization: Secondary | ICD-10-CM | POA: Insufficient documentation

## 2018-06-25 DIAGNOSIS — E119 Type 2 diabetes mellitus without complications: Secondary | ICD-10-CM | POA: Diagnosis not present

## 2018-06-25 DIAGNOSIS — Z7984 Long term (current) use of oral hypoglycemic drugs: Secondary | ICD-10-CM | POA: Insufficient documentation

## 2018-06-25 LAB — POCT GLYCOSYLATED HEMOGLOBIN (HGB A1C): HbA1c, POC (controlled diabetic range): 7.9 % — AB (ref 0.0–7.0)

## 2018-06-25 MED ORDER — METFORMIN HCL 1000 MG PO TABS: 1000.0000 mg | ORAL_TABLET | Freq: Two times a day (BID) | ORAL | 3 refills | Status: DC

## 2018-06-25 MED ORDER — FLUTICASONE PROPIONATE 50 MCG/ACT NA SUSP: 1.0000 | Freq: Every day | NASAL | 2 refills | Status: DC

## 2018-06-25 NOTE — Progress Notes (Signed)
   CC: DM, vaginal skin change  HPI  C/w STIs. No vag discharge. Has bump that she wants me to look at. Bump has been there since yesterday. No drainage that she knows of. No fever. A little pain. Feels safe in her relationship, but knows he has sex with others.   DM - metfomin BID 500mg . No SEs on concerns. A1c 7.0 > 7.9. Has lost 33 lbs. Has been a lot more active with her job. 30 min HIIT class.   Birth control - nexplanon.   Mood - sees a therapist. No meds at present.   Halitosis - Had tonsils removed for the odor, has been going on for years. Dentist said it wasn't a tooth issue. ENT took tonsils, no other thoughts from them per her report. She says her daughter, partner, and people at work notice this. She has tried copious mouth hygeine, never tried treatments for post nasal drip.   Wt Readings from Last 3 Encounters:  06/25/18 239 lb (108.4 kg)  05/23/18 245 lb (111.1 kg)  03/11/18 271 lb (122.9 kg)   ROS: Denies CP, SOB, abdominal pain, dysuria, changes in BMs.   CC, SH/smoking status, and VS noted  Objective: BP 124/76   Pulse 86   Temp 98.5 F (36.9 C) (Oral)   Ht 4\' 11"  (1.499 m)   Wt 239 lb (108.4 kg)   SpO2 99%   Breastfeeding? No   BMI 48.27 kg/m  Gen: NAD, alert, cooperative, and pleasant. HEENT: NCAT, EOMI, PERRL, TMs clear, no oropharyngeal abnormalities, dentition appropriate CV: RRR, no murmur Resp: CTAB, no wheezes, non-labored GU: 1cm area of excoriation or maceration over R caudal labial, no discharge or vesicle or bulla. Mildly TTP.  Ext: No edema, warm Neuro: Alert and oriented, Speech clear, No gross deficits  Assessment and plan:  Vaginal lesion - question secondary excoriation or friction or shaving related irritation. Considered HSV, but no fluid was able to be sampled for swab. Check g/c HIV RPR at patient request.   Halitosis Trial flonase for post nasal drip component. If no improvement consider ENT re referral (prior was many years  ago)  Diabetes mellitus without complication, without long-term current use of insulin (HCC) Increase metformin to 1000mg  BID, continue great lifestyle changes, recheck 3 months.    Orders Placed This Encounter  Procedures  . Flu Vaccine QUAD 36+ mos IM  . CBC  . Basic metabolic panel  . HIV Antibody (routine testing w rflx)  . RPR  . HgB A1c    Meds ordered this encounter  Medications  . metFORMIN (GLUCOPHAGE) 1000 MG tablet    Sig: Take 1 tablet (1,000 mg total) by mouth 2 (two) times daily with a meal.    Dispense:  180 tablet    Refill:  3  . fluticasone (FLONASE) 50 MCG/ACT nasal spray    Sig: Place 1 spray into both nostrils daily.    Dispense:  16 g    Refill:  2    Health Maintenance reviewed - flu shot today as above.  Loni Muse, MD, PGY3 06/28/2018 8:30 AM

## 2018-06-25 NOTE — Patient Instructions (Signed)
It was a pleasure to see you today! Thank you for choosing [facility] for your primary care. Sandra [name] was seen for physical.   Our plans for today were:  For your diabetes, increase your metformin to 1000mg  BID, and keep up the great work with your activity!   For the lesion, let's keep an eye on it. I think it's irritation.   I will call you if any of the blood testing is positive.   Best,  Dr. Chanetta Marshall

## 2018-06-26 LAB — BASIC METABOLIC PANEL
BUN/Creatinine Ratio: 18 (ref 9–23)
BUN: 12 mg/dL (ref 6–20)
CHLORIDE: 102 mmol/L (ref 96–106)
CO2: 19 mmol/L — AB (ref 20–29)
Calcium: 9.3 mg/dL (ref 8.7–10.2)
Creatinine, Ser: 0.65 mg/dL (ref 0.57–1.00)
GFR, EST AFRICAN AMERICAN: 140 mL/min/{1.73_m2} (ref >59)
GFR, EST NON AFRICAN AMERICAN: 121 mL/min/{1.73_m2} (ref >59)
Glucose: 93 mg/dL (ref 65–99)
POTASSIUM: 3.8 mmol/L (ref 3.5–5.2)
SODIUM: 140 mmol/L (ref 134–144)

## 2018-06-26 LAB — CBC
Hematocrit: 34.5 % (ref 34.0–46.6)
Hemoglobin: 10.5 g/dL — ABNORMAL LOW (ref 11.1–15.9)
MCH: 22.7 pg — ABNORMAL LOW (ref 26.6–33.0)
MCHC: 30.4 g/dL — AB (ref 31.5–35.7)
MCV: 75 fL — ABNORMAL LOW (ref 79–97)
PLATELETS: 283 10*3/uL (ref 150–450)
RBC: 4.62 x10E6/uL (ref 3.77–5.28)
RDW: 15.2 % (ref 12.3–15.4)
WBC: 6 10*3/uL (ref 3.4–10.8)

## 2018-06-26 LAB — HIV ANTIBODY (ROUTINE TESTING W REFLEX): HIV Screen 4th Generation wRfx: NONREACTIVE

## 2018-06-26 LAB — RPR: RPR: NONREACTIVE

## 2018-06-27 LAB — CERVICOVAGINAL ANCILLARY ONLY
Chlamydia: NEGATIVE
Neisseria Gonorrhea: NEGATIVE

## 2018-06-28 ENCOUNTER — Telehealth: Payer: Self-pay

## 2018-06-28 MED ORDER — FERROUS SULFATE 325 (65 FE) MG PO TABS: 325.0000 mg | ORAL_TABLET | ORAL | 3 refills | Status: DC

## 2018-06-28 NOTE — Telephone Encounter (Signed)
Patient returned call to PCP stating she has been anemic in past and on iron supplements in past that she stopped taking.   Please let her know next steps.  Call back is [phone]  Ples Specter, RN [facility] RN)

## 2018-06-28 NOTE — Telephone Encounter (Signed)
I will re order her iron supplement - she should take this every OTHER day to help it absorb best. We will recheck labs in a couple of months to see how this is working. If she gets constipated using iron, she can use colace (docusate) OTC.

## 2018-06-28 NOTE — Assessment & Plan Note (Signed)
Trial flonase for post nasal drip component. If no improvement consider ENT re referral (prior was many years ago)

## 2018-06-28 NOTE — Telephone Encounter (Signed)
Pt informed of below. Zimmerman Rumple, Cruze Zingaro D, CMA  

## 2018-06-28 NOTE — Assessment & Plan Note (Signed)
Increase metformin to 1000mg  BID, continue great lifestyle changes, recheck 3 months.

## 2018-08-01 ENCOUNTER — Ambulatory Visit (INDEPENDENT_AMBULATORY_CARE_PROVIDER_SITE_OTHER): Payer: Medicaid Other | Admitting: Family Medicine

## 2018-08-01 ENCOUNTER — Encounter: Payer: Self-pay | Admitting: Family Medicine

## 2018-08-01 ENCOUNTER — Other Ambulatory Visit: Payer: Self-pay

## 2018-08-01 ENCOUNTER — Telehealth: Payer: Self-pay

## 2018-08-01 VITALS — BP 130/72 | HR 99 | Temp 98.6°F | Ht 59.0 in | Wt 232.0 lb

## 2018-08-01 DIAGNOSIS — J208 Acute bronchitis due to other specified organisms: Secondary | ICD-10-CM | POA: Diagnosis not present

## 2018-08-01 MED ORDER — GUAIFENESIN-CODEINE 100-10 MG/5ML PO SOLN: 5.0000 mL | Freq: Four times a day (QID) | ORAL | 0 refills | Status: DC | PRN

## 2018-08-01 NOTE — Telephone Encounter (Signed)
Patient stated after she left here she took her daughter to the doctor and her daughter tested positive for strep throat. Doctor thought she should call back and ask for an abx for herself in case she has it also.  Call back is [phone]  Ples SpecterAlisa Brake, RN [facility] RN)

## 2018-08-01 NOTE — Patient Instructions (Signed)

## 2018-08-02 DIAGNOSIS — J208 Acute bronchitis due to other specified organisms: Secondary | ICD-10-CM | POA: Insufficient documentation

## 2018-08-02 MED ORDER — PENICILLIN V POTASSIUM 500 MG PO TABS: 500.0000 mg | ORAL_TABLET | Freq: Three times a day (TID) | ORAL | 0 refills | Status: DC

## 2018-08-02 NOTE — Progress Notes (Signed)
   Subjective:    Patient ID: Sandra [name], female    DOB: 09-09-1991, 27 y.o.   MRN: [id number]  HPI UPPER RESPIRATORY INFECTION  Onset: last weekend  Course: worsening Better with: nothing Meds tried: OTC cough medicine Sick contacts: dgt with cough and sore throat, Dx with Strep pharyngitis  Nasal discharge (color,laterality): no  Sinusitis Risk Factors Fever: no   Headache/face pain: no  Double sickening: no  Tooth pain: no   Allergy Risk Factors: Sneezing: no  Itchy scratchy throat: yes  Seasonal sx: no   Flu Risk Factors Headache: yes  Muscle aches: no  Severe fatigue: no    Red Flags  Stiff neck: no  Dyspnea: yes  Rash: no  Swallowing difficulty: no   SH: nonsmoker        Working in [employer], has been working all week   Review of Systems See hpi    Objective:   Physical Exam VS reviewed GEN: Alert, Cooperative, Groomed, wearing FM, appeared tired HEENT: PERRL; EAC bilaterally not occluded, TM's translucent with normal LM, (+) LR;                No cervical LAN, No thyromegaly, No palpable masses, OP with coblestoning.  No tonsilar hypertrophy or erythma or exudate COR: RRR, No M/G/R, No JVD, Normal PMI size and location LUNGS: BCTA, No Acc mm use, speaking in full sentences SKIN: No lesion nor rashes of face Gait: Normal speed, No significant path deviation, Step through +,  Psych: Normal affect/thought/speech/language    Assessment & Plan:  Visit Problem List with A/P  Acute viral bronchitis New acute problem While my diagnosis of viral bronchitis is likely correct (CENTOR = Zero), the dgt was diagnosed with Strep throat after Sandra [name]'s visit with me.  She called back asking that she be covered for strep throat.   Rx Pen V 500 mg TID for 10 days.  Rest, fluids, work note thru 08/02/18 Return recautions Pt ed sheet.

## 2018-08-02 NOTE — Telephone Encounter (Signed)
LM for patient that medication was sent into pharmacy.  Aubert Choyce,CMA

## 2018-08-02 NOTE — Assessment & Plan Note (Signed)
New acute problem While my diagnosis of viral bronchitis is likely correct (CENTOR = Zero), the dgt was diagnosed with Strep throat after Ms Wich's visit with me.  She called back asking that she be covered for strep throat.   Rx Pen V 500 mg TID for 10 days.  Rest, fluids, work note thru 08/02/18 Return recautions Pt ed sheet.

## 2018-08-02 NOTE — Telephone Encounter (Signed)
While it would be unusual for a patient to have a cough and no fever with strep pharyngitis, Dr Kinneth Fujiwara will call in antibiotics.

## 2018-08-05 ENCOUNTER — Emergency Department (HOSPITAL_BASED_OUTPATIENT_CLINIC_OR_DEPARTMENT_OTHER): Payer: Medicaid Other

## 2018-08-05 ENCOUNTER — Emergency Department (HOSPITAL_BASED_OUTPATIENT_CLINIC_OR_DEPARTMENT_OTHER)
Admission: EM | Admit: 2018-08-05 | Discharge: 2018-08-05 | Disposition: A | Discharge status: Home / Self Care | Payer: Medicaid Other | Attending: Emergency Medicine | Admitting: Emergency Medicine

## 2018-08-05 ENCOUNTER — Encounter (HOSPITAL_BASED_OUTPATIENT_CLINIC_OR_DEPARTMENT_OTHER): Payer: Self-pay | Admitting: Emergency Medicine

## 2018-08-05 ENCOUNTER — Other Ambulatory Visit: Payer: Self-pay

## 2018-08-05 DIAGNOSIS — R05 Cough: Secondary | ICD-10-CM

## 2018-08-05 DIAGNOSIS — Z87891 Personal history of nicotine dependence: Secondary | ICD-10-CM | POA: Insufficient documentation

## 2018-08-05 DIAGNOSIS — R062 Wheezing: Secondary | ICD-10-CM

## 2018-08-05 DIAGNOSIS — E119 Type 2 diabetes mellitus without complications: Secondary | ICD-10-CM | POA: Diagnosis not present

## 2018-08-05 DIAGNOSIS — Z7984 Long term (current) use of oral hypoglycemic drugs: Secondary | ICD-10-CM | POA: Insufficient documentation

## 2018-08-05 DIAGNOSIS — J189 Pneumonia, unspecified organism: Secondary | ICD-10-CM

## 2018-08-05 DIAGNOSIS — R0602 Shortness of breath: Secondary | ICD-10-CM | POA: Diagnosis present

## 2018-08-05 DIAGNOSIS — R059 Cough, unspecified: Secondary | ICD-10-CM

## 2018-08-05 DIAGNOSIS — Z79899 Other long term (current) drug therapy: Secondary | ICD-10-CM | POA: Diagnosis not present

## 2018-08-05 LAB — COMPREHENSIVE METABOLIC PANEL
ALK PHOS: 62 U/L (ref 38–126)
ALT: 14 U/L (ref 0–44)
ANION GAP: 12 (ref 5–15)
AST: 18 U/L (ref 15–41)
Albumin: 3.6 g/dL (ref 3.5–5.0)
BILIRUBIN TOTAL: 0.1 mg/dL — AB (ref 0.3–1.2)
BUN: 12 mg/dL (ref 6–20)
CALCIUM: 9.2 mg/dL (ref 8.9–10.3)
CO2: 25 mmol/L (ref 22–32)
Chloride: 100 mmol/L (ref 98–111)
Creatinine, Ser: 0.7 mg/dL (ref 0.44–1.00)
Glucose, Bld: 121 mg/dL — ABNORMAL HIGH (ref 70–99)
POTASSIUM: 4.1 mmol/L (ref 3.5–5.1)
Sodium: 137 mmol/L (ref 135–145)
TOTAL PROTEIN: 7.6 g/dL (ref 6.5–8.1)

## 2018-08-05 LAB — URINALYSIS, ROUTINE W REFLEX MICROSCOPIC
BILIRUBIN URINE: NEGATIVE
Glucose, UA: NEGATIVE mg/dL
Hgb urine dipstick: NEGATIVE
KETONES UR: NEGATIVE mg/dL
LEUKOCYTES UA: NEGATIVE
NITRITE: NEGATIVE
PROTEIN: NEGATIVE mg/dL
Specific Gravity, Urine: 1.025 (ref 1.005–1.030)
pH: 5.5 (ref 5.0–8.0)

## 2018-08-05 LAB — CBC WITH DIFFERENTIAL/PLATELET
Abs Immature Granulocytes: 0.03 10*3/uL (ref 0.00–0.07)
BASOS ABS: 0 10*3/uL (ref 0.0–0.1)
Basophils Relative: 0 %
EOS ABS: 0.2 10*3/uL (ref 0.0–0.5)
Eosinophils Relative: 3 %
HEMATOCRIT: 35.7 % — AB (ref 36.0–46.0)
HEMOGLOBIN: 10.3 g/dL — AB (ref 12.0–15.0)
Immature Granulocytes: 0 %
LYMPHS ABS: 1.5 10*3/uL (ref 0.7–4.0)
LYMPHS PCT: 21 %
MCH: 23.1 pg — AB (ref 26.0–34.0)
MCHC: 28.9 g/dL — AB (ref 30.0–36.0)
MCV: 80 fL (ref 80.0–100.0)
MONO ABS: 0.6 10*3/uL (ref 0.1–1.0)
Monocytes Relative: 8 %
NRBC: 0 % (ref 0.0–0.2)
Neutro Abs: 4.7 10*3/uL (ref 1.7–7.7)
Neutrophils Relative %: 68 %
Platelets: 271 10*3/uL (ref 150–400)
RBC: 4.46 MIL/uL (ref 3.87–5.11)
RDW: 16 % — AB (ref 11.5–15.5)
WBC: 7.1 10*3/uL (ref 4.0–10.5)

## 2018-08-05 LAB — PREGNANCY, URINE: Preg Test, Ur: NEGATIVE

## 2018-08-05 LAB — LIPASE, BLOOD: LIPASE: 28 U/L (ref 11–51)

## 2018-08-05 MED ORDER — IPRATROPIUM-ALBUTEROL 0.5-2.5 (3) MG/3ML IN SOLN
3.0000 mL | Freq: Four times a day (QID) | RESPIRATORY_TRACT | Status: DC
  Administered 2018-08-05: 3 mL via RESPIRATORY_TRACT
  Filled 2018-08-05: qty 3

## 2018-08-05 MED ORDER — DOXYCYCLINE HYCLATE 100 MG PO CAPS: 100.0000 mg | ORAL_CAPSULE | Freq: Two times a day (BID) | ORAL | 0 refills | Status: AC

## 2018-08-05 MED ORDER — SODIUM CHLORIDE 0.9 % IV BOLUS
500.0000 mL | Freq: Once | INTRAVENOUS | Status: AC
  Administered 2018-08-05: 500 mL via INTRAVENOUS

## 2018-08-05 MED ORDER — ALBUTEROL SULFATE HFA 108 (90 BASE) MCG/ACT IN AERS
2.0000 | INHALATION_SPRAY | Freq: Once | RESPIRATORY_TRACT | Status: AC
  Administered 2018-08-05: 2 via RESPIRATORY_TRACT
  Filled 2018-08-05: qty 6.7

## 2018-08-05 NOTE — ED Triage Notes (Signed)
Saw pmd 4 days ago and diagnosed with bronchitis, per pt.  Is taking PCN and cough med with codeine.  Sts she is no better.  Productive cough.

## 2018-08-05 NOTE — ED Notes (Signed)
Graham crackers and ice water given for po challenge.

## 2018-08-05 NOTE — ED Provider Notes (Signed)
[facility] EMERGENCY DEPARTMENT Provider Note   CSN: [id number] Arrival date & time: 08/05/18  0700     History   Chief Complaint Chief Complaint  Patient presents with  . Shortness of Breath    HPI Sandra [name] is a 27 y.o. female.  The history is provided by the patient and medical records. No language interpreter was used.  Cough  This is a new problem. The current episode started more than 2 days ago. The problem occurs constantly. The problem has been gradually worsening. The cough is productive of sputum. There has been no fever. Associated symptoms include rhinorrhea, shortness of breath and wheezing. Pertinent negatives include no chest pain, no chills, no headaches and no sore throat. She has tried nothing for the symptoms. The treatment provided no relief. She is not a smoker. Her past medical history is significant for bronchitis.    Past Medical History:  Diagnosis Date  . Anemia   . Diabetes mellitus without complication (HCC)   . GERD (gastroesophageal reflux disease)   . Hemorrhage after delivery of fetus    Intraoperative, received blood transfusion  . History of blood transfusion   . Hx of ectopic pregnancy     Patient Active Problem List   Diagnosis Date Noted  . Acute viral bronchitis 08/02/2018  . Diabetes mellitus without complication, without long-term current use of insulin (HCC) 05/26/2016  . Anxiety state 04/28/2016  . GERD (gastroesophageal reflux disease) 04/28/2016  . Obesity 01/13/2014  . Halitosis 01/13/2014  . Implanon in place 01/13/2014  . Panic attacks 01/13/2014  . Health care maintenance 01/13/2014    Past Surgical History:  Procedure Laterality Date  . CESAREAN SECTION    . CESAREAN SECTION  04/23/2012   Procedure: CESAREAN SECTION;  Surgeon: Reva Bores, MD;  Location: [facility];  Service: Gynecology;  Laterality: N/A;  . CHOLECYSTECTOMY  10/23/2012   Procedure: LAPAROSCOPIC CHOLECYSTECTOMY WITH INTRAOPERATIVE  CHOLANGIOGRAM;  Surgeon: Valarie Merino, MD;  Location: [facility];  Service: General;  Laterality: N/A;  . LAPAROSCOPY FOR ECTOPIC PREGNANCY       OB History    Gravida  4   Para  2   Term  2   Preterm      AB  1   Living  2     SAB      TAB      Ectopic  1   Multiple      Live Births  2            Home Medications    Prior to Admission medications   Medication Sig Start Date End Date Taking? Authorizing Provider  cyanocobalamin 500 MCG tablet Take 500 mcg by mouth daily.    [provider]  ferrous sulfate 325 (65 FE) MG tablet Take 1 tablet (325 mg total) by mouth every other day. 06/28/18   Garth Bigness, MD  fluticasone (FLONASE) 50 MCG/ACT nasal spray Place 1 spray into both nostrils daily. 06/25/18   Garth Bigness, MD  glucose blood (ACCU-CHEK GUIDE) test strip Use as instructed 06/11/17   Renne Musca, MD  guaiFENesin-codeine 100-10 MG/5ML syrup Take 5 mLs by mouth every 6 (six) hours as needed for cough. 08/01/18   McDiarmid, Leighton Roach, MD  metFORMIN (GLUCOPHAGE) 1000 MG tablet Take 1 tablet (1,000 mg total) by mouth 2 (two) times daily with a meal. 06/25/18   Garth Bigness, MD  Multiple Vitamin (MULTIVITAMIN WITH MINERALS) TABS tablet Take 1 tablet by  mouth daily.    [provider]  omeprazole (PRILOSEC) 40 MG capsule Take 1 capsule (40 mg total) by mouth daily. 05/30/18   Wendee BeaversMcMullen, David J, DO  penicillin v potassium (VEETID) 500 MG tablet Take 1 tablet (500 mg total) by mouth 3 (three) times daily for 10 days. 08/02/18 08/12/18  McDiarmid, Leighton Roachodd D, MD  Probiotic Product (PROBIOTIC-10 PO) Take by mouth.    [provider]    Family History Family History  Problem Relation Age of Onset  . High blood pressure Mother   . Aneurysm Mother   . Hypertension Mother   . Drug abuse Mother   . Diabetes Mellitus II Father   . Hyperlipidemia Father   . Alcohol abuse Father   . Heart disease Maternal Aunt   . Diabetes  Paternal Grandmother   . Diabetes Paternal Grandfather   . Other Neg Hx     Social History Social History   Tobacco Use  . Smoking status: Former Games developermoker  . Smokeless tobacco: Never Used  Substance Use Topics  . Alcohol use: Yes    Comment: occasionally  . Drug use: No     Allergies   Shellfish allergy   Review of Systems Review of Systems  Constitutional: Positive for fatigue. Negative for chills, diaphoresis and fever.  HENT: Positive for congestion and rhinorrhea. Negative for sore throat.   Respiratory: Positive for cough, chest tightness, shortness of breath and wheezing. Negative for choking and stridor.   Cardiovascular: Negative for chest pain, palpitations and leg swelling.  Gastrointestinal: Positive for diarrhea and nausea. Negative for abdominal pain, constipation and vomiting.  Genitourinary: Negative for dysuria, flank pain and frequency.  Musculoskeletal: Negative for back pain, neck pain and neck stiffness.  Skin: Negative for rash and wound.  Neurological: Negative for light-headedness, numbness and headaches.  Psychiatric/Behavioral: Negative for agitation.  All other systems reviewed and are negative.    Physical Exam Updated Vital Signs BP 111/87 (BP Location: Right Arm)   Pulse 96   Temp 98.8 F (37.1 C) (Oral)   Resp 20   Ht 4\' 11"  (1.499 m)   Wt 105 kg   LMP 07/25/2018 (Approximate)   SpO2 98%   BMI 46.75 kg/m   Physical Exam  Constitutional: She appears well-developed and well-nourished.  Non-toxic appearance. She does not appear ill. No distress.  HENT:  Head: Normocephalic and atraumatic.  Nose: Rhinorrhea present.  Mouth/Throat: No oropharyngeal exudate or posterior oropharyngeal edema.  Eyes: Pupils are equal, round, and reactive to light. Conjunctivae are normal.  Neck: Normal range of motion. Neck supple.  Cardiovascular: Normal rate and regular rhythm.  No murmur heard. Pulmonary/Chest: Effort normal. No respiratory distress.  She has wheezes. She has no rhonchi.  Abdominal: Soft. She exhibits no distension. There is no tenderness.  Musculoskeletal: She exhibits no edema.       Right lower leg: She exhibits no tenderness and no edema.       Left lower leg: She exhibits no tenderness and no edema.  Lymphadenopathy:    She has no cervical adenopathy.  Neurological: She is alert.  Skin: Skin is warm and dry.  Psychiatric: She has a normal mood and affect.  Nursing note and vitals reviewed.    ED Treatments / Results  Labs (all labs ordered are listed, but only abnormal results are displayed) Labs Reviewed  CBC WITH DIFFERENTIAL/PLATELET - Abnormal; Notable for the following components:      Result Value   Hemoglobin 10.3 (*)  HCT 35.7 (*)    MCH 23.1 (*)    MCHC 28.9 (*)    RDW 16.0 (*)    All other components within normal limits  COMPREHENSIVE METABOLIC PANEL - Abnormal; Notable for the following components:   Glucose, Bld 121 (*)    Total Bilirubin 0.1 (*)    All other components within normal limits  LIPASE, BLOOD  URINALYSIS, ROUTINE W REFLEX MICROSCOPIC  PREGNANCY, URINE    EKG EKG Interpretation  Date/Time:  Monday August 05 2018 07:43:01 EST Ventricular Rate:  97 PR Interval:    QRS Duration: 71 QT Interval:  322 QTC Calculation: 409 R Axis:   81 Text Interpretation:  Sinus rhythm Biatrial enlargement Baseline wander in lead(s) V6 When compred to prior, no significant changes seen.  No STEMI Confirmed by Theda Belfast ([id number]) on 08/05/2018 8:19:49 AM   Radiology Dg Chest 2 View  Result Date: 08/05/2018 CLINICAL DATA:  Cough and wheezing EXAM: CHEST - 2 VIEW COMPARISON:  August 10, 2013 FINDINGS: There is airspace consolidation in the lingula consistent with pneumonia. Lungs elsewhere are clear. Heart size and pulmonary vascularity are normal. No adenopathy. No bone lesions. IMPRESSION: Lingular consolidation consistent with pneumonia. Lungs elsewhere are clear. Electronically  Signed   By: Bretta Bang III M.D.   On: 08/05/2018 07:44    Procedures Procedures (including critical care time)  Medications Ordered in ED Medications  ipratropium-albuterol (DUONEB) 0.5-2.5 (3) MG/3ML nebulizer solution 3 mL (3 mLs Nebulization Given 08/05/18 0731)  albuterol (PROVENTIL HFA;VENTOLIN HFA) 108 (90 Base) MCG/ACT inhaler 2 puff (has no administration in time range)  sodium chloride 0.9 % bolus 500 mL ( Intravenous Stopped 08/05/18 0840)     Initial Impression / Assessment and Plan / ED Course  I have reviewed the triage vital signs and the nursing notes.  Pertinent labs & imaging results that were available during my care of the patient were reviewed by me and considered in my medical decision making (see chart for details).     Blessen Kimbrough is a 27 y.o. female with a past medical history significant for GERD, diabetes, and recent diagnosis of bronchitis currently on penicillin who presents for worsening shortness of breath, cough, malaise, diarrhea, and nausea.  Patient reports that she was diagnosed 5 days ago with bronchitis and started on penicillin.  She reports her cough is persisted and worsened.  She is feeling more short of breath when she is having coughing fits.  She denies chest pain or palpitations.  She reports some nausea but no vomiting.  She reports some diarrhea.  She denies significant urinary symptoms.  She reports feeling general malaise and fatigue.  She has not taken her temperature at home or taking her glucose at home.  She denies other complaints on arrival.  She denies any significant vision changes, neurologic deficits, neck pain, or neck stiffness.  On exam, patient has wheezing in the lungs.  Patient denies history of prior asthma or COPD.  Patient has no murmur.  Patient's legs are nonedematous and has symmetric pulses in her upper extremities bilaterally.  Chest and back are nontender.  Abdomen nontender.  Patient has some congestion seen  on oropharyngeal exam.  Given the new wheezing, this may be due to patient's bronchitis or other upper respiratory infection in the setting of cold temperature changes in the environment.  Patient will be given a DuoNeb treatment.  She will also chest x-ray to look for development of pneumonia.  Patient has some screening  laboratory testing given the diarrhea and fatigue to look for dehydration or other electrolyte imbalance.  Anticipate reassessment.  Patient is breathing better and work was reassuring, anticipate discharge with an inhaler for outpatient PCP follow-up.  8:50 AM Patient's laboratory testing was reassuring however her x-ray confirms new pneumonia.  As patient felt his pneumonia on penicillin, this we can discontinue.  She will be started on doxycycline for treatment of community associated pneumonia.  Patient has reassuring vital signs and is keeping her oxygen saturations above 95% on room air.  Do not feel she needs admission for CAP.  Next  Given her improvement in breathing after the albuterol, she will be given an albuterol inhaler.  Patient will follow-up with her PCP in several days and understood return precautions.  Patient had no other questions or concerns and was discharged in good condition for outpatient management.  Final Clinical Impressions(s) / ED Diagnoses   Final diagnoses:  Wheeze  Community acquired pneumonia of left lung, unspecified part of lung  Cough    ED Discharge Orders         Ordered    doxycycline (VIBRAMYCIN) 100 MG capsule  2 times daily     08/05/18 0848          Clinical Impression: 1. Wheeze   2. Community acquired pneumonia of left lung, unspecified part of lung   3. Cough     Disposition: Discharge  Condition: Good  I have discussed the results, Dx and Tx plan with the pt(& family if present). He/she/they expressed understanding and agree(s) with the plan. Discharge instructions discussed at great length. Strict return  precautions discussed and pt &/or family have verbalized understanding of the instructions. No further questions at time of discharge.    New Prescriptions   DOXYCYCLINE (VIBRAMYCIN) 100 MG CAPSULE    Take 1 capsule (100 mg total) by mouth 2 (two) times daily for 7 days.    Follow Up: [facility] [address] [id number] [address] [phone]    Your PCP        Yaden Seith, Canary Brim, MD 08/05/18 [phone]

## 2018-08-05 NOTE — ED Notes (Signed)
ED Provider at bedside. 

## 2018-08-05 NOTE — Discharge Instructions (Signed)
Your x-ray today confirm development of pneumonia.  As the penicillin was not treating her symptoms quickly, please stop taking it and we will start her on doxycycline.  Please take this antibiotic twice a day for the next week.  We also saw improvement after breathing treatment, please use the inhaler every 4-6 hours as needed to help with breathing.  Please follow-up with your primary doctor in several days and please rest and stay hydrated.  If any symptoms change or worsen, please return to the nearest emergency department.

## 2018-08-05 NOTE — ED Notes (Signed)
ED Provider at bedside discussing test results and dispo plan of care. 

## 2018-08-24 ENCOUNTER — Emergency Department (HOSPITAL_BASED_OUTPATIENT_CLINIC_OR_DEPARTMENT_OTHER): Payer: Medicaid Other

## 2018-08-24 ENCOUNTER — Emergency Department (HOSPITAL_BASED_OUTPATIENT_CLINIC_OR_DEPARTMENT_OTHER)
Admission: EM | Admit: 2018-08-24 | Discharge: 2018-08-24 | Disposition: A | Discharge status: Home / Self Care | Payer: Medicaid Other | Attending: Emergency Medicine | Admitting: Emergency Medicine

## 2018-08-24 DIAGNOSIS — Z87891 Personal history of nicotine dependence: Secondary | ICD-10-CM | POA: Insufficient documentation

## 2018-08-24 DIAGNOSIS — E119 Type 2 diabetes mellitus without complications: Secondary | ICD-10-CM | POA: Diagnosis not present

## 2018-08-24 DIAGNOSIS — R059 Cough, unspecified: Secondary | ICD-10-CM

## 2018-08-24 DIAGNOSIS — Z79899 Other long term (current) drug therapy: Secondary | ICD-10-CM | POA: Diagnosis not present

## 2018-08-24 DIAGNOSIS — R05 Cough: Secondary | ICD-10-CM | POA: Diagnosis present

## 2018-08-24 MED ORDER — PREDNISONE 20 MG PO TABS: 40.0000 mg | ORAL_TABLET | Freq: Every day | ORAL | 0 refills | Status: DC

## 2018-08-24 MED ORDER — ALBUTEROL SULFATE HFA 108 (90 BASE) MCG/ACT IN AERS
2.0000 | INHALATION_SPRAY | Freq: Once | RESPIRATORY_TRACT | Status: AC
  Administered 2018-08-24: 2 via RESPIRATORY_TRACT
  Filled 2018-08-24: qty 6.7

## 2018-08-24 MED ORDER — PREDNISONE 50 MG PO TABS
60.0000 mg | ORAL_TABLET | Freq: Once | ORAL | Status: AC
  Administered 2018-08-24: 60 mg via ORAL
  Filled 2018-08-24: qty 1

## 2018-08-24 NOTE — ED Provider Notes (Signed)
[facility] EMERGENCY DEPARTMENT Provider Note   CSN: [id number] Arrival date & time: 08/24/18  0035     History   Chief Complaint Chief Complaint  Patient presents with  . Cough    HPI Sandra [name] is a 27 y.o. female.  HPI  This is a 27 year old female who presents with cough.  Recent diagnosis of pneumonia.  Patient reports that she finished a course of antibiotics.  Chart review reveals that she was given doxycycline on November 18.  Patient reports that since finishing antibiotic she continues to have cough.  Cough is nonproductive.  She does report some posttussive emesis.  She is no longer a smoker.  She uses her inhaler twice a day with some relief.  She denies any persistent fevers.  Denies congestion or rhinorrhea.  She is concerned that the antibiotic did not work.  Past Medical History:  Diagnosis Date  . Anemia   . Diabetes mellitus without complication (HCC)   . GERD (gastroesophageal reflux disease)   . Hemorrhage after delivery of fetus    Intraoperative, received blood transfusion  . History of blood transfusion   . Hx of ectopic pregnancy     Patient Active Problem List   Diagnosis Date Noted  . Acute viral bronchitis 08/02/2018  . Diabetes mellitus without complication, without long-term current use of insulin (HCC) 05/26/2016  . Anxiety state 04/28/2016  . GERD (gastroesophageal reflux disease) 04/28/2016  . Obesity 01/13/2014  . Halitosis 01/13/2014  . Implanon in place 01/13/2014  . Panic attacks 01/13/2014  . Health care maintenance 01/13/2014    Past Surgical History:  Procedure Laterality Date  . CESAREAN SECTION    . CESAREAN SECTION  04/23/2012   Procedure: CESAREAN SECTION;  Surgeon: Reva Bores, MD;  Location: [facility];  Service: Gynecology;  Laterality: N/A;  . CHOLECYSTECTOMY  10/23/2012   Procedure: LAPAROSCOPIC CHOLECYSTECTOMY WITH INTRAOPERATIVE CHOLANGIOGRAM;  Surgeon: Valarie Merino, MD;  Location: [facility];  Service:  General;  Laterality: N/A;  . LAPAROSCOPY FOR ECTOPIC PREGNANCY       OB History    Gravida  4   Para  2   Term  2   Preterm      AB  1   Living  2     SAB      TAB      Ectopic  1   Multiple      Live Births  2            Home Medications    Prior to Admission medications   Medication Sig Start Date End Date Taking? Authorizing Provider  cyanocobalamin 500 MCG tablet Take 500 mcg by mouth daily.    [provider]  ferrous sulfate 325 (65 FE) MG tablet Take 1 tablet (325 mg total) by mouth every other day. 06/28/18   Garth Bigness, MD  fluticasone (FLONASE) 50 MCG/ACT nasal spray Place 1 spray into both nostrils daily. 06/25/18   Garth Bigness, MD  glucose blood (ACCU-CHEK GUIDE) test strip Use as instructed 06/11/17   Renne Musca, MD  guaiFENesin-codeine 100-10 MG/5ML syrup Take 5 mLs by mouth every 6 (six) hours as needed for cough. 08/01/18   McDiarmid, Leighton Roach, MD  metFORMIN (GLUCOPHAGE) 1000 MG tablet Take 1 tablet (1,000 mg total) by mouth 2 (two) times daily with a meal. 06/25/18   Garth Bigness, MD  Multiple Vitamin (MULTIVITAMIN WITH MINERALS) TABS tablet Take 1 tablet by mouth daily.  [provider]  omeprazole (PRILOSEC) 40 MG capsule Take 1 capsule (40 mg total) by mouth daily. 05/30/18   Wendee Beavers, DO  Probiotic Product (PROBIOTIC-10 PO) Take by mouth.    [provider]    Family History Family History  Problem Relation Age of Onset  . High blood pressure Mother   . Aneurysm Mother   . Hypertension Mother   . Drug abuse Mother   . Diabetes Mellitus II Father   . Hyperlipidemia Father   . Alcohol abuse Father   . Heart disease Maternal Aunt   . Diabetes Paternal Grandmother   . Diabetes Paternal Grandfather   . Other Neg Hx     Social History Social History   Tobacco Use  . Smoking status: Former Games developer  . Smokeless tobacco: Never Used  Substance Use Topics  . Alcohol use: Yes     Comment: occasionally  . Drug use: No     Allergies   Shellfish allergy   Review of Systems Review of Systems  Constitutional: Negative for fever.  HENT: Negative for congestion and rhinorrhea.   Respiratory: Positive for cough, shortness of breath and wheezing.   Cardiovascular: Negative for chest pain.  Gastrointestinal: Positive for vomiting. Negative for abdominal pain and nausea.  All other systems reviewed and are negative.    Physical Exam Updated Vital Signs BP (!) 146/90 (BP Location: Left Arm)   Pulse 88   Temp 99.7 F (37.6 C) (Oral)   Resp 20   Ht 1.499 m (4\' 11" )   Wt 105 kg   LMP 07/25/2018 (Approximate)   SpO2 100%   BMI 46.75 kg/m   Physical Exam  Constitutional: She is oriented to person, place, and time. She appears well-developed and well-nourished.  Obese, no acute distress  HENT:  Head: Normocephalic and atraumatic.  Neck: Neck supple.  Cardiovascular: Normal rate, regular rhythm and normal heart sounds.  Pulmonary/Chest: Effort normal. No respiratory distress. She has wheezes.  Mild diffuse expiratory wheezing with good air movement, no respiratory distress  Abdominal: Soft. There is no tenderness.  Musculoskeletal: She exhibits no edema.  Neurological: She is alert and oriented to person, place, and time.  Skin: Skin is warm and dry.  Psychiatric: She has a normal mood and affect.  Nursing note and vitals reviewed.    ED Treatments / Results  Labs (all labs ordered are listed, but only abnormal results are displayed) Labs Reviewed - No data to display  EKG None  Radiology Dg Chest 2 View  Result Date: 08/24/2018 CLINICAL DATA:  27 year old female with cough. EXAM: CHEST - 2 VIEW COMPARISON:  Chest radiograph dated 08/05/2018 FINDINGS: The heart size and mediastinal contours are within normal limits. Both lungs are clear. The visualized skeletal structures are unremarkable. IMPRESSION: No active cardiopulmonary disease.  Electronically Signed   By: Elgie Collard M.D.   On: 08/24/2018 01:47    Procedures Procedures (including critical care time)  Medications Ordered in ED Medications  albuterol (PROVENTIL HFA;VENTOLIN HFA) 108 (90 Base) MCG/ACT inhaler 2 puff (has no administration in time range)  predniSONE (DELTASONE) tablet 60 mg (has no administration in time range)     Initial Impression / Assessment and Plan / ED Course  I have reviewed the triage vital signs and the nursing notes.  Pertinent labs & imaging results that were available during my care of the patient were reviewed by me and considered in my medical decision making (see chart for details).  Patient presents with persistent cough after being diagnosed with pneumonia approximately 3 weeks ago.  She is overall nontoxic-appearing vital signs are reassuring she is satting 100% on room air.  She is afebrile.  She has some wheezing on exam.  Chest x-ray shows no persistent infiltrate.  Suspect this is likely bronchitis with persistent cough related to pneumonia.  I discussed with the patient that she may have a cough for 4 to 6 weeks.  Given wheezing, she can increase her inhaler usage to every 4 hours.  She will also be given steroids at discharge for inflammation.  After history, exam, and medical workup I feel the patient has been appropriately medically screened and is safe for discharge home. Pertinent diagnoses were discussed with the patient. Patient was given return precautions.  Final Clinical Impressions(s) / ED Diagnoses   Final diagnoses:  Cough    ED Discharge Orders    None       Horton, Mayer Maskerourtney F, MD 08/24/18 [id number]

## 2018-08-24 NOTE — ED Triage Notes (Signed)
Pt reports dx with PNA several weeks ago.  Cough continues. Cough noted in triage.

## 2018-08-24 NOTE — ED Notes (Signed)
Pt understood dc material. NAD noted. Script given at dc  

## 2018-08-24 NOTE — Discharge Instructions (Addendum)
Were seen today for persistent cough.  This is likely related to recent pneumonia.  Sometimes it can take 4 to 6 weeks for cough to improve.  Given your wheezing, you were given steroids.  Continue your inhaler as needed every 4 hours.

## 2018-08-28 ENCOUNTER — Encounter: Payer: Self-pay | Admitting: Family Medicine

## 2018-08-28 ENCOUNTER — Other Ambulatory Visit: Payer: Self-pay

## 2018-08-28 ENCOUNTER — Ambulatory Visit: Payer: Medicaid Other | Admitting: Family Medicine

## 2018-08-28 VITALS — BP 116/72 | HR 94 | Temp 98.3°F | Ht 59.0 in | Wt 231.0 lb

## 2018-08-28 DIAGNOSIS — R05 Cough: Secondary | ICD-10-CM

## 2018-08-28 DIAGNOSIS — R059 Cough, unspecified: Secondary | ICD-10-CM

## 2018-08-28 DIAGNOSIS — E119 Type 2 diabetes mellitus without complications: Secondary | ICD-10-CM

## 2018-08-28 DIAGNOSIS — R062 Wheezing: Secondary | ICD-10-CM | POA: Insufficient documentation

## 2018-08-28 MED ORDER — ACCU-CHEK AVIVA DEVI: 0 refills | Status: DC

## 2018-08-28 NOTE — Patient Instructions (Signed)
It was a pleasure to see you today! Thank you for choosing [facility] for your primary care. Sandra [name] was seen for cough, diabetes.   Our plans for today were:  They should call you with the diabetic education appointment.   Your cough is still from the viral infection, but should continue to improve over the next 2 weeks.   You can schedule an appt up front with Dr. Raymondo BandKoval for the lung test.   Keep taking your metformin, don't worry about insulin yet. We will talk about more options at the next visit, but there are pills available.   Best,  Dr. Chanetta Marshallimberlake

## 2018-08-28 NOTE — Progress Notes (Deleted)
   CC: ***  HPI  ROS: ***Denies CP, SOB, abdominal pain, dysuria, changes in BMs.   CC, SH/smoking status, and VS noted  Objective: BP 116/72   Pulse 94   Temp 98.3 F (36.8 C) (Oral)   Ht 4\' 11"  (1.499 m)   Wt 231 lb (104.8 kg)   SpO2 99%   BMI 46.66 kg/m  Gen: NAD, alert, cooperative, and pleasant.*** HEENT: NCAT, EOMI, PERRL CV: RRR, no murmur Resp: CTAB, no wheezes, non-labored Abd: SNTND, BS present, no guarding or organomegaly Ext: No edema, warm Neuro: Alert and oriented, Speech clear, No gross deficits  Assessment and plan:  No problem-specific Assessment & Plan notes found for this encounter.   No orders of the defined types were placed in this encounter.   No orders of the defined types were placed in this encounter.   Health Maintenance reviewed - {health maintenance:[id number]}.  Loni MuseKate Lakevia Perris, MD, PGY3 08/28/2018 1:41 PM

## 2018-08-28 NOTE — Progress Notes (Signed)
   CC: cough, worried about DM   HPI  Cough - Still having cough, ED increased inhaler to q4 as they noted some wheezing at her visit 4 days ago.  They also discharged her with p.o. steroids.  Today is the last day of steroids. She was worried about CBGs. Cough is getting better.  She feels that the cough is worse with exertion, but is overall about 50% better.  DM - has lost more weight. Having a hard time with cravings.  She is very worried today about having to go on insulin.  She states her father passed away from complications of diabetes and that he did not follow doctor's recommendations.  Unfortunately, her CBG meter recently broke.  Wheezing: Noted by ED provider at recent visit.  Patient states she has wheezed in the past with URIs. No formal asthma diagnoses.   ROS: Denies CP, SOB, abdominal pain, dysuria, changes in BMs.   CC, SH/smoking status, and VS noted  Objective: BP 116/72   Pulse 94   Temp 98.3 F (36.8 C) (Oral)   Ht 4\' 11"  (1.499 m)   Wt 231 lb (104.8 kg)   SpO2 99%   BMI 46.66 kg/m  Gen: NAD, alert, cooperative, and pleasant. HEENT: NCAT, EOMI, PERRL CV: RRR, no murmur Resp: CTAB, no wheezes, non-labored  Ext: No edema, warm Neuro: Alert and oriented, Speech clear, No gross deficits  Assessment and plan:  Diabetes mellitus without complication, without long-term current use of insulin (HCC) sent prescription for new meter today. patient is very worried about needing insulin, reassured her that this is not currently a problem and we likely have additional oral options before starting insulin  Post viral cough - normal WOB and no wheezing on exam. Continue monitoring and recheck if persistent in 2 weeks.   Wheezing - once this acute illness has completely resolved, would like to get PFTs given patient's reported history of wheezing with multiple URIs. Referred to Dr. Raymondo BandKoval for PFTs.   Orders Placed This Encounter  Procedures  . Ambulatory referral to  diabetic education    Referral Priority:   Routine    Referral Type:   Consultation    Referral Reason:   Specialty Services Required    Number of Visits Requested:   1    Meds ordered this encounter  Medications  . Blood Glucose Monitoring Suppl (ACCU-CHEK AVIVA) device    Sig: Use as instructed    Dispense:  1 each    Refill:  0    Loni MuseKate Timberlake, MD, PGY3 08/28/2018 3:26 PM

## 2018-08-28 NOTE — Assessment & Plan Note (Signed)
sent prescription for new meter today. patient is very worried about needing insulin, reassured her that this is not currently a problem and we likely have additional oral options before starting insulin

## 2018-09-17 ENCOUNTER — Encounter: Payer: Medicaid Other | Attending: Pediatrics | Admitting: Registered"

## 2018-09-17 ENCOUNTER — Encounter: Payer: Self-pay | Admitting: Registered"

## 2018-09-17 DIAGNOSIS — E119 Type 2 diabetes mellitus without complications: Secondary | ICD-10-CM | POA: Insufficient documentation

## 2018-09-17 NOTE — Patient Instructions (Addendum)
Take your iron with vitamin C Aim to eat balanced meals and snacks Review the medications list and information before your next MD visit Consider sleep tips to help with more energy Emergen-C might be a drop-in for water may help with a little energy boost

## 2018-09-17 NOTE — Progress Notes (Signed)
Diabetes Self-Management Education  Visit Type: First/Initial  Appt. Start Time: 1120 Appt. End Time: 1220  09/17/2018  Sandra [name], identified by name and date of birth, is a 27 y.o. female with a diagnosis of Diabetes: Type 2.   ASSESSMENT Pt states she has been dieting and trying to restrict her sweet intake but is having a difficult time. Pt states her energy level is "on the fritz." Pt reports she will have more blood work next MD visit to test for anemia. Pt states she takes iron supplement as directed every other day and doesn't take with calcium. Pt states she was not aware of vitamin C helping with absorption. Pt stated her co-worker started [employer] and has seen good results in BG control and she was interested in what other medications are available to her. Pt states her doctor will talk to her about medications at her next appointment when she has updated blood work.  Pt states her 556 yr old daughter is very conscious of healthy, balanced meals and the importance of vegetables and reminds her to include them with their meals.  Pt states her A1c was 10% at diagnosis in 2017, since then it has stayed pretty close to 8%. Pt states she initially tried a vegan diet but stopped because it was too much work and always focused on food, what she can and can't eat.  Sleep: deals with insomnia. Works 3rd shift for at least another 6-8 weeks, when home has young children to take care of and hard to get enough sleep.  Diabetes Self-Management Education - 09/17/18 1130      Visit Information   Visit Type  First/Initial      Initial Visit   Diabetes Type  Type 2    Are you currently following a meal plan?  No    Are you taking your medications as prescribed?  Yes    Date Diagnosed  2017      Health Coping   How would you rate your overall health?  Fair      Psychosocial Assessment   Patient Belief/Attitude about Diabetes  Afraid    How often do you need to have someone help you  when you read instructions, pamphlets, or other written materials from your doctor or pharmacy?  1 - Never    What is the last grade level you completed in school?  10      Complications   Last HgB A1C per patient/outside source  7.9 %    How often do you check your blood sugar?  0 times/day (not testing)   meter broken   Have you had a dilated eye exam in the past 12 months?  No    Have you had a dental exam in the past 12 months?  Yes    Are you checking your feet?  Yes    How many days per week are you checking your feet?  1      Dietary Intake   Breakfast  steel cut oats OR Mal[place]urkey sausage, eggs, multi-grain toast OR smoothie, kefir, spinach, berries, flaxseeds/chia seeds    Snack (morning)  none    Lunch  grilled cheese, OR chicken sandwich OR left overs    Snack (afternoon)  none    Dinner  baked chicken, rice, mixed veggies OR grilled cheese     Snack (evening)  chips, cake    Beverage(s)  water, wine occassionally       Exercise   Exercise Type  ADL's    How many days per week to you exercise?  0    How many minutes per day do you exercise?  0    Total minutes per week of exercise  0      Patient Education   Previous Diabetes Education  Yes (please comment)   2018   Nutrition management   Role of diet in the treatment of diabetes and the relationship between the three main macronutrients and blood glucose level;Effects of alcohol on blood glucose and safety factors with consumption of alcohol.    Physical activity and exercise   Role of exercise on diabetes management, blood pressure control and cardiac health.    Medications  Reviewed patients medication for diabetes, action, purpose, timing of dose and side effects.    Psychosocial adjustment  Role of stress on diabetes    Personal strategies to promote health  Other (comment)      Individualized Goals (developed by patient)   Nutrition  General guidelines for healthy choices and portions discussed      Outcomes    Expected Outcomes  Demonstrated interest in learning. Expect positive outcomes    Future DMSE  PRN    Program Status  Completed      Individualized Plan for Diabetes Self-Management Training:   Learning Objective:  Patient will have a greater understanding of diabetes self-management. Patient education plan is to attend individual and/or group sessions per assessed needs and concerns.  Patient Instructions  Take your iron with vitamin C Aim to eat balanced meals and snacks Review the medications list and information before your next MD visit Consider sleep tips to help with more energy Emergen-C might be a drop-in for water may help with a little energy boost  Expected Outcomes:  Demonstrated interest in learning. Expect positive outcomes  Education material provided: ADA Diabetes: Your Take Control Guide and My Plate, medications list  If problems or questions, patient to contact team via:  Phone  Future DSME appointment: PRN

## 2018-09-19 ENCOUNTER — Other Ambulatory Visit: Payer: Self-pay

## 2018-09-19 MED ORDER — ACCU-CHEK GUIDE ME W/DEVICE KIT: 1.0000 | PACK | Freq: Every day | 0 refills | Status: DC

## 2018-09-19 MED ORDER — ACCU-CHEK GUIDE W/DEVICE KIT: 1.0000 | PACK | Freq: Every day | 0 refills | Status: DC

## 2018-09-19 MED ORDER — GLUCOSE BLOOD VI STRP: ORAL_STRIP | 12 refills | Status: AC

## 2018-09-19 NOTE — Telephone Encounter (Signed)
Meter re-sent to try to meet Medicaid requirements. Ples Specter, RN [facility] RN)

## 2018-09-19 NOTE — Telephone Encounter (Signed)
Patient left message that her insurance is requiring an Accu-Chek Guide meter rather than Aviva. Will re-send. Ples SpecterAlisa Aries Kasa, RN [facility] RN)

## 2018-10-05 ENCOUNTER — Other Ambulatory Visit: Payer: Self-pay | Admitting: Family Medicine

## 2018-10-05 DIAGNOSIS — K219 Gastro-esophageal reflux disease without esophagitis: Secondary | ICD-10-CM

## 2018-10-28 ENCOUNTER — Telehealth: Payer: Self-pay | Admitting: *Deleted

## 2018-10-28 NOTE — Telephone Encounter (Signed)
Received fax that meter requires PA.  Similar situation with different patient.  Medicaid advised of the following: Was given the BIN, PCN, Group, and ID number for free meter BIN. They all match what is on the preferred list except ID number given was [id number]. Was advised to have pharmacy call pharmacy help desk at [phone] if still having problems running.  Pharmacist informed.  She will call back with issues.  Elek Holderness, Maryjo Rochester, CMA

## 2019-02-06 IMAGING — CT CT RENAL STONE PROTOCOL
2 of 4 series · 16 of 46 positions shown, 18 images · non-contrast
Comparison: None.

CLINICAL DATA: 27-year-old female left flank pain for 2 days. Prior
cholecystectomy. Initial encounter.

EXAM:
CT ABDOMEN AND PELVIS WITHOUT CONTRAST
TECHNIQUE: Multidetector CT imaging of the abdomen and pelvis was performed
following the standard protocol without IV contrast.

[Series 2: axial st · axial · 0.98mm/px · z∈[-481,-71]mm · 13 of 90 slices shown, 15 images]
[im 4/90  soft-tissue]
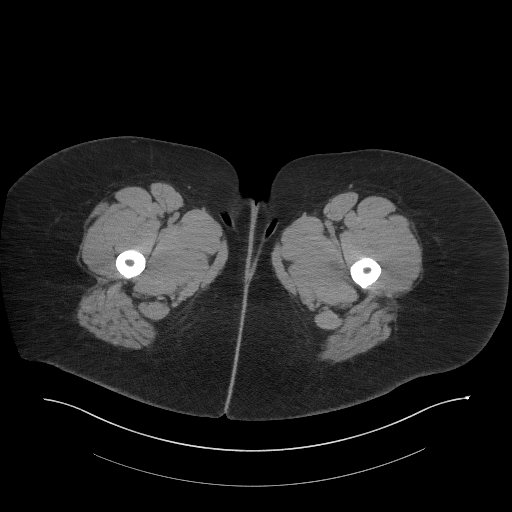
[im 4/90  bone]
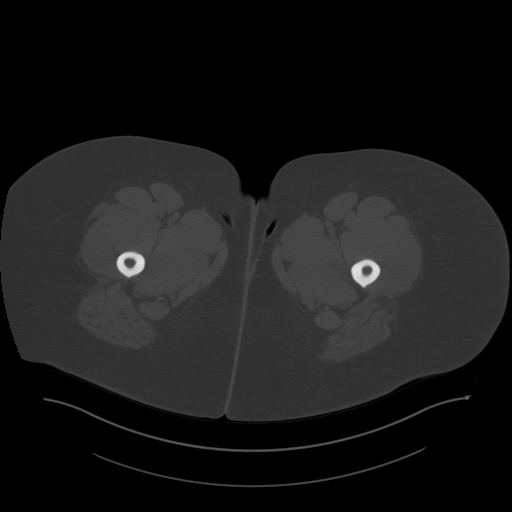
[im 12/90  soft-tissue]
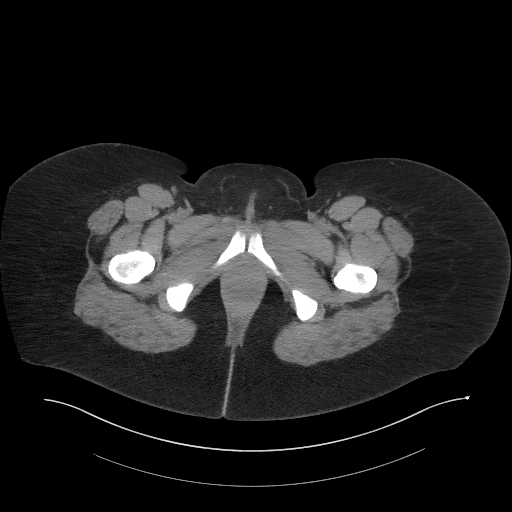
[im 20/90  soft-tissue]
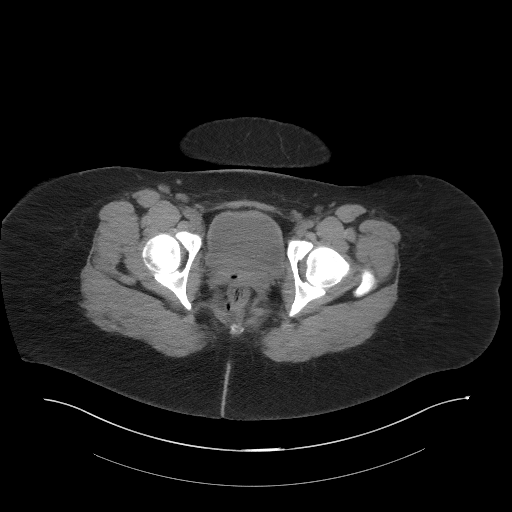
[im 24/90  soft-tissue]
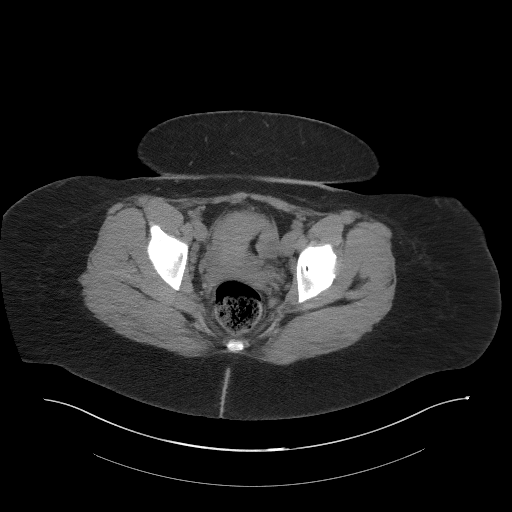
[im 31/90  soft-tissue]
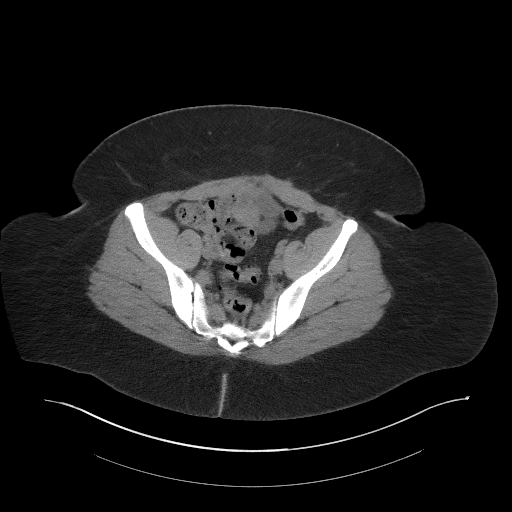
[im 39/90  soft-tissue]
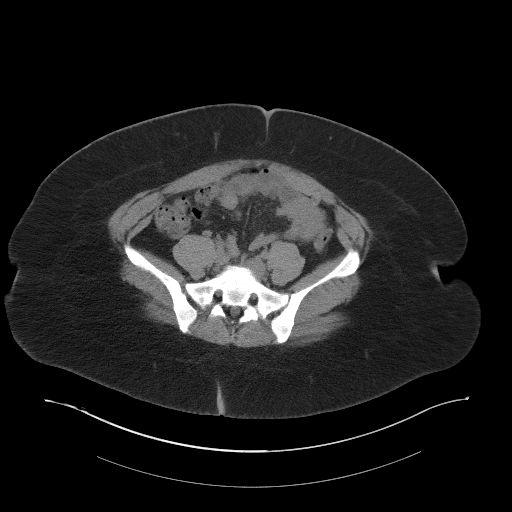
[im 47/90  soft-tissue]
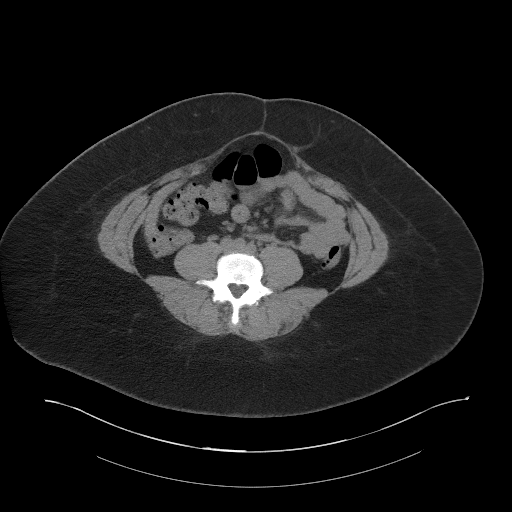
[im 51/90  soft-tissue]
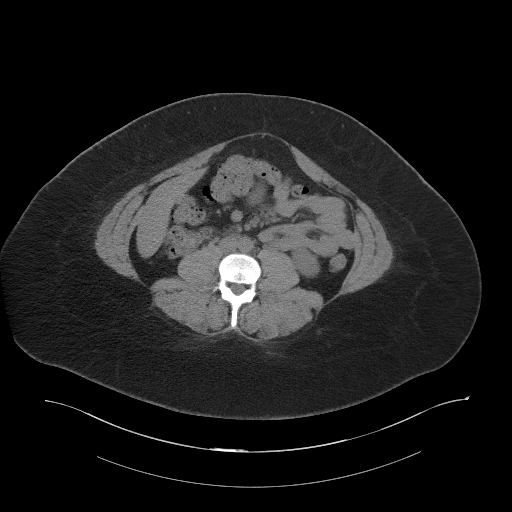
[im 59/90  soft-tissue]
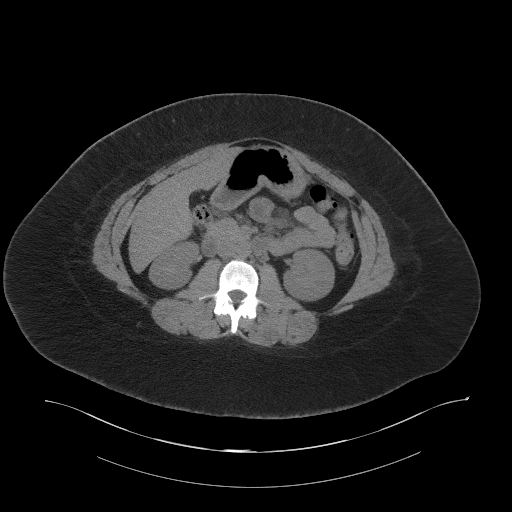
[im 59/90  bone]
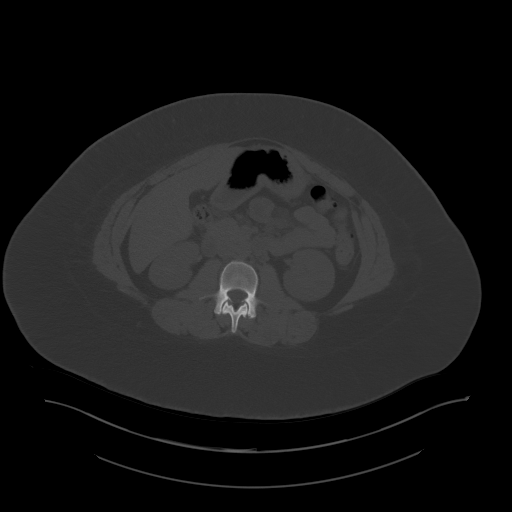
[im 66/90  soft-tissue]
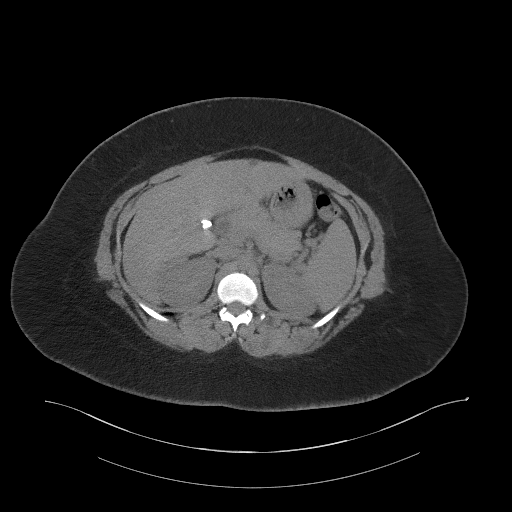
[im 70/90  soft-tissue]
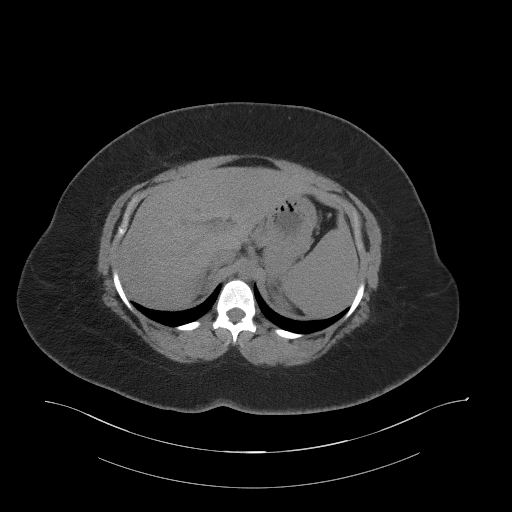
[im 78/90  soft-tissue]
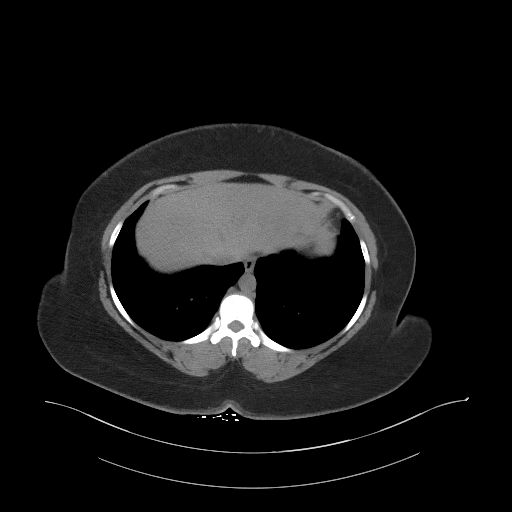
[im 86/90  soft-tissue]
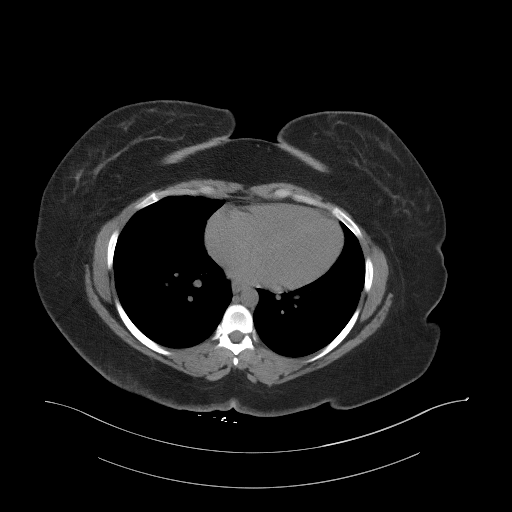

[Series 5: coronal st · coronal · 0.85mm/px · 3 of 105 slices shown]
[im 35/105  soft-tissue]
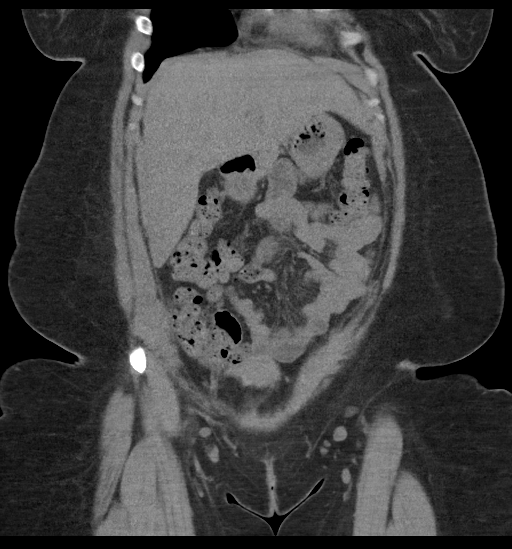
[im 47/105  soft-tissue]
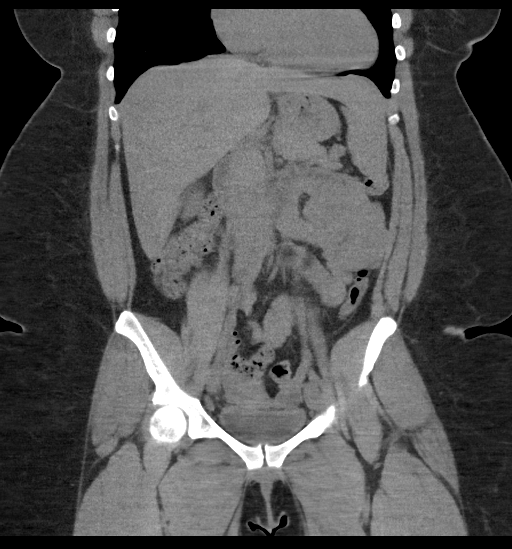
[im 58/105  soft-tissue]
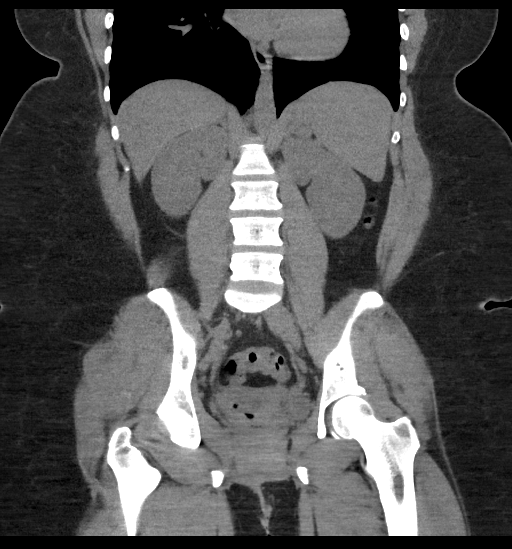

[16 of 46 positions shown; findings below may reference images not displayed]

FINDINGS: Lower chest: No worrisome lung base abnormality. Heart top-normal
size.

Hepatobiliary: Top-normal size liver. Possible small area of focal
fatty infiltration adjacent to the fissure for the falciform
ligament. Taking into account limitation by non contrast imaging, no
worrisome hepatic lesion. Post cholecystectomy. No calcified common
bile duct stone.

Pancreas: Taking into account limitation by non contrast imaging, no
worrisome pancreatic mass or inflammation.

Spleen: Taking into account limitation by non contrast imaging, no
worrisome splenic mass or enlargement.

Adrenals/Urinary Tract: No obstructing renal/ureteral stone or
hydronephrosis. Possible tiny nonobstructing right renal calculi.
Taking into account limitation by non contrast imaging, no worrisome
renal or adrenal lesion. Partially contracted noncontrast filled
views the urinary bladder unremarkable.

Stomach/Bowel: No extraluminal bowel inflammatory process. Portions
of stomach, small bowel and colon under distended and evaluation
slightly limited. No inflammation surrounds the appendix.

Vascular/Lymphatic: No abdominal aortic aneurysm. Scattered normal
size lymph nodes.

Reproductive: No worrisome uterine or adnexal mass.

Other: No free air or bowel containing hernia. Diastasis rectus
muscles.

Musculoskeletal: Degenerative changes L5-S1.
IMPRESSION: 1. No obstructing renal/ureteral stone or hydronephrosis.
2. No extraluminal bowel inflammatory process detected.
3. Mild degenerative changes L5-S1.
4. Post cholecystectomy.

## 2019-04-11 ENCOUNTER — Encounter: Payer: Self-pay | Admitting: Family Medicine

## 2019-04-11 ENCOUNTER — Other Ambulatory Visit: Payer: Self-pay | Admitting: Family Medicine

## 2019-04-11 ENCOUNTER — Other Ambulatory Visit: Payer: Self-pay

## 2019-04-11 ENCOUNTER — Other Ambulatory Visit (HOSPITAL_COMMUNITY)
Admission: RE | Admit: 2019-04-11 | Discharge: 2019-04-11 | Disposition: A | Discharge status: Home / Self Care | Payer: Medicaid Other | Source: Ambulatory Visit | Attending: Family Medicine | Admitting: Family Medicine

## 2019-04-11 ENCOUNTER — Ambulatory Visit: Payer: Medicaid Other | Admitting: Family Medicine

## 2019-04-11 ENCOUNTER — Telehealth: Payer: Self-pay

## 2019-04-11 VITALS — BP 130/84 | HR 102 | Wt 256.4 lb

## 2019-04-11 DIAGNOSIS — Z113 Encounter for screening for infections with a predominantly sexual mode of transmission: Secondary | ICD-10-CM | POA: Diagnosis present

## 2019-04-11 DIAGNOSIS — E119 Type 2 diabetes mellitus without complications: Secondary | ICD-10-CM | POA: Diagnosis not present

## 2019-04-11 DIAGNOSIS — Z124 Encounter for screening for malignant neoplasm of cervix: Secondary | ICD-10-CM | POA: Diagnosis not present

## 2019-04-11 DIAGNOSIS — R1030 Lower abdominal pain, unspecified: Secondary | ICD-10-CM

## 2019-04-11 LAB — POCT URINALYSIS DIP (MANUAL ENTRY)
Blood, UA: NEGATIVE
Glucose, UA: NEGATIVE mg/dL
Leukocytes, UA: NEGATIVE
Nitrite, UA: NEGATIVE
Protein Ur, POC: 100 mg/dL — AB
Spec Grav, UA: >=1.03 — AB (ref 1.010–1.025)
Urobilinogen, UA: 0.2 E.U./dL
pH, UA: 6 (ref 5.0–8.0)

## 2019-04-11 LAB — POCT WET PREP (WET MOUNT)
Clue Cells Wet Prep Whiff POC: NEGATIVE
Trichomonas Wet Prep HPF POC: ABSENT

## 2019-04-11 LAB — POCT UA - MICROSCOPIC ONLY

## 2019-04-11 LAB — POCT GLYCOSYLATED HEMOGLOBIN (HGB A1C): HbA1c, POC (controlled diabetic range): 11.1 % — AB (ref 0.0–7.0)

## 2019-04-11 LAB — POCT UA - MICROALBUMIN
Creatinine, POC: 200 mg/dL
Microalbumin Ur, POC: 150 mg/L

## 2019-04-11 MED ORDER — LIRAGLUTIDE 18 MG/3ML ~~LOC~~ SOPN: 0.6000 mg | PEN_INJECTOR | Freq: Every day | SUBCUTANEOUS | 3 refills | Status: DC

## 2019-04-11 MED ORDER — FREESTYLE SYSTEM KIT: 1.0000 | PACK | 0 refills | Status: AC | PRN

## 2019-04-11 MED ORDER — DULAGLUTIDE 0.75 MG/0.5ML ~~LOC~~ SOAJ: 0.7500 mg | SUBCUTANEOUS | 3 refills | Status: DC

## 2019-04-11 NOTE — Progress Notes (Signed)
Will order send out urine microalbumin and repeat UA at next appointment.

## 2019-04-11 NOTE — Assessment & Plan Note (Signed)
Mild TTP.  Given urinary symptoms, will check UA.  No fevers or CVA tenderness, therefore doubt pyelo.  No changes in bowel habits, therefore doubt diverticulitis.  Will also check gonorrhea and chlamydia.

## 2019-04-11 NOTE — Progress Notes (Signed)
Trulicity denied by Medicaid. Spoke with patient regarding [place] vs Victoza.  She agrees to [employer] and will use it daily. Rx sent for Victoza.

## 2019-04-11 NOTE — Telephone Encounter (Signed)
Medicaid does not cover Trulicity. Please see alteratives below.

## 2019-04-11 NOTE — Telephone Encounter (Signed)
Spoke with patient.  Rx sent for Victoza.

## 2019-04-11 NOTE — Progress Notes (Signed)
Subjective:  CC -- Annual Physical; With complaints of LLQ "tugging" with urination x1 week.  Hurts worse at the night time mostly.  No blood in urine, does note that it smells.  No back pain, fevers. Also notes some brown vaginal discharge x3 days with some mild irritation, also with odor.  No changes in bowel habits, no diarrhea. Would like to be tested for STDs today.  Cardiovascular: - Dx Hypertension: no  - Dx Hyperlipidemia: no  - Dx Obesity: yes, class III - Physical Activity: no, just working, has not exercised since March  - Diabetes: yes, metformin, has been very sporadic with taking it and A1c today 11, also notes that her diet has not been good during pandemic  Cancer: Colorectal >> Colonoscopy: no, no family history Lung >> Tobacco Use: no, quit early 2020, smoked 3-4 yrs and was only occasional/social Breast >> Mammogram: no, no fam hx Cervical/Endometrial >>  - Postmenopausal: no  - Vaginal Bleeding: yes, has nexplanon since 03/11/2018, coming monthly, but somewhat irregular - Pap Smear: yes, needs today   - Previous Abnormal Pap: no Skin >> Suspicious lesions: no   Social: Alcohol Use: yes, occasional wine on weekend  Tobacco Use: no, quit  Other Drugs: no  Risky Sexual Behavior: no, requesting STD check  Depression: no   - PHQ2 score: 0 Support and Life at Home: yes    ROS-Negative  Past Medical History Patient Active Problem List   Diagnosis Date Noted  . Screening for malignant neoplasm of cervix 04/11/2019  . Lower abdominal pain 04/11/2019  . Wheezing 08/28/2018  . Diabetes mellitus without complication, without long-term current use of insulin ([place]) 05/26/2016  . Anxiety state 04/28/2016  . GERD (gastroesophageal reflux disease) 04/28/2016  . Morbid obesity ([place]) 01/13/2014  . Halitosis 01/13/2014  . Implanon in place 01/13/2014  . Panic attacks 01/13/2014  . Encounter for screening examination for sexually transmitted disease 01/13/2014     Medications- reviewed and updated Current Outpatient Medications  Medication Sig Dispense Refill  . cyanocobalamin 500 MCG tablet Take 500 mcg by mouth daily.    . Dulaglutide 0.75 MG/0.5ML SOPN Inject 0.75 mg into the skin once a week. 4 pen 3  . ferrous sulfate 325 (65 FE) MG tablet Take 1 tablet (325 mg total) by mouth every other day. 90 tablet 3  . fluticasone (FLONASE) 50 MCG/ACT nasal spray Place 1 spray into both nostrils daily. 16 g 2  . glucose blood (ACCU-CHEK GUIDE) test strip Use to check blood sugar once daily or as instructed. 50 each 12  . glucose monitoring kit (FREESTYLE) monitoring kit 1 each by Does not apply route as needed for other. 1 each 0  . guaiFENesin-codeine 100-10 MG/5ML syrup Take 5 mLs by mouth every 6 (six) hours as needed for cough. 120 mL 0  . metFORMIN (GLUCOPHAGE) 1000 MG tablet Take 1 tablet (1,000 mg total) by mouth 2 (two) times daily with a meal. 180 tablet 3  . Multiple Vitamin (MULTIVITAMIN WITH MINERALS) TABS tablet Take 1 tablet by mouth daily.    Marland Kitchen omeprazole (PRILOSEC) 40 MG capsule TAKE 1 CAPSULE BY MOUTH EVERY DAY 30 capsule 3  . predniSONE (DELTASONE) 20 MG tablet Take 2 tablets (40 mg total) by mouth daily. (Patient not taking: Reported on 09/17/2018) 10 tablet 0  . Probiotic Product (PROBIOTIC-10 PO) Take by mouth.     No current facility-administered medications for this visit.     Objective: BP 130/84   Pulse Marland Kitchen)  102   Wt 256 lb 6.4 oz (116.3 kg)   LMP 03/20/2019 (Approximate)   SpO2 98%   BMI 51.79 kg/m  Gen: NAD, alert, cooperative with exam HEENT: NCAT, EOMI, PERRL CV: RRR, good S1/S2, no murmur Resp: CTABL, no wheezes, non-labored Abd: Soft, Mild TTP LLQ, Non Distended, BS present, no guarding or organomegaly Genital Exam: Pelvic exam performed with patient supine.  Chaperone in room.  Bilateral labia without abnormalities, no inguinal LAD palpated.  Cervix exhibits no discharge, no cervix abnormalities.  No vaginal lesions.   Vaginal discharge scant, brown, thin. Ext: No edema, warm Neuro: Alert and oriented, No gross deficits   Assessment/Plan:  Diabetes mellitus without complication, without long-term current use of insulin (HCC) Not controlled.  A1c increased to 11 today from 8.  Likely 2/2 metformin non-compliance, poor diet, and lack of exercise.  Counseled patient on need to have better control of A1c, she voiced understanding.  Encouraged to be compliant with medications, set alarms on her phone to take her medications, work on diet and exercise.  Discussed with patient various options for her diabetes medication, decided to start Trulicity 8.12 mg weekly.  Patient counseled on increased risk of thyroid cancer, denies family history of thyroid cancer.  Check lipid panel.  Will resend glucometer as patient has not received.  Follow-up in 3 months.  Lower abdominal pain Mild TTP.  Given urinary symptoms, will check UA.  No fevers or CVA tenderness, therefore doubt pyelo.  No changes in bowel habits, therefore doubt diverticulitis.  Will also check gonorrhea and chlamydia.  Morbid obesity ([place]) Discussed with patient need for adhering to diabetic diet and exercising.  She voiced understanding of this.  Encounter for screening examination for sexually transmitted disease Check gonorrhea/chlamydia, wet prep, HIV, RPR at patient's request.  Screening for malignant neoplasm of cervix Pap today, no history of abnormal Paps.  If negative, repeat in 3 years.    Orders Placed This Encounter  Procedures  . Lipid Panel  . HIV antibody (with reflex)  . RPR  . POCT glycosylated hemoglobin (Hb A1C)  . POCT UA - Microalbumin  . POCT urinalysis dipstick  . POCT UA - Microscopic Only  . POCT Wet Prep [facility])    Meds ordered this encounter  Medications  . Dulaglutide 0.75 MG/0.5ML SOPN    Sig: Inject 0.75 mg into the skin once a week.    Dispense:  4 pen    Refill:  3  . glucose monitoring kit (FREESTYLE)  monitoring kit    Sig: 1 each by Does not apply route as needed for other.    Dispense:  1 each    Refill:  0     [name], DO, PGY-2 04/11/2019 1:50 PM

## 2019-04-11 NOTE — Assessment & Plan Note (Signed)
Pap today, no history of abnormal Paps.  If negative, repeat in 3 years.

## 2019-04-11 NOTE — Assessment & Plan Note (Signed)
Discussed with patient need for adhering to diabetic diet and exercising.  She voiced understanding of this.

## 2019-04-11 NOTE — Patient Instructions (Signed)
Thank you for coming to see me today. It was a pleasure. Today we talked about:   Your diabetes: take your metformin twice a day and trulicity once a week.  Work on [employer] and exercise.  We will inform you of your results.  Please follow-up with me in 3 months.  If you have any questions or concerns, please do not hesitate to call the office at [phone].  Best,   [name], DO

## 2019-04-11 NOTE — Assessment & Plan Note (Addendum)
Check gonorrhea/chlamydia, wet prep, HIV, RPR at patient's request.

## 2019-04-11 NOTE — Assessment & Plan Note (Addendum)
Not controlled.  A1c increased to 11 today from 8.  Likely 2/2 metformin non-compliance, poor diet, and lack of exercise.  Counseled patient on need to have better control of A1c, she voiced understanding.  Encouraged to be compliant with medications, set alarms on her phone to take her medications, work on diet and exercise.  Discussed with patient various options for her diabetes medication, decided to start Trulicity 9.01 mg weekly.  Patient counseled on increased risk of thyroid cancer, denies family history of thyroid cancer.  Check lipid panel.  Will resend glucometer as patient has not received.  Follow-up in 3 months.

## 2019-04-12 LAB — LIPID PANEL
Chol/HDL Ratio: 3.1 ratio (ref 0.0–4.4)
Cholesterol, Total: 154 mg/dL (ref 100–199)
HDL: 49 mg/dL (ref >39)
LDL Calculated: 82 mg/dL (ref 0–99)
Triglycerides: 114 mg/dL (ref 0–149)
VLDL Cholesterol Cal: 23 mg/dL (ref 5–40)

## 2019-04-12 LAB — HIV ANTIBODY (ROUTINE TESTING W REFLEX): HIV Screen 4th Generation wRfx: NONREACTIVE

## 2019-04-12 LAB — RPR: RPR Ser Ql: NONREACTIVE

## 2019-04-14 ENCOUNTER — Telehealth: Payer: Self-pay | Admitting: *Deleted

## 2019-04-14 ENCOUNTER — Other Ambulatory Visit: Payer: Self-pay | Admitting: Family Medicine

## 2019-04-14 DIAGNOSIS — E119 Type 2 diabetes mellitus without complications: Secondary | ICD-10-CM

## 2019-04-14 LAB — CYTOLOGY - PAP: Diagnosis: NEGATIVE

## 2019-04-14 MED ORDER — ACCU-CHEK FASTCLIX LANCET KIT: 1.0000 | PACK | Freq: Two times a day (BID) | 1 refills | Status: AC

## 2019-04-14 NOTE — Telephone Encounter (Signed)
Pt states that the freestyle meter is not covered by her insurance.  Pharmacy advised that the dexcom meter should be covered. Christen Bame, CMA

## 2019-04-14 NOTE — Telephone Encounter (Signed)
Spoke with pharmacy as dexcom is only continuous glucose monitoring system, which she does not need.  She has an accu-check guide on hold at pharmacy that they stated should be covered by insurance.  I also sent Rx for accu-check lancets.  Please let patient know.

## 2019-04-14 NOTE — Telephone Encounter (Signed)
Pt informed of below.April Zimmerman Rumple, CMA ? ?

## 2019-04-14 NOTE — Progress Notes (Signed)
Spoke with pharmacy that patient has an accu-check guide on hold at pharmacy.  Just need lancets.  Rx sent for lancets.

## 2019-04-15 ENCOUNTER — Telehealth: Payer: Self-pay | Admitting: *Deleted

## 2019-04-15 ENCOUNTER — Other Ambulatory Visit: Payer: Self-pay | Admitting: Family Medicine

## 2019-04-15 DIAGNOSIS — E119 Type 2 diabetes mellitus without complications: Secondary | ICD-10-CM

## 2019-04-15 LAB — CERVICOVAGINAL ANCILLARY ONLY
Chlamydia: NEGATIVE
Neisseria Gonorrhea: NEGATIVE

## 2019-04-15 MED ORDER — INSULIN PEN NEEDLE 32G X 4 MM MISC: 1.0000 | Freq: Every day | 3 refills | Status: DC

## 2019-04-15 NOTE — Telephone Encounter (Signed)
Pt needs pen needles sent in for her victoza to [facility] on [address].  Christen Bame, CMA

## 2019-04-15 NOTE — Telephone Encounter (Signed)
Pt informed of below.Cataldo Cosgriff Zimmerman Rumple, CMA ? ?

## 2019-04-15 NOTE — Telephone Encounter (Signed)
-----   Message from Cleophas Dunker, DO sent at 04/15/2019  3:21 PM EDT ----- Please inform patient of negative result.

## 2019-04-15 NOTE — Telephone Encounter (Signed)
Sent!

## 2019-04-15 NOTE — Progress Notes (Signed)
victoza needles sent.

## 2019-04-16 MED ORDER — DULAGLUTIDE 0.75 MG/0.5ML ~~LOC~~ SOAJ: 0.7500 mg | SUBCUTANEOUS | 3 refills | Status: DC

## 2019-04-16 NOTE — Addendum Note (Signed)
Addended by: Christen Bame D on: 04/16/2019 05:27 PM   Modules accepted: Orders

## 2019-04-16 NOTE — Telephone Encounter (Signed)
Pt calls.  She received both trulicity and victoza for a copay of $3.  Called pharmacy, this must have been a glitch in the system because we never completed the PA for patient and no record in nctracks.  Spoke with Dr. Sandi Carne since she has trulicity we can try it.  Pt will set victoza aside and hold it for the time being.  Fear is that trulicity will not be covered next refill.  We will work on that at that time. Christen Bame, CMA

## 2019-06-06 ENCOUNTER — Other Ambulatory Visit: Payer: Self-pay

## 2019-06-06 ENCOUNTER — Telehealth: Payer: Self-pay | Admitting: *Deleted

## 2019-06-06 ENCOUNTER — Ambulatory Visit (INDEPENDENT_AMBULATORY_CARE_PROVIDER_SITE_OTHER): Payer: Medicaid Other | Admitting: *Deleted

## 2019-06-06 DIAGNOSIS — Z23 Encounter for immunization: Secondary | ICD-10-CM

## 2019-06-06 NOTE — Telephone Encounter (Signed)
Pt "has fallen on hard times" and had to move in with her sister.  Her sister in public housing and order for pt to stay with her temporarily she will need a letter stating that she needs to stay with her sister because of her disability.  To PCP. Christen Bame, CMA

## 2019-06-10 ENCOUNTER — Encounter: Payer: Self-pay | Admitting: Family Medicine

## 2019-06-10 NOTE — Telephone Encounter (Signed)
Call back patient to discuss.  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  Now living with sister who uses public housing.  Has no other immediate family or close friends to stay with.  Noted history of diabetes and anxiety.  Will write a letter stating it would be to her benefit for stable housing due to these conditions.  Additionally, sent MyChart message including housing resources for her to assess if she could qualify for additional housing assistance.  Patient to call if she needs letter printed with signature, rather than printing letter on her own.  Patriciaann Clan, DO

## 2019-06-10 NOTE — Telephone Encounter (Signed)
Pt calls to check status.  She needs this ASAP and is fine with a telephone visit if needed. Christen Bame, CMA

## 2019-06-16 ENCOUNTER — Telehealth (INDEPENDENT_AMBULATORY_CARE_PROVIDER_SITE_OTHER): Payer: Medicaid Other | Admitting: Family Medicine

## 2019-06-16 ENCOUNTER — Encounter: Payer: Self-pay | Admitting: Family Medicine

## 2019-06-16 ENCOUNTER — Other Ambulatory Visit: Payer: Self-pay

## 2019-06-16 ENCOUNTER — Telehealth: Payer: Self-pay | Admitting: *Deleted

## 2019-06-16 DIAGNOSIS — Z20822 Contact with and (suspected) exposure to covid-19: Secondary | ICD-10-CM | POA: Insufficient documentation

## 2019-06-16 DIAGNOSIS — Z20828 Contact with and (suspected) exposure to other viral communicable diseases: Secondary | ICD-10-CM | POA: Insufficient documentation

## 2019-06-16 NOTE — Assessment & Plan Note (Addendum)
-  Sent for testing at [facility].  -Per CDC recommedations isolate for 10 days from the start of symptoms 06/23/2019.  -Must be 72 hours afebrile without the use of antipyretics. -If symptoms worsen go to your closest ED. -Guidelines of isolation and work excuse sent via [employer]

## 2019-06-16 NOTE — Progress Notes (Addendum)
[facility] Telemedicine Visit  Patient consented to have virtual visit. Method of visit: Telephone  Encounter participants: Patient: Sandra [name] - located at [place] [id number]  Provider: Gerlene Fee - located telehealth  Chief Complaint: COVID exposure with symptoms  HPI: COVID Drive-Up Test Referral Criteria  Patient age: 28 y.o.  Symptoms: Cough, Shortness of breath, Muscle pain and Sore throat  Underlying Conditions: Severe obesity and Diabetes  Is the patient a first responder? No  Does the patient live or work in a high risk or high density environment: No   Is the patient a COVID convalescent patient who is 14-28 days symptom-free and interested in donating plasma for use as a therapeutic product? No   ROS: per HPI  Pertinent PMHx: Diabetes, morbid obesity  Exam:  Respiratory: denies chest pain, chest tightness, endorse DOE  Assessment/Plan:  Exposure to Covid-19 Virus -Sent for testing at [facility]  -Per CDC recommedations isolate for 10 days from the start of symptoms 06/23/2019.  -Must be 72 hours afebrile without the use of antipyretics. -If symptoms worsen go to your closest ED. -Guidelines of isolation and work excuse sent via [place]    Time spent during visit with patient: 20 minutes

## 2019-06-16 NOTE — Telephone Encounter (Signed)
Job was requiring a letter stating that she was tested for covid today.  Letter create, pt to call me back if she is unable to see in her mychart. Christen Bame, CMA

## 2019-06-17 ENCOUNTER — Other Ambulatory Visit: Payer: Self-pay

## 2019-06-17 ENCOUNTER — Emergency Department (HOSPITAL_BASED_OUTPATIENT_CLINIC_OR_DEPARTMENT_OTHER): Payer: Medicaid Other

## 2019-06-17 ENCOUNTER — Emergency Department (HOSPITAL_BASED_OUTPATIENT_CLINIC_OR_DEPARTMENT_OTHER)
Admission: EM | Admit: 2019-06-17 | Discharge: 2019-06-17 | Disposition: A | Discharge status: Home / Self Care | Payer: Medicaid Other | Attending: Emergency Medicine | Admitting: Emergency Medicine

## 2019-06-17 ENCOUNTER — Encounter (HOSPITAL_BASED_OUTPATIENT_CLINIC_OR_DEPARTMENT_OTHER): Payer: Self-pay

## 2019-06-17 DIAGNOSIS — R0602 Shortness of breath: Secondary | ICD-10-CM | POA: Insufficient documentation

## 2019-06-17 DIAGNOSIS — Z79899 Other long term (current) drug therapy: Secondary | ICD-10-CM | POA: Diagnosis not present

## 2019-06-17 DIAGNOSIS — Z7984 Long term (current) use of oral hypoglycemic drugs: Secondary | ICD-10-CM | POA: Insufficient documentation

## 2019-06-17 DIAGNOSIS — R05 Cough: Secondary | ICD-10-CM

## 2019-06-17 DIAGNOSIS — Z87891 Personal history of nicotine dependence: Secondary | ICD-10-CM | POA: Diagnosis not present

## 2019-06-17 DIAGNOSIS — Z20822 Contact with and (suspected) exposure to covid-19: Secondary | ICD-10-CM

## 2019-06-17 DIAGNOSIS — E119 Type 2 diabetes mellitus without complications: Secondary | ICD-10-CM | POA: Diagnosis not present

## 2019-06-17 DIAGNOSIS — R059 Cough, unspecified: Secondary | ICD-10-CM

## 2019-06-17 LAB — NOVEL CORONAVIRUS, NAA: SARS-CoV-2, NAA: DETECTED — AB

## 2019-06-17 MED ORDER — NAPROXEN 500 MG PO TABS: 500.0000 mg | ORAL_TABLET | Freq: Two times a day (BID) | ORAL | 0 refills | Status: AC

## 2019-06-17 MED ORDER — ALBUTEROL SULFATE HFA 108 (90 BASE) MCG/ACT IN AERS: 1.0000 | INHALATION_SPRAY | Freq: Four times a day (QID) | RESPIRATORY_TRACT | 0 refills | Status: DC | PRN

## 2019-06-17 MED ORDER — BENZONATATE 100 MG PO CAPS: 100.0000 mg | ORAL_CAPSULE | Freq: Three times a day (TID) | ORAL | 0 refills | Status: DC

## 2019-06-17 MED ORDER — PREDNISONE 10 MG PO TABS: 40.0000 mg | ORAL_TABLET | Freq: Every day | ORAL | 0 refills | Status: AC

## 2019-06-17 MED ORDER — BENZONATATE 100 MG PO CAPS
200.0000 mg | ORAL_CAPSULE | Freq: Once | ORAL | Status: AC
  Administered 2019-06-17: 200 mg via ORAL
  Filled 2019-06-17: qty 2

## 2019-06-17 NOTE — ED Triage Notes (Signed)
Pt with flu like sx-started with HA 5 days ago-pt with +covid family member-pt had covid test per PCP order yesterday-NAD-steady gait

## 2019-06-17 NOTE — ED Provider Notes (Signed)
[facility] EMERGENCY DEPARTMENT Provider Note   CSN: [id number] Arrival date & time: 06/17/19  1403     History   Chief Complaint Chief Complaint  Patient presents with  . Cough    HPI Sandra [name] is a 28 y.o. female.     28 year old female with past medical history of diabetes, anemia presenting to the emergency department for cough.  Patient reports that she has had a worsening cough over the last couple of days.  Reports that her sister, who lives with her, was positive for COVID-19.  She was tested yesterday but does not know the results.  She reports that she is having some shortness of breath.  Denies any nausea, vomiting, fever, chills.  Reports that she has having body aches and feeling overall fatigued.     Past Medical History:  Diagnosis Date  . Anemia   . Diabetes mellitus without complication ([place])   . GERD (gastroesophageal reflux disease)   . Hemorrhage after delivery of fetus    Intraoperative, received blood transfusion  . History of blood transfusion   . Hx of ectopic pregnancy     Patient Active Problem List   Diagnosis Date Noted  . Exposure to Covid-19 Virus 06/16/2019  . Screening for malignant neoplasm of cervix 04/11/2019  . Lower abdominal pain 04/11/2019  . Wheezing 08/28/2018  . Diabetes mellitus without complication, without long-term current use of insulin ([place]) 05/26/2016  . Anxiety state 04/28/2016  . GERD (gastroesophageal reflux disease) 04/28/2016  . Morbid obesity ([place]) 01/13/2014  . Halitosis 01/13/2014  . Implanon in place 01/13/2014  . Panic attacks 01/13/2014  . Encounter for screening examination for sexually transmitted disease 01/13/2014    Past Surgical History:  Procedure Laterality Date  . CESAREAN SECTION    . CESAREAN SECTION  04/23/2012   Procedure: CESAREAN SECTION;  Surgeon: Donnamae Jude, MD;  Location: [facility];  Service: Gynecology;  Laterality: N/A;  . CHOLECYSTECTOMY  10/23/2012   Procedure:  LAPAROSCOPIC CHOLECYSTECTOMY WITH INTRAOPERATIVE CHOLANGIOGRAM;  Surgeon: Pedro Earls, MD;  Location: [facility];  Service: General;  Laterality: N/A;  . LAPAROSCOPY FOR ECTOPIC PREGNANCY       OB History    Gravida  4   Para  2   Term  2   Preterm      AB  1   Living  2     SAB      TAB      Ectopic  1   Multiple      Live Births  2            Home Medications    Prior to Admission medications   Medication Sig Start Date End Date Taking? Authorizing Provider  albuterol (VENTOLIN HFA) 108 (90 Base) MCG/ACT inhaler Inhale 1-2 puffs into the lungs every 6 (six) hours as needed for wheezing or shortness of breath. 06/17/19   Madilyn Hook A, PA-C  benzonatate (TESSALON) 100 MG capsule Take 1 capsule (100 mg total) by mouth every 8 (eight) hours. 06/17/19   Alveria Apley, PA-C  cyanocobalamin 500 MCG tablet Take 500 mcg by mouth daily.    [provider]  Dulaglutide 0.75 MG/0.5ML SOPN Inject 0.75 mg into the skin once a week. 04/16/19   Meccariello, Bernita Raisin, DO  ferrous sulfate 325 (65 FE) MG tablet Take 1 tablet (325 mg total) by mouth every other day. 06/28/18   Glenis Smoker, MD  fluticasone (FLONASE) 50 MCG/ACT nasal  spray Place 1 spray into both nostrils daily. 06/25/18   Glenis Smoker, MD  glucose blood (ACCU-CHEK GUIDE) test strip Use to check blood sugar once daily or as instructed. 09/19/18   Leeanne Rio, MD  glucose monitoring kit (FREESTYLE) monitoring kit 1 each by Does not apply route as needed for other. 04/11/19   Meccariello, Bernita Raisin, DO  guaiFENesin-codeine 100-10 MG/5ML syrup Take 5 mLs by mouth every 6 (six) hours as needed for cough. 08/01/18   McDiarmid, Blane Ohara, MD  Insulin Pen Needle 32G X 4 MM MISC 1 Piece by Does not apply route daily. 04/15/19   Meccariello, Bernita Raisin, DO  Lancets Misc. (ACCU-CHEK FASTCLIX LANCET) KIT 1 Piece by Does not apply route 2 (two) times daily. 04/14/19   Meccariello, Bernita Raisin, DO  metFORMIN  (GLUCOPHAGE) 1000 MG tablet Take 1 tablet (1,000 mg total) by mouth 2 (two) times daily with a meal. 06/25/18   Glenis Smoker, MD  Multiple Vitamin (MULTIVITAMIN WITH MINERALS) TABS tablet Take 1 tablet by mouth daily.    [provider]  naproxen (NAPROSYN) 500 MG tablet Take 1 tablet (500 mg total) by mouth 2 (two) times daily for 7 days. 06/17/19 06/24/19  Madilyn Hook A, PA-C  omeprazole (PRILOSEC) 40 MG capsule TAKE 1 CAPSULE BY MOUTH EVERY DAY 10/07/18   Glenis Smoker, MD  predniSONE (DELTASONE) 10 MG tablet Take 4 tablets (40 mg total) by mouth daily for 5 days. 06/17/19 06/22/19  Madilyn Hook A, PA-C  Probiotic Product (PROBIOTIC-10 PO) Take by mouth.    [provider]    Family History Family History  Problem Relation Age of Onset  . High blood pressure Mother   . Aneurysm Mother   . Hypertension Mother   . Drug abuse Mother   . Diabetes Mellitus II Father   . Hyperlipidemia Father   . Alcohol abuse Father   . Heart disease Maternal Aunt   . Diabetes Paternal Grandmother   . Diabetes Paternal Grandfather   . Other Neg Hx     Social History Social History   Tobacco Use  . Smoking status: Former Research scientist (life sciences)  . Smokeless tobacco: Never Used  Substance Use Topics  . Alcohol use: Yes    Comment: occasionally  . Drug use: No     Allergies   Shellfish allergy   Review of Systems Review of Systems  Constitutional: Positive for appetite change and fatigue. Negative for diaphoresis, fever and unexpected weight change.  HENT: Positive for sore throat. Negative for congestion.   Eyes: Negative for redness.  Respiratory: Positive for cough and shortness of breath.   Cardiovascular: Negative for chest pain, palpitations and leg swelling.  Gastrointestinal: Negative for abdominal pain, diarrhea, nausea and vomiting.  Endocrine: Negative for polyuria.  Genitourinary: Negative for dysuria.  Musculoskeletal: Positive for myalgias. Negative for  arthralgias.  Skin: Negative for rash and wound.  Allergic/Immunologic: Negative for immunocompromised state.  Neurological: Positive for headaches. Negative for dizziness and light-headedness.  Hematological: Does not bruise/bleed easily.     Physical Exam Updated Vital Signs BP 117/76 (BP Location: Left Arm)   Pulse 89   Temp 98.3 F (36.8 C) (Oral)   Resp 18   Ht '4\' 11"'  (1.499 m)   Wt 106.6 kg   LMP 06/04/2019   SpO2 99%   BMI 47.46 kg/m   Physical Exam Vitals signs and nursing note reviewed.  Constitutional:      Appearance: Normal appearance.  HENT:  Head: Normocephalic.     Mouth/Throat:     Mouth: Mucous membranes are moist.  Eyes:     Conjunctiva/sclera: Conjunctivae normal.  Cardiovascular:     Rate and Rhythm: Normal rate and regular rhythm.     Pulses: Normal pulses.     Heart sounds: Normal heart sounds.  Pulmonary:     Effort: Pulmonary effort is normal.     Breath sounds: Wheezing (Very mild scattered wheezes.) present.  Abdominal:     General: Abdomen is flat.     Palpations: Abdomen is soft.  Skin:    General: Skin is dry.  Neurological:     Mental Status: She is alert.  Psychiatric:        Mood and Affect: Mood normal.      ED Treatments / Results  Labs (all labs ordered are listed, but only abnormal results are displayed) Labs Reviewed - No data to display  EKG None  Radiology Dg Chest Portable 1 View  Result Date: 06/17/2019 CLINICAL DATA:  Cough EXAM: PORTABLE CHEST 1 VIEW COMPARISON:  08/24/2018 FINDINGS: The heart size and mediastinal contours are within normal limits. Both lungs are clear. The visualized skeletal structures are unremarkable. IMPRESSION: No acute abnormality of the lungs in AP portable projection. Electronically Signed   By: Eddie Candle M.D.   On: 06/17/2019 15:52    Procedures Procedures (including critical care time)  Medications Ordered in ED Medications  benzonatate (TESSALON) capsule 200 mg (200 mg  Oral Given 06/17/19 1526)     Initial Impression / Assessment and Plan / ED Course  I have reviewed the triage vital signs and the nursing notes.  Pertinent labs & imaging results that were available during my care of the patient were reviewed by me and considered in my medical decision making (see chart for details).  Clinical Course as of Jun 17 1623  Tue Jun 17, 2019  1526 Patient here with COVID-19 symptoms after close exposure. Stable, normal vitals, not hypoxic. Patient requesting medication for her symptoms. Has mild wheeze on exam. Will obtain chest xray.    [KM]  1623 Chest xray clear. Patient appears well, no hypoxia and does not meet admission criteria. I will refill patient's albuterol and give short course of prednisone du to her wheezing as well as tessalon pearls and naproxen for body aches. Advised on home quarantine and return precautions    [KM]    Clinical Course User Index [KM] Alveria Apley, PA-C       Based on review of vitals, medical screening exam, lab work and/or imaging, there does not appear to be an acute, emergent etiology for the patient's symptoms. Counseled pt on good return precautions and encouraged both PCP and ED follow-up as needed.  Prior to discharge, I also discussed incidental imaging findings with patient in detail and advised appropriate, recommended follow-up in detail.  Clinical Impression: 1. Cough   2. Suspected Covid-19 Virus Infection     Disposition: Discharge  Prior to providing a prescription for a controlled substance, I independently reviewed the patient's recent prescription history on the [place]. The patient had no recent or regular prescriptions and was deemed appropriate for a brief, less than 3 day prescription of narcotic for acute analgesia.  This note was prepared with assistance of Systems analyst. Occasional wrong-word or sound-a-like substitutions may  have occurred due to the inherent limitations of voice recognition software.   Final Clinical Impressions(s) / ED Diagnoses  Final diagnoses:  Cough  Suspected Covid-19 Virus Infection    ED Discharge Orders         Ordered    benzonatate (TESSALON) 100 MG capsule  Every 8 hours     06/17/19 1623    albuterol (VENTOLIN HFA) 108 (90 Base) MCG/ACT inhaler  Every 6 hours PRN     06/17/19 1623    predniSONE (DELTASONE) 10 MG tablet  Daily     06/17/19 1623    naproxen (NAPROSYN) 500 MG tablet  2 times daily     06/17/19 1623           Kristine Royal 06/17/19 1624    Little, Wenda Overland, MD 06/18/19 1414

## 2019-06-18 ENCOUNTER — Telehealth (INDEPENDENT_AMBULATORY_CARE_PROVIDER_SITE_OTHER): Payer: Medicaid Other | Admitting: Family Medicine

## 2019-06-18 DIAGNOSIS — U071 COVID-19: Secondary | ICD-10-CM

## 2019-06-18 NOTE — Assessment & Plan Note (Signed)
Virus detected. Patient aware -Continue prednisone -Continue tessalon and naproxen PRN -Patient evaluated, tested positive and home with instructions for home care and Quarantine. Instructed to seek further care if symptoms worsen.

## 2019-06-18 NOTE — Progress Notes (Signed)
[facility] Telemedicine Visit  Patient consented to have virtual visit. Method of visit: Video  Encounter participants: Patient: Sandra [name] - located at home Provider: Gerlene Fee - located at home office  Chief Complaint: Worsening COVID symptoms  HPI: Sandra [name] was experiencing worsenging COVID symotoms yesterday. She had a telemedicine visit with me Monday and was sent for testing and found to be COVID positivet. She was endorsing chest tightness, dyspnea, and a migraine headache. She went to the ED yesterday afternoon where she was given prednisone, naproxen, and tessalon. Today she says she is feeling better and can take deep breaths. She is not as fatigued, and her cough has improved.   We discussed continuing to monitor her blood sugar while taking prednisone as she has done this in the past. And she understand that her blood sugar may increase due to the medication but should resume to her normal baseline after cessation.  Her only other complaint today is stomach and back pain before and after using restroom. She states she does experience this around her menstrual cycle and today is her last day of her cycle. She has a nexplanon which makes her period irregular.   ROS: per HPI  Pertinent PMHx:  Patient Active Problem List   Diagnosis Date Noted  . COVID-19 virus detected 06/18/2019  . Exposure to Covid-19 Virus 06/16/2019  . Screening for malignant neoplasm of cervix 04/11/2019  . Lower abdominal pain 04/11/2019  . Wheezing 08/28/2018  . Diabetes mellitus without complication, without long-term current use of insulin ([place]) 05/26/2016  . Anxiety state 04/28/2016  . GERD (gastroesophageal reflux disease) 04/28/2016  . Morbid obesity ([place]) 01/13/2014  . Halitosis 01/13/2014  . Implanon in place 01/13/2014  . Panic attacks 01/13/2014  . Encounter for screening examination for sexually transmitted disease 01/13/2014     Exam:   General:  Appears well, no acute distress. Age appropriate. Respiratory: normal effort Neuro: alert and oriented Psych: normal affect  COVID-19 Labs  Lab Results  Component Value Date   SARSCOV2NAA Detected (A) 06/16/2019    Assessment/Plan:  COVID-19 virus detected Virus detected. Patient aware -Continue prednisone -Continue tessalon and naproxen PRN -Patient evaluated, tested positive and home with instructions for home care and Quarantine. Instructed to seek further care if symptoms worsen.       Time spent during visit with patient: 10 minutes  Gerlene Fee, [place] PGY-1

## 2019-06-27 ENCOUNTER — Telehealth: Payer: Self-pay | Admitting: *Deleted

## 2019-06-27 ENCOUNTER — Telehealth (INDEPENDENT_AMBULATORY_CARE_PROVIDER_SITE_OTHER): Payer: Medicaid Other | Admitting: Family Medicine

## 2019-06-27 ENCOUNTER — Ambulatory Visit: Payer: Medicaid Other

## 2019-06-27 ENCOUNTER — Other Ambulatory Visit: Payer: Self-pay

## 2019-06-27 DIAGNOSIS — U071 COVID-19: Secondary | ICD-10-CM | POA: Diagnosis not present

## 2019-06-27 NOTE — Progress Notes (Signed)
[facility] Telemedicine Visit  Patient consented to have virtual visit. Method of visit: Telephone  Encounter participants: Patient: Sandra [name] - located at home Provider: Cleophas Dunker - located at [facility] Others (if applicable): none  Chief Complaint: f/u COVID positive status and letter for work  HPI:  Sandra [name] is a 28 yo female who was diagnosed with COVID-19 infection on 9/28.  Notes that she has been improving from a respiratory standpoint, but continues to have significant DOE when attempting to exercise.  Also notes cough while exercising and lying flat.  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  Denies fevers in >3 days.  Denies shortness of breath at rest.  ROS: per HPI  Pertinent PMHx: DM, GERD  Exam:  Respiratory: speaking in complete sentences, in NAD over the phone  Assessment/Plan:  COVID-19 virus infection Patient likely having post-COVID shortness of breath with exertion.  Reassured that she is not having symptoms at rest, but given her very strenuous job, will excuse her for 1 week further of work.  Advised to call back next week if still experiencing symptom enough to not work.  Would consider CXR at that time to assess level of infiltrates.  From an infectious standpoint, she is cleared to discontinue quarantine per CDC guidelines.  She was advised of this and voiced understanding.  Letter sent to patient via [place].    Time spent during visit with patient: 12 minutes

## 2019-06-27 NOTE — Telephone Encounter (Signed)
Pts work is requesting a more detailed letter.  Per pt it needs to say that she cant return because she is having (specific) symptoms. Christen Bame, CMA

## 2019-06-27 NOTE — Assessment & Plan Note (Signed)
Patient likely having post-COVID shortness of breath with exertion.  Reassured that she is not having symptoms at rest, but given her very strenuous job, will excuse her for 1 week further of work.  Advised to call back next week if still experiencing symptom enough to not work.  Would consider CXR at that time to assess level of infiltrates.  From an infectious standpoint, she is cleared to discontinue quarantine per CDC guidelines.  She was advised of this and voiced understanding.  Letter sent to patient via [place].

## 2019-06-30 ENCOUNTER — Other Ambulatory Visit: Payer: Self-pay

## 2019-06-30 ENCOUNTER — Encounter: Payer: Self-pay | Admitting: Family Medicine

## 2019-06-30 ENCOUNTER — Ambulatory Visit (INDEPENDENT_AMBULATORY_CARE_PROVIDER_SITE_OTHER): Payer: Medicaid Other | Admitting: Family Medicine

## 2019-06-30 ENCOUNTER — Other Ambulatory Visit (HOSPITAL_COMMUNITY)
Admission: RE | Admit: 2019-06-30 | Discharge: 2019-06-30 | Disposition: A | Discharge status: Home / Self Care | Payer: Medicaid Other | Source: Ambulatory Visit | Attending: Family Medicine | Admitting: Family Medicine

## 2019-06-30 VITALS — BP 112/64 | HR 94 | Wt 235.8 lb

## 2019-06-30 DIAGNOSIS — N898 Other specified noninflammatory disorders of vagina: Secondary | ICD-10-CM

## 2019-06-30 LAB — POCT URINE PREGNANCY: Preg Test, Ur: NEGATIVE

## 2019-06-30 NOTE — Telephone Encounter (Signed)
Letter created and in chart.

## 2019-07-01 ENCOUNTER — Encounter: Payer: Self-pay | Admitting: Family Medicine

## 2019-07-01 DIAGNOSIS — K219 Gastro-esophageal reflux disease without esophagitis: Secondary | ICD-10-CM

## 2019-07-01 MED ORDER — OMEPRAZOLE 40 MG PO CPDR: DELAYED_RELEASE_CAPSULE | ORAL | 0 refills | Status: DC

## 2019-07-01 NOTE — Assessment & Plan Note (Signed)
Patient with complaint for the last few days of vaginal discharge, denies any dysuria, pelvic pain, abdominal pain, abnormal bleeding for her.  Says she is safe at home and no one is making her do anything she does not want to do.  Ordered urine pregnancy test and wet prep with gonorrhea chlamydia cytology, on physical exam there were no pathologic external lesions and no discharge visualized.  When lab went to run the test they said there was not enough of a sample on the swab to be able to even test.  Patient has been notified via my chart at her request

## 2019-07-01 NOTE — Progress Notes (Signed)
    Subjective:  Sandra [name] is a 28 y.o. female who presents to the [facility] today with a chief complaint of vaginal discharge.   HPI: Vaginal discharge Patient with complaint for the last few days of vaginal discharge, denies any dysuria, pelvic pain, abdominal pain, abnormal bleeding for her.  Says she is safe at home and no one is making her do anything she does not want to do.   Objective:  Physical Exam: BP 112/64   Pulse 94   Wt 235 lb 12.8 oz (107 kg)   LMP 06/30/2019 (Exact Date)   SpO2 98%   BMI 47.63 kg/m   Gen: NAD, conversing comfortably CV: Regular heart rate Pulm: Normal work of breathing, normal respiratory rate *Sensitive exam performed entirely with CMA Sheri in the room the entire time.  No visualized pathologic external lesions, cervix was normal in appearance, there was no discharge visualized on speculum exam.  There was minor bleeding consistent with menses.  No abnormal sensitivity during exam. MSK: no edema, cyanosis, or clubbing noted Skin: warm, dry Neuro: grossly normal, moves all extremities Psych: Normal affect and thought content  Results for orders placed or performed in visit on 06/30/19 (from the past 72 hour(s))  POCT urine pregnancy     Status: None   Collection Time: 06/30/19  4:45 PM  Result Value Ref Range   Preg Test, Ur Negative Negative     Assessment/Plan:  Vaginal discharge Patient with complaint for the last few days of vaginal discharge, denies any dysuria, pelvic pain, abdominal pain, abnormal bleeding for her.  Says she is safe at home and no one is making her do anything she does not want to do.  Ordered urine pregnancy test and wet prep with gonorrhea chlamydia cytology, on physical exam there were no pathologic external lesions and no discharge visualized.  When lab went to run the test they said there was not enough of a sample on the swab to be able to even test.  Patient has been notified via my chart at her request    Sherene Sires, [place] - PGY3 07/01/2019 8:06 AM

## 2019-07-04 LAB — CERVICOVAGINAL ANCILLARY ONLY
Chlamydia: NEGATIVE
Comment: NEGATIVE
Comment: NEGATIVE
Comment: NORMAL
Neisseria Gonorrhea: NEGATIVE
Trichomonas: NEGATIVE

## 2019-07-22 ENCOUNTER — Other Ambulatory Visit: Payer: Self-pay

## 2019-07-22 ENCOUNTER — Encounter: Payer: Self-pay | Admitting: Advanced Practice Midwife

## 2019-07-22 ENCOUNTER — Ambulatory Visit (INDEPENDENT_AMBULATORY_CARE_PROVIDER_SITE_OTHER): Payer: Medicaid Other | Admitting: Advanced Practice Midwife

## 2019-07-22 VITALS — BP 123/73 | HR 90 | Wt 231.7 lb

## 2019-07-22 DIAGNOSIS — Z3046 Encounter for surveillance of implantable subdermal contraceptive: Secondary | ICD-10-CM | POA: Diagnosis not present

## 2019-07-22 NOTE — Patient Instructions (Signed)
Contraception Choices Contraception, also called birth control, refers to methods or devices that prevent pregnancy. Hormonal methods Contraceptive implant  A contraceptive implant is a thin, plastic tube that contains a hormone. It is inserted into the upper part of the arm. It can remain in place for up to 3 years. Progestin-only injections Progestin-only injections are injections of progestin, a synthetic form of the hormone progesterone. They are given every 3 months by a health care provider. Birth control pills  Birth control pills are pills that contain hormones that prevent pregnancy. They must be taken once a day, preferably at the same time each day. Birth control patch  The birth control patch contains hormones that prevent pregnancy. It is placed on the skin and must be changed once a week for three weeks and removed on the fourth week. A prescription is needed to use this method of contraception. Vaginal ring  A vaginal ring contains hormones that prevent pregnancy. It is placed in the vagina for three weeks and removed on the fourth week. After that, the process is repeated with a new ring. A prescription is needed to use this method of contraception. Emergency contraceptive Emergency contraceptives prevent pregnancy after unprotected sex. They come in pill form and can be taken up to 5 days after sex. They work best the sooner they are taken after having sex. Most emergency contraceptives are available without a prescription. This method should not be used as your only form of birth control. Barrier methods Female condom  A female condom is a thin sheath that is worn over the penis during sex. Condoms keep sperm from going inside a woman's body. They can be used with a spermicide to increase their effectiveness. They should be disposed after a single use. Female condom  A female condom is a soft, loose-fitting sheath that is put into the vagina before sex. The condom keeps sperm  from going inside a woman's body. They should be disposed after a single use. Diaphragm  A diaphragm is a soft, dome-shaped barrier. It is inserted into the vagina before sex, along with a spermicide. The diaphragm blocks sperm from entering the uterus, and the spermicide kills sperm. A diaphragm should be left in the vagina for 6-8 hours after sex and removed within 24 hours. A diaphragm is prescribed and fitted by a health care provider. A diaphragm should be replaced every 1-2 years, after giving birth, after gaining more than 15 lb (6.8 kg), and after pelvic surgery. Cervical cap  A cervical cap is a round, soft latex or plastic cup that fits over the cervix. It is inserted into the vagina before sex, along with spermicide. It blocks sperm from entering the uterus. The cap should be left in place for 6-8 hours after sex and removed within 48 hours. A cervical cap must be prescribed and fitted by a health care provider. It should be replaced every 2 years. Sponge  A sponge is a soft, circular piece of polyurethane foam with spermicide on it. The sponge helps block sperm from entering the uterus, and the spermicide kills sperm. To use it, you make it wet and then insert it into the vagina. It should be inserted before sex, left in for at least 6 hours after sex, and removed and thrown away within 30 hours. Spermicides Spermicides are chemicals that kill or block sperm from entering the cervix and uterus. They can come as a cream, jelly, suppository, foam, or tablet. A spermicide should be inserted into the   vagina with an applicator at least 10-15 minutes before sex to allow time for it to work. The process must be repeated every time you have sex. Spermicides do not require a prescription. Intrauterine contraception Intrauterine device (IUD) An IUD is a T-shaped device that is put in a woman's uterus. There are two types:  Hormone IUD.This type contains progestin, a synthetic form of the hormone  progesterone. This type can stay in place for 3-5 years.  Copper IUD.This type is wrapped in copper wire. It can stay in place for 10 years.  Permanent methods of contraception Female tubal ligation In this method, a woman's fallopian tubes are sealed, tied, or blocked during surgery to prevent eggs from traveling to the uterus. Hysteroscopic sterilization In this method, a small, flexible insert is placed into each fallopian tube. The inserts cause scar tissue to form in the fallopian tubes and block them, so sperm cannot reach an egg. The procedure takes about 3 months to be effective. Another form of birth control must be used during those 3 months. Female sterilization This is a procedure to tie off the tubes that carry sperm (vasectomy). After the procedure, the man can still ejaculate fluid (semen). Natural planning methods Natural family planning In this method, a couple does not have sex on days when the woman could become pregnant. Calendar method This means keeping track of the length of each menstrual cycle, identifying the days when pregnancy can happen, and not having sex on those days. Ovulation method In this method, a couple avoids sex during ovulation. Symptothermal method This method involves not having sex during ovulation. The woman typically checks for ovulation by watching changes in her temperature and in the consistency of cervical mucus. Post-ovulation method In this method, a couple waits to have sex until after ovulation. Summary  Contraception, also called birth control, means methods or devices that prevent pregnancy.  Hormonal methods of contraception include implants, injections, pills, patches, vaginal rings, and emergency contraceptives.  Barrier methods of contraception can include female condoms, female condoms, diaphragms, cervical caps, sponges, and spermicides.  There are two types of IUDs (intrauterine devices). An IUD can be put in a woman's uterus to  prevent pregnancy for 3-5 years.  Permanent sterilization can be done through a procedure for males, females, or both.  Natural family planning methods involve not having sex on days when the woman could become pregnant. This information is not intended to replace advice given to you by your health care provider. Make sure you discuss any questions you have with your health care provider. Document Released: 09/04/2005 Document Revised: 09/06/2017 Document Reviewed: 10/07/2016 Elsevier Patient Education  2020 Elsevier Inc.  

## 2019-07-22 NOTE — Progress Notes (Signed)
GYNECOLOGY OFFICE PROCEDURE NOTE  Marceil Welp is a 28 y.o. [phone] here for Nexplanon removal.  Last pap smear was on 03/2019 and was normal.  No other gynecologic concerns.   Nexplanon Removal Patient identified, informed consent performed, consent signed.   Appropriate time out taken. Nexplanon site identified.  Area prepped in usual sterile fashon. One ml of 1% lidocaine was used to anesthetize the area at the distal end of the implant. A small stab incision was made right beside the implant on the distal portion.  The Nexplanon rod was grasped using hemostats and removed without difficulty.  There was minimal blood loss. There were no complications.  3 ml of 1% lidocaine was injected around the incision for post-procedure analgesia.  Steri-strips were applied over the small incision.  A pressure bandage was applied to reduce any bruising.  The patient tolerated the procedure well and was given post procedure instructions.  Patient is planning to use abstinence for contraception. She is interested in Paragard.   Marcille Buffy DNP, CNM  07/22/19  11:35 AM

## 2019-07-22 NOTE — Progress Notes (Signed)
-  Has had Nexplanon for 4 months and c/o mood swings. States she has anxiety but has never been this bad for her. Menstruals every two weeks lasting  9-11 days each time. Heavy with no clots. Wants nexplanon removed today and to allow her body to get "Back to Normal"  Agrees to see Roselyn Reef ([place])

## 2019-07-29 ENCOUNTER — Ambulatory Visit: Payer: Medicaid Other | Admitting: Family Medicine

## 2019-07-30 ENCOUNTER — Ambulatory Visit: Payer: Medicaid Other | Admitting: Family Medicine

## 2019-07-31 ENCOUNTER — Encounter (HOSPITAL_COMMUNITY): Payer: Self-pay

## 2019-07-31 ENCOUNTER — Emergency Department (HOSPITAL_COMMUNITY)
Admission: EM | Admit: 2019-07-31 | Discharge: 2019-08-01 | Disposition: A | Discharge status: Other Acute Inpatient Hospital | Payer: Medicaid Other | Attending: Emergency Medicine | Admitting: Emergency Medicine

## 2019-07-31 ENCOUNTER — Other Ambulatory Visit: Payer: Self-pay

## 2019-07-31 DIAGNOSIS — Z20828 Contact with and (suspected) exposure to other viral communicable diseases: Secondary | ICD-10-CM | POA: Diagnosis not present

## 2019-07-31 DIAGNOSIS — R4182 Altered mental status, unspecified: Secondary | ICD-10-CM | POA: Diagnosis not present

## 2019-07-31 DIAGNOSIS — E119 Type 2 diabetes mellitus without complications: Secondary | ICD-10-CM | POA: Diagnosis not present

## 2019-07-31 DIAGNOSIS — Z87891 Personal history of nicotine dependence: Secondary | ICD-10-CM | POA: Insufficient documentation

## 2019-07-31 DIAGNOSIS — Z79899 Other long term (current) drug therapy: Secondary | ICD-10-CM | POA: Insufficient documentation

## 2019-07-31 DIAGNOSIS — F339 Major depressive disorder, recurrent, unspecified: Secondary | ICD-10-CM | POA: Insufficient documentation

## 2019-07-31 DIAGNOSIS — Z794 Long term (current) use of insulin: Secondary | ICD-10-CM | POA: Diagnosis not present

## 2019-07-31 DIAGNOSIS — R45851 Suicidal ideations: Secondary | ICD-10-CM | POA: Diagnosis not present

## 2019-07-31 LAB — COMPREHENSIVE METABOLIC PANEL
ALT: 13 U/L (ref 0–44)
AST: 20 U/L (ref 15–41)
Albumin: 4.1 g/dL (ref 3.5–5.0)
Alkaline Phosphatase: 56 U/L (ref 38–126)
Anion gap: 11 (ref 5–15)
BUN: 12 mg/dL (ref 6–20)
CO2: 22 mmol/L (ref 22–32)
Calcium: 9.3 mg/dL (ref 8.9–10.3)
Chloride: 103 mmol/L (ref 98–111)
Creatinine, Ser: 0.75 mg/dL (ref 0.44–1.00)
GFR calc Af Amer: >60 mL/min (ref >60)
GFR calc non Af Amer: >60 mL/min (ref >60)
Glucose, Bld: 86 mg/dL (ref 70–99)
Potassium: 3.4 mmol/L — ABNORMAL LOW (ref 3.5–5.1)
Sodium: 136 mmol/L (ref 135–145)
Total Bilirubin: 0.3 mg/dL (ref 0.3–1.2)
Total Protein: 8.2 g/dL — ABNORMAL HIGH (ref 6.5–8.1)

## 2019-07-31 LAB — ACETAMINOPHEN LEVEL: Acetaminophen (Tylenol), Serum: <10 ug/mL — ABNORMAL LOW (ref 10–30)

## 2019-07-31 LAB — CBC
HCT: 39.8 % (ref 36.0–46.0)
Hemoglobin: 11.6 g/dL — ABNORMAL LOW (ref 12.0–15.0)
MCH: 24 pg — ABNORMAL LOW (ref 26.0–34.0)
MCHC: 29.1 g/dL — ABNORMAL LOW (ref 30.0–36.0)
MCV: 82.2 fL (ref 80.0–100.0)
Platelets: 289 10*3/uL (ref 150–400)
RBC: 4.84 MIL/uL (ref 3.87–5.11)
RDW: 15.8 % — ABNORMAL HIGH (ref 11.5–15.5)
WBC: 7.3 10*3/uL (ref 4.0–10.5)
nRBC: 0 % (ref 0.0–0.2)

## 2019-07-31 LAB — I-STAT BETA HCG BLOOD, ED (MC, WL, AP ONLY): I-stat hCG, quantitative: <5 m[IU]/mL (ref <5)

## 2019-07-31 LAB — RAPID URINE DRUG SCREEN, HOSP PERFORMED
Amphetamines: NOT DETECTED
Barbiturates: NOT DETECTED
Benzodiazepines: NOT DETECTED
Cocaine: NOT DETECTED
Opiates: NOT DETECTED
Tetrahydrocannabinol: NOT DETECTED

## 2019-07-31 LAB — ETHANOL: Alcohol, Ethyl (B): <10 mg/dL (ref <10)

## 2019-07-31 LAB — SALICYLATE LEVEL: Salicylate Lvl: <7 mg/dL (ref 2.8–30.0)

## 2019-07-31 NOTE — ED Provider Notes (Signed)
[facility] DEPT Provider Note   CSN: [id number] Arrival date & time: 07/31/19  1806     History   Chief Complaint Chief Complaint  Patient presents with  . Suicidal    HPI Sandra [name] is a 28 y.o. female.     Patient complains that she is depressed and wants to hurt her self   Altered Mental Status Presenting symptoms: behavior changes   Severity:  Moderate Most recent episode:  Today Episode history:  Multiple Timing:  Constant Progression:  Worsening Chronicity:  New Context: not alcohol use   Associated symptoms: no hallucinations, no headaches, no rash and no seizures     Past Medical History:  Diagnosis Date  . Anemia   . Diabetes mellitus without complication ([place])   . GERD (gastroesophageal reflux disease)   . Hemorrhage after delivery of fetus    Intraoperative, received blood transfusion  . History of blood transfusion   . Hx of ectopic pregnancy     Patient Active Problem List   Diagnosis Date Noted  . COVID-19 virus infection 06/27/2019  . COVID-19 virus detected 06/18/2019  . Exposure to COVID-19 virus 06/16/2019  . Screening for malignant neoplasm of cervix 04/11/2019  . Lower abdominal pain 04/11/2019  . Wheezing 08/28/2018  . Vaginal discharge 07/07/2016  . Diabetes mellitus without complication, without long-term current use of insulin ([place]) 05/26/2016  . Anxiety state 04/28/2016  . GERD (gastroesophageal reflux disease) 04/28/2016  . Morbid obesity ([place]) 01/13/2014  . Halitosis 01/13/2014  . Implanon in place 01/13/2014  . Panic attacks 01/13/2014  . Encounter for screening examination for sexually transmitted disease 01/13/2014    Past Surgical History:  Procedure Laterality Date  . CESAREAN SECTION    . CESAREAN SECTION  04/23/2012   Procedure: CESAREAN SECTION;  Surgeon: Donnamae Jude, MD;  Location: [facility];  Service: Gynecology;  Laterality: N/A;  . CHOLECYSTECTOMY  10/23/2012   Procedure:  LAPAROSCOPIC CHOLECYSTECTOMY WITH INTRAOPERATIVE CHOLANGIOGRAM;  Surgeon: Pedro Earls, MD;  Location: [facility];  Service: General;  Laterality: N/A;  . LAPAROSCOPY FOR ECTOPIC PREGNANCY       OB History    Gravida  4   Para  2   Term  2   Preterm      AB  1   Living  2     SAB      TAB      Ectopic  1   Multiple      Live Births  2            Home Medications    Prior to Admission medications   Medication Sig Start Date End Date Taking? Authorizing Provider  Dulaglutide 0.75 MG/0.5ML SOPN Inject 0.75 mg into the skin once a week. 04/16/19  Yes Meccariello, Bernita Raisin, DO  fluticasone (FLONASE) 50 MCG/ACT nasal spray Place 1 spray into both nostrils daily. 06/25/18  Yes Glenis Smoker, MD  metFORMIN (GLUCOPHAGE) 1000 MG tablet Take 1 tablet (1,000 mg total) by mouth 2 (two) times daily with a meal. 06/25/18  Yes Glenis Smoker, MD  omeprazole (PRILOSEC) 40 MG capsule TAKE 1 CAPSULE BY MOUTH EVERY DAY Patient taking differently: Take 40 mg by mouth daily.  07/01/19  Yes Bland, Scott, DO  PAZEO 0.7 % SOLN Place 1 drop into both eyes daily. 05/09/19  Yes [provider]  RESTASIS 0.05 % ophthalmic emulsion Place 1 drop into both eyes 2 (two) times daily. 05/30/19  Yes [provider]  albuterol (VENTOLIN HFA) 108 (90 Base) MCG/ACT inhaler Inhale 1-2 puffs into the lungs every 6 (six) hours as needed for wheezing or shortness of breath. Patient not taking: Reported on 07/31/2019 06/17/19   Alveria Apley, PA-C  benzonatate (TESSALON) 100 MG capsule Take 1 capsule (100 mg total) by mouth every 8 (eight) hours. Patient not taking: Reported on 07/31/2019 06/17/19   Madilyn Hook A, PA-C  ferrous sulfate 325 (65 FE) MG tablet Take 1 tablet (325 mg total) by mouth every other day. Patient not taking: Reported on 07/31/2019 06/28/18   Glenis Smoker, MD  glucose blood (ACCU-CHEK GUIDE) test strip Use to check blood sugar once daily or as  instructed. 09/19/18   Leeanne Rio, MD  glucose monitoring kit (FREESTYLE) monitoring kit 1 each by Does not apply route as needed for other. 04/11/19   Meccariello, Bernita Raisin, DO  guaiFENesin-codeine 100-10 MG/5ML syrup Take 5 mLs by mouth every 6 (six) hours as needed for cough. Patient not taking: Reported on 07/31/2019 08/01/18   McDiarmid, Blane Ohara, MD  Insulin Pen Needle 32G X 4 MM MISC 1 Piece by Does not apply route daily. 04/15/19   Meccariello, Bernita Raisin, DO  Lancets Misc. (ACCU-CHEK FASTCLIX LANCET) KIT 1 Piece by Does not apply route 2 (two) times daily. 04/14/19   Meccariello, Bernita Raisin, DO    Family History Family History  Problem Relation Age of Onset  . High blood pressure Mother   . Aneurysm Mother   . Hypertension Mother   . Drug abuse Mother   . Diabetes Mellitus II Father   . Hyperlipidemia Father   . Alcohol abuse Father   . Heart disease Maternal Aunt   . Diabetes Paternal Grandmother   . Diabetes Paternal Grandfather   . Other Neg Hx     Social History Social History   Tobacco Use  . Smoking status: Former Research scientist (life sciences)  . Smokeless tobacco: Never Used  Substance Use Topics  . Alcohol use: Yes    Comment: occasionally  . Drug use: No     Allergies   Shellfish allergy   Review of Systems Review of Systems  Constitutional: Negative for appetite change and fatigue.  HENT: Negative for congestion, ear discharge and sinus pressure.   Eyes: Negative for discharge.  Respiratory: Negative for cough.   Cardiovascular: Negative for chest pain.  Gastrointestinal: Negative for diarrhea.  Genitourinary: Negative for frequency and hematuria.  Musculoskeletal: Negative for back pain.  Skin: Negative for rash.  Neurological: Negative for seizures and headaches.  Psychiatric/Behavioral: Positive for dysphoric mood. Negative for hallucinations.     Physical Exam Updated Vital Signs BP (!) 140/91 (BP Location: Left Arm)   Pulse 92   Temp 98.4 F (36.9 C)  (Oral)   Resp 20   Ht '4\' 11"'  (1.499 m)   Wt 105 kg   LMP 07/28/2019   SpO2 99%   BMI 46.75 kg/m   Physical Exam Vitals signs and nursing note reviewed.  Constitutional:      Appearance: She is well-developed.  HENT:     Head: Normocephalic.     Nose: Nose normal.  Eyes:     General: No scleral icterus.    Conjunctiva/sclera: Conjunctivae normal.  Neck:     Musculoskeletal: Neck supple.     Thyroid: No thyromegaly.  Cardiovascular:     Rate and Rhythm: Normal rate and regular rhythm.     Heart sounds: No murmur. No friction rub. No  gallop.   Pulmonary:     Breath sounds: No stridor. No wheezing or rales.  Chest:     Chest wall: No tenderness.  Abdominal:     General: There is no distension.     Tenderness: There is no abdominal tenderness. There is no rebound.  Musculoskeletal: Normal range of motion.  Lymphadenopathy:     Cervical: No cervical adenopathy.  Skin:    Findings: No erythema or rash.  Neurological:     Mental Status: She is oriented to person, place, and time.     Motor: No abnormal muscle tone.     Coordination: Coordination normal.  Psychiatric:     Comments: Depressed and suicidal      ED Treatments / Results  Labs (all labs ordered are listed, but only abnormal results are displayed) Labs Reviewed  COMPREHENSIVE METABOLIC PANEL - Abnormal; Notable for the following components:      Result Value   Potassium 3.4 (*)    Total Protein 8.2 (*)    All other components within normal limits  ACETAMINOPHEN LEVEL - Abnormal; Notable for the following components:   Acetaminophen (Tylenol), Serum <10 (*)    All other components within normal limits  CBC - Abnormal; Notable for the following components:   Hemoglobin 11.6 (*)    MCH 24.0 (*)    MCHC 29.1 (*)    RDW 15.8 (*)    All other components within normal limits  SARS CORONAVIRUS 2 (TAT 6-24 HRS)  ETHANOL  SALICYLATE LEVEL  RAPID URINE DRUG SCREEN, HOSP PERFORMED  I-STAT BETA HCG BLOOD, ED  ([facility], [facility], [facility] ONLY)    EKG None  Radiology No results found.  Procedures Procedures (including critical care time)  Medications Ordered in ED Medications - No data to display   Initial Impression / Assessment and Plan / ED Course  I have reviewed the triage vital signs and the nursing notes.  Pertinent labs & imaging results that were available during my care of the patient were reviewed by me and considered in my medical decision making (see chart for details).    CRITICAL CARE Performed by: Milton Ferguson Total critical care time: 35 minutes Critical care time was exclusive of separately billable procedures and treating other patients. Critical care was necessary to treat or prevent imminent or life-threatening deterioration. Critical care was time spent personally by me on the following activities: development of treatment plan with patient and/or surrogate as well as nursing, discussions with consultants, evaluation of patient's response to treatment, examination of patient, obtaining history from patient or surrogate, ordering and performing treatments and interventions, ordering and review of laboratory studies, ordering and review of radiographic studies, pulse oximetry and re-evaluation of patient's condition.      Labs unremarkable.  Patient was seen by behavioral health and it is recommended that she gets inpatient treatment for the depression and suicidal ideation Final Clinical Impressions(s) / ED Diagnoses   Final diagnoses:  Suicidal ideation    ED Discharge Orders    None       Milton Ferguson, MD 07/31/19 2217

## 2019-07-31 NOTE — BHH Counselor (Signed)
Clinician called back and noted, per Jenny Reichmann, RN, NT is looking for the TTS cart. Once TTS cart is located clinician to complete TTS assessment.   Vertell Novak, [place], [facility], [facility] Triage Specialist [phone]

## 2019-07-31 NOTE — ED Notes (Signed)
TSS in room  

## 2019-07-31 NOTE — ED Notes (Signed)
TSS to evaluate in 30 mins

## 2019-07-31 NOTE — BHH Counselor (Signed)
Sandra Grumbling, NP recommends inpatient treatment. TTS to seek placement. Disposition discussed with Dr. Roderic Palau and Jenny Reichmann, RN. TTS to seek placement.     Vertell Novak, [place], [facility], [facility] Triage Specialist [phone]

## 2019-07-31 NOTE — ED Triage Notes (Signed)
PER [facility]- PT ADMITS TO SI. DENIES HI. SI X 1 MONTH HOWEVER INCREASED TODAY HAS THOUGHTS OF "STABBING HERSELF OR INGESTING PILLS" NO REAL PLAN. NO HX IN THE PAST.

## 2019-07-31 NOTE — BHH Counselor (Signed)
Clinician spoke to Jenny Reichmann, RN and noted he has to get blood from the pt and for clinician to call back in 30 minutes.    Vertell Novak, MS, [facility], [facility] Triage Specialist [phone].

## 2019-07-31 NOTE — BHH Counselor (Signed)
Clinician called back, per Jenny Reichmann, RN pt is in the restroom. Clinician to call back in 15 minutes.    Vertell Novak, [place], [facility], [facility] Triage Specialist [phone]

## 2019-07-31 NOTE — BHH Counselor (Signed)
Clinician spoke to [place], [place] and noted the pt has a visitor that is in  the process of leaving. Clinician to call back in 5 minutes. Katrina, NT discussed with Jenny Reichmann, Therapist, sports.    Vertell Novak, [place], [facility], [facility] Triage Specialist [phone]

## 2019-07-31 NOTE — BHH Counselor (Addendum)
Pt consented to have clinician to call her sister Laterria [place], [phone]). Per sister, the pt is going through a rough break up from a 8 year relationship. Per sister, the pt is crying a lot. Pt's sister reported, today, the pt told her she didn't want to be here. Per sister, she was scared in that moment of not doing something, so she called the ambulance. Pt's sister reported, she feels the pt will be safe if discharged and thinks the pt needs therapy and medications. Per sister, before the break up the pt was fine and she is dating a new guy.    Vertell Novak, [place], [facility], [facility] Triage Specialist [phone]

## 2019-07-31 NOTE — BH Assessment (Signed)
Tele Assessment Note   Patient Name: Sandra [name] MRN: [id number] Referring Physician: Dr. Bethann Berkshire.  Location of Patient: [facility], [phone]. Location of Provider: Behavioral Health TTS Department  Sandra [name] is an 28 y.o. female, who presents voluntary and unaccompanied to [facility]. Clinician asked the pt, "what brought you to the hospital?" Pt reported, "I don't want to be here anymore, physically." Pt reported, she found out her ex-boyfriend was engaged on Facebook which caused her to have suicidal thoughts. Pt reported, she has a plan to stabbing herself in the chest. Pt reported, she has been depressed for years but since learning ex is engaged her depression has increased. Pt reported, today she grabbed a knife but put is down when she thought of her daughter. Pt reported, telling her sister about her suicidal thoughts, her sister called 911 and she was taken to the hospital. Pt reported, she can not stop crying. Pt listed other stressors that includes: relationship problems with current boyfriend and workplace harrassment. Pt denies, HI, AVH, self-injurious behaviors and access to weapons.   Pt denies, abuse and substance use. Pt's UDS is pending. Pt has restarted counseling with Sandra [name]. Pt reported, her most recent session was Monday. Pt denies, being linked to a psychiatrist. Pt denies, previous inpatient admissions.  Pt presents tearful at times, in scrubs with logical, coherent speech. Pt's eye contact was fair. Pt's mood, affect was depressed. Pt's thought process was coherent, relevant. Pt's judgement was impaired. Pt's concentration was normal. Pt's insight and impulse control was fair. Pt reported, if discharged from [facility] she can not contract for safety. Clinician discussed the three possible dispositions (discharged with OPT resources, observe/reassess by psychiatry or inpatient treatment) in detail. Pt reported, if inpatient treatment is recommended she will sign-in  voluntarily.   Diagnosis: Major Depressive Disorder, recurrent, severe without psychotic features.   Past Medical History:  Past Medical History:  Diagnosis Date  . Anemia   . Diabetes mellitus without complication (HCC)   . GERD (gastroesophageal reflux disease)   . Hemorrhage after delivery of fetus    Intraoperative, received blood transfusion  . History of blood transfusion   . Hx of ectopic pregnancy     Past Surgical History:  Procedure Laterality Date  . CESAREAN SECTION    . CESAREAN SECTION  04/23/2012   Procedure: CESAREAN SECTION;  Surgeon: Reva Bores, MD;  Location: [facility] ORS;  Service: Gynecology;  Laterality: N/A;  . CHOLECYSTECTOMY  10/23/2012   Procedure: LAPAROSCOPIC CHOLECYSTECTOMY WITH INTRAOPERATIVE CHOLANGIOGRAM;  Surgeon: Valarie Merino, MD;  Location: [facility] ORS;  Service: General;  Laterality: N/A;  . LAPAROSCOPY FOR ECTOPIC PREGNANCY      Family History:  Family History  Problem Relation Age of Onset  . High blood pressure Mother   . Aneurysm Mother   . Hypertension Mother   . Drug abuse Mother   . Diabetes Mellitus II Father   . Hyperlipidemia Father   . Alcohol abuse Father   . Heart disease Maternal Aunt   . Diabetes Paternal Grandmother   . Diabetes Paternal Grandfather   . Other Neg Hx     Social History:  reports that she has quit smoking. She has never used smokeless tobacco. She reports current alcohol use. She reports that she does not use drugs.  Additional Social History:  Alcohol / Drug Use Pain Medications: See MAR Prescriptions: See MAR Over the Counter: See MAR History of alcohol / drug use?: No history of alcohol / drug  abuse(Pt denies. UDS is pending.)  CIWA: CIWA-Ar BP: (!) 140/91 Pulse Rate: 92 COWS:    Allergies:  Allergies  Allergen Reactions  . Shellfish Allergy Anaphylaxis    Home Medications: (Not in a hospital admission)   OB/GYN Status:  Patient's last menstrual period was 07/28/2019.  General Assessment  Data Assessment unable to be completed: Yes Reason for not completing assessment: Clinician spoke to Sandra Reichmann, RN and noted he has to get blood from the pt and for clinician to call back in 30 minutes.  Location of Assessment: [facility] ED TTS Assessment: In system Is this a Tele or Face-to-Face Assessment?: Tele Assessment Is this an Initial Assessment or a Re-assessment for this encounter?: Initial Assessment Patient Accompanied by:: N/A Language Other than English: No Living Arrangements: Other (Comment)(someone. ) What gender do you identify as?: Female Marital status: Single Living Arrangements: Non-relatives/Friends Can pt return to current living arrangement?: Yes Admission Status: Voluntary Is patient capable of signing voluntary admission?: Yes Referral Source: Self/Family/Friend Insurance type: Medicaid.      Crisis Care Plan Living Arrangements: Non-relatives/Friends Legal Guardian: Other:(Self. ) Name of Psychiatrist: NA Name of Therapist: Mrs. Sandra [name].   Education Status Is patient currently in school?: No Is the patient employed, unemployed or receiving disability?: Employed  Risk to self with the past 6 months Suicidal Ideation: Yes-Currently Present Has patient been a risk to self within the past 6 months prior to admission? : Yes Suicidal Intent: Yes-Currently Present Has patient had any suicidal intent within the past 6 months prior to admission? : Yes Is patient at risk for suicide?: Yes Suicidal Plan?: Yes-Currently Present Has patient had any suicidal plan within the past 6 months prior to admission? : Yes Specify Current Suicidal Plan: Pt reported, to stab herself in the chest.  Access to Means: Yes Specify Access to Suicidal Means: Knives, What has been your use of drugs/alcohol within the last 12 months?: Pt denies. UDS is pending.  Previous Attempts/Gestures: No(Pt denies.) How many times?: 0 Other Self Harm Risks: NA Triggers for Past Attempts: None  known Intentional Self Injurious Behavior: None(Pt denies.) Family Suicide History: No Recent stressful life event(s): Other (Comment)(Ex is engaged, relationship problems,work place harrassment ) Persecutory voices/beliefs?: No Depression: Yes Depression Symptoms: Feeling angry/irritable, Feeling worthless/self pity, Loss of interest in usual pleasures, Guilt, Fatigue, Isolating, Tearfulness, Insomnia, Despondent Substance abuse history and/or treatment for substance abuse?: No Suicide prevention information given to non-admitted patients: Not applicable  Risk to Others within the past 6 months Homicidal Ideation: No(Pt denies.) Does patient have any lifetime risk of violence toward others beyond the six months prior to admission? : No Thoughts of Harm to Others: No(Pt denies.) Current Homicidal Intent: No Current Homicidal Plan: No Access to Homicidal Means: No Identified Victim: NA History of harm to others?: No(Pt denies.) Assessment of Violence: None Noted Violent Behavior Description: NA Does patient have access to weapons?: No(Pt denies.) Criminal Charges Pending?: No Does patient have a court date: No Is patient on probation?: No  Psychosis Hallucinations: None noted(Pt denies.) Delusions: None noted(Pt denies.)  Mental Status Report Appearance/Hygiene: In scrubs Eye Contact: Fair Motor Activity: Unremarkable Speech: Logical/coherent Level of Consciousness: Other (Comment)(Tearful at times. ) Mood: Depressed Affect: Depressed Anxiety Level: None Thought Processes: Coherent, Relevant Judgement: Impaired Orientation: Person, Place, Time, Situation Obsessive Compulsive Thoughts/Behaviors: None  Cognitive Functioning Concentration: Normal Memory: Recent Intact Is patient IDD: No Insight: Fair Impulse Control: Fair Appetite: ((up and down) ) Sleep: Decreased Total Hours of Sleep: 3  Vegetative Symptoms: Staying in bed, Not bathing, Decreased  grooming  ADLScreening [facility] Assessment Services) Patient's cognitive ability adequate to safely complete daily activities?: Yes Patient able to express need for assistance with ADLs?: Yes Independently performs ADLs?: Yes (appropriate for developmental age)  Prior Inpatient Therapy Prior Inpatient Therapy: No  Prior Outpatient Therapy Prior Outpatient Therapy: Yes Prior Therapy Dates: Current.  Prior Therapy Facilty/Provider(s): Sandra [name]. Reason for Treatment: Counseling.  Does patient have an ACCT team?: No Does patient have Intensive In-House Services?  : No Does patient have [facility] services? : No Does patient have P4CC services?: No  ADL Screening (condition at time of admission) Patient's cognitive ability adequate to safely complete daily activities?: Yes Is the patient deaf or have difficulty hearing?: No Does the patient have difficulty seeing, even when wearing glasses/contacts?: Yes(Pt wears glasses.) Does the patient have difficulty concentrating, remembering, or making decisions?: No Patient able to express need for assistance with ADLs?: Yes Does the patient have difficulty dressing or bathing?: No Independently performs ADLs?: Yes (appropriate for developmental age) Does the patient have difficulty walking or climbing stairs?: No Weakness of Legs: None Weakness of Arms/Hands: None  Home Assistive Devices/Equipment Home Assistive Devices/Equipment: Eyeglasses    Abuse/Neglect Assessment (Assessment to be complete while patient is alone) Abuse/Neglect Assessment Can Be Completed: Yes Physical Abuse: Denies(Pt denies.) Verbal Abuse: Denies(Pt denies.) Sexual Abuse: Denies(Pt denies.) Exploitation of patient/patient's resources: Denies(Pt denies.) Self-Neglect: Denies(Pt denies.)     Advance Directives (For Healthcare) Does Patient Have a Medical Advance Directive?: No Would patient like information on creating a medical advance directive?: No - Guardian  declined          Disposition: Pending.    Disposition Initial Assessment Completed for this Encounter: Yes  This service was provided via telemedicine using a 2-way, interactive audio and video technology.  Names of all persons participating in this telemedicine service and their role in this encounter. Name: Sandra [name]. Role: Patient.   Name: Sandra Pullingreylese D Gershon Shorten, MS, [facility], [facility]. Role: Counselor.           Sandra [name] 07/31/2019 9:19 PM.    Sandra Pullingreylese D Nykiah Ma, MS, [facility], [facility] Triage Specialist [phone]

## 2019-08-01 ENCOUNTER — Emergency Department (HOSPITAL_COMMUNITY): Payer: Medicaid Other

## 2019-08-01 LAB — CBG MONITORING, ED: Glucose-Capillary: 100 mg/dL — ABNORMAL HIGH (ref 70–99)

## 2019-08-01 LAB — SARS CORONAVIRUS 2 (TAT 6-24 HRS): SARS Coronavirus 2: NEGATIVE

## 2019-08-01 MED ORDER — PANTOPRAZOLE SODIUM 40 MG PO TBEC
40.0000 mg | DELAYED_RELEASE_TABLET | Freq: Every day | ORAL | Status: DC
  Administered 2019-08-01: 40 mg via ORAL
  Filled 2019-08-01: qty 1

## 2019-08-01 MED ORDER — METFORMIN HCL 500 MG PO TABS: 1000.0000 mg | ORAL_TABLET | Freq: Two times a day (BID) | ORAL | Status: DC

## 2019-08-01 NOTE — Progress Notes (Signed)
Received Sandra [name] from [facility] at 0413 hrs, she was oriented to her new environment, given water and watching TV in her room. She drifted off to sleep and slept the remainder of the night.

## 2019-08-01 NOTE — BH Assessment (Addendum)
Patient accepted to [facility]. Notified Waunita Schooner who will inform nursing staff at [facility] of patient's acceptance to [facility]. Details are noted in the chart by Calvin.

## 2019-08-01 NOTE — ED Notes (Signed)
Pt A&O x 4, resting at present, no distress noted,calm & cooperative.  Monitoring for safety.  Talking on phone at intervals.

## 2019-08-01 NOTE — ED Notes (Signed)
Pt requesting something to help her sleep. Darryll Capers, RN made aware.

## 2019-08-01 NOTE — BH Assessment (Signed)
Patient has been accepted to [facility].  Accepted by Psych Nurse Practitioner, Marvia Pickles, on the behalf of Dr. Weber Cooks.  Attending Physician will be Dr. Weber Cooks.  Patient has been assigned to room 315, by [place] Charge Nurse Demetria.  Call report to [phone].  Representative/Transfer Coordinator is Hermon Zea Patient pre-admitted by [facility] Patient Access Gust Rung.)  [facility] Staff Marlou Porch, TTS Disposition) made aware of acceptance.  Patient bed will be available anytime after 8:30pm.

## 2019-08-01 NOTE — ED Notes (Signed)
Safe Transport Requested, pending pick up in 30-35 min.

## 2019-08-01 NOTE — ED Notes (Signed)
Brian, MHT given handoff report 

## 2019-08-01 NOTE — ED Notes (Signed)
Pt ambulatory in hallway with steady gait.

## 2019-08-01 NOTE — ED Notes (Signed)
Report called to [facility] accepted report.  Comptroller to Facility.

## 2019-08-01 NOTE — BH Assessment (Signed)
[facility] Assessment Progress Note  Per Letitia Libra, FNP, this voluntary pt requires psychiatric hospitalization.  Ian Malkin, Counselor reports that pt has been accepted to [facility] by Marvia Pickles, NP with room assignment pending.  Pt's nurse, Nena Jordan, has been informed, and agrees to have pt sign Voluntary Admission and Consent for Treatment, as well as Consent to Release Information.  She will then call Calvin at [phone] to notify him, and will fax consents to [phone].  Please note that Otila Kluver opines that pt meets criteria for IVC if necessary.  Nena Jordan will call report to [phone].  Pt is to be transported via [facility] if voluntary, and by [facility] if involuntary.     Jalene Mullet, [place] Coordinator [phone]

## 2019-08-02 ENCOUNTER — Inpatient Hospital Stay
Admission: RE | Admit: 2019-08-02 | Discharge: 2019-08-05 | DRG: 885 | Disposition: A | Discharge status: Home / Self Care | Payer: Medicaid Other | Source: Intra-hospital | Attending: Psychiatry | Admitting: Psychiatry

## 2019-08-02 ENCOUNTER — Other Ambulatory Visit: Payer: Self-pay

## 2019-08-02 DIAGNOSIS — G4700 Insomnia, unspecified: Secondary | ICD-10-CM | POA: Diagnosis present

## 2019-08-02 DIAGNOSIS — F332 Major depressive disorder, recurrent severe without psychotic features: Secondary | ICD-10-CM | POA: Diagnosis not present

## 2019-08-02 DIAGNOSIS — K219 Gastro-esophageal reflux disease without esophagitis: Secondary | ICD-10-CM | POA: Diagnosis present

## 2019-08-02 DIAGNOSIS — E119 Type 2 diabetes mellitus without complications: Secondary | ICD-10-CM | POA: Diagnosis present

## 2019-08-02 DIAGNOSIS — F411 Generalized anxiety disorder: Secondary | ICD-10-CM | POA: Diagnosis present

## 2019-08-02 DIAGNOSIS — Z87891 Personal history of nicotine dependence: Secondary | ICD-10-CM | POA: Diagnosis not present

## 2019-08-02 DIAGNOSIS — J45909 Unspecified asthma, uncomplicated: Secondary | ICD-10-CM | POA: Diagnosis present

## 2019-08-02 DIAGNOSIS — D509 Iron deficiency anemia, unspecified: Secondary | ICD-10-CM | POA: Diagnosis present

## 2019-08-02 DIAGNOSIS — I10 Essential (primary) hypertension: Secondary | ICD-10-CM | POA: Diagnosis present

## 2019-08-02 DIAGNOSIS — F339 Major depressive disorder, recurrent, unspecified: Secondary | ICD-10-CM | POA: Diagnosis present

## 2019-08-02 DIAGNOSIS — R45851 Suicidal ideations: Secondary | ICD-10-CM | POA: Diagnosis present

## 2019-08-02 DIAGNOSIS — F4325 Adjustment disorder with mixed disturbance of emotions and conduct: Secondary | ICD-10-CM | POA: Diagnosis not present

## 2019-08-02 LAB — GLUCOSE, CAPILLARY
Glucose-Capillary: 112 mg/dL — ABNORMAL HIGH (ref 70–99)
Glucose-Capillary: 117 mg/dL — ABNORMAL HIGH (ref 70–99)
Glucose-Capillary: 120 mg/dL — ABNORMAL HIGH (ref 70–99)

## 2019-08-02 MED ORDER — VITAMIN B-12 1000 MCG PO TABS
500.0000 ug | ORAL_TABLET | Freq: Every day | ORAL | Status: DC
  Administered 2019-08-02 – 2019-08-05 (×4): 500 ug via ORAL
  Filled 2019-08-02 (×4): qty 1

## 2019-08-02 MED ORDER — ALBUTEROL SULFATE HFA 108 (90 BASE) MCG/ACT IN AERS
1.0000 | INHALATION_SPRAY | Freq: Four times a day (QID) | RESPIRATORY_TRACT | Status: DC | PRN
  Filled 2019-08-02: qty 6.7

## 2019-08-02 MED ORDER — ALUM & MAG HYDROXIDE-SIMETH 200-200-20 MG/5ML PO SUSP: 30.0000 mL | ORAL | Status: DC | PRN

## 2019-08-02 MED ORDER — HYDROXYZINE HCL 25 MG PO TABS: 25.0000 mg | ORAL_TABLET | Freq: Three times a day (TID) | ORAL | Status: DC | PRN

## 2019-08-02 MED ORDER — MAGNESIUM HYDROXIDE 400 MG/5ML PO SUSP: 30.0000 mL | Freq: Every day | ORAL | Status: DC | PRN

## 2019-08-02 MED ORDER — ACETAMINOPHEN 325 MG PO TABS: 650.0000 mg | ORAL_TABLET | Freq: Four times a day (QID) | ORAL | Status: DC | PRN

## 2019-08-02 MED ORDER — PANTOPRAZOLE SODIUM 40 MG PO TBEC
40.0000 mg | DELAYED_RELEASE_TABLET | Freq: Every day | ORAL | Status: DC
  Administered 2019-08-02 – 2019-08-05 (×4): 40 mg via ORAL
  Filled 2019-08-02 (×4): qty 1

## 2019-08-02 MED ORDER — SERTRALINE HCL 25 MG PO TABS
25.0000 mg | ORAL_TABLET | Freq: Every day | ORAL | Status: DC
  Administered 2019-08-02 – 2019-08-05 (×4): 25 mg via ORAL
  Filled 2019-08-02 (×4): qty 1

## 2019-08-02 MED ORDER — TRAZODONE HCL 50 MG PO TABS: 50.0000 mg | ORAL_TABLET | Freq: Every evening | ORAL | Status: DC | PRN

## 2019-08-02 MED ORDER — METFORMIN HCL 500 MG PO TABS
1000.0000 mg | ORAL_TABLET | Freq: Two times a day (BID) | ORAL | Status: DC
  Administered 2019-08-02 – 2019-08-05 (×6): 1000 mg via ORAL
  Filled 2019-08-02 (×6): qty 2

## 2019-08-02 MED ORDER — FERROUS SULFATE 325 (65 FE) MG PO TABS
325.0000 mg | ORAL_TABLET | Freq: Every day | ORAL | Status: DC
  Administered 2019-08-03 – 2019-08-05 (×3): 325 mg via ORAL
  Filled 2019-08-02 (×3): qty 1

## 2019-08-02 NOTE — BHH Group Notes (Signed)
[facility] [facility] Group Notes:  (Nursing/MHT/Case Management/Adjunct)  Date:  08/02/2019  Time:  9:11 PM  Type of Therapy:  Group Therapy  Participation Level:  Did Not Attend  Summary of Progress/Problems:  Sandra [name] 08/02/2019, 9:11 PM

## 2019-08-02 NOTE — Progress Notes (Signed)
Pt has been calm, cooperative and pleasant today. Pt has been sociable. Pt says that she enjoyed being outside. Collier Bullock RN

## 2019-08-02 NOTE — BHH Suicide Risk Assessment (Signed)
[facility] Admission Suicide Risk Assessment   Nursing information obtained from:  Patient Demographic factors:  Adolescent or young adult Current Mental Status:  NA Loss Factors:  Decline in physical health, Loss of significant relationship Historical Factors:  Impulsivity Risk Reduction Factors:  Religious beliefs about death  Total Time spent with patient: 30 minutes Principal Problem: <principal problem not specified> Diagnosis:  Active Problems:   * No active hospital problems. *  Subjective Data: Patient is seen and examined.  Patient is a 28 year old female who presented to the [facility] emergency department on 07/31/2019 with suicidal ideation.  The patient stated that she recently found out that her boyfriend was engaged to another female.  She found this out from the Internet.  She stated that she had gotten acutely depressed and acutely anxious and grabbed a knife but put it down.  She has 2 children and her daughter lives with her, and she did not want to hurt her self.  She did tell her sister that she did not "not want to be around".  Her sister called 59 and she was taken to the hospital for evaluation.  The patient was quite tearful and sad in the emergency room.  She discussed the issues above.  Decision was made to admit her to the hospital.  She was transferred to our facility.  She stated that she felt guilty and anxious about all that it occurred.  She began to doubt herself.  This became overwhelming.  The patient admitted that she had previously been in treatment in therapy for anxiety and some obsessional type thinking.  She stated that that went on for a while, but then she stopped it when she was feeling better.  She denied any previous treatment with medications, she denied any previous suicide attempts, she denied any past family history of treatment or suicide attempts.  She denied current suicidal ideation.  She was admitted to the hospital for evaluation  and stabilization.  Continued Clinical Symptoms:  Alcohol Use Disorder Identification Test Final Score (AUDIT): 0 The "Alcohol Use Disorders Identification Test", Guidelines for Use in Primary Care, Second Edition.  World Pharmacologist [facility]). Score between 0-7:  no or low risk or alcohol related problems. Score between 8-15:  moderate risk of alcohol related problems. Score between 16-19:  high risk of alcohol related problems. Score 20 or above:  warrants further diagnostic evaluation for alcohol dependence and treatment.   CLINICAL FACTORS:   Severe Anxiety and/or Agitation Depression:   Anhedonia Hopelessness Impulsivity Insomnia   Musculoskeletal: Strength & Muscle Tone: within normal limits Gait & Station: normal Patient leans: N/A  Psychiatric Specialty Exam: Physical Exam  Constitutional: She is oriented to person, place, and time. She appears well-developed and well-nourished.  HENT:  Head: Normocephalic and atraumatic.  Respiratory: Effort normal.  Neurological: She is alert and oriented to person, place, and time.    ROS  Blood pressure (!) 130/91, pulse (!) 110, temperature 98.7 F (37.1 C), temperature source Oral, resp. rate 17, height 4\' 11"  (1.499 m), weight 101.6 kg, last menstrual period 07/28/2019, SpO2 100 %.Body mass index is 45.24 kg/m.  General Appearance: Casual  Eye Contact:  Good  Speech:  Normal Rate  Volume:  Normal  Mood:  Anxious  Affect:  Congruent  Thought Process:  Coherent and Descriptions of Associations: Intact  Orientation:  Full (Time, Place, and Person)  Thought Content:  Logical  Suicidal Thoughts:  No  Homicidal Thoughts:  No  Memory:  Immediate;   Fair Recent;   Fair Remote;   Fair  Judgement:  Intact  Insight:  Good  Psychomotor Activity:  Increased  Concentration:  Concentration: Fair and Attention Span: Fair  Recall:  [employer] of Knowledge:  Fair  Language:  Good  Akathisia:  Negative  Handed:  Right  AIMS  (if indicated):     Assets:  Desire for Improvement Housing Resilience Social Support Talents/Skills  ADL's:  Intact  Cognition:  WNL  Sleep:  Number of Hours: 3.5      COGNITIVE FEATURES THAT CONTRIBUTE TO RISK:  None    SUICIDE RISK:   Mild:  Suicidal ideation of limited frequency, intensity, duration, and specificity.  There are no identifiable plans, no associated intent, mild dysphoria and related symptoms, good self-control (both objective and subjective assessment), few other risk factors, and identifiable protective factors, including available and accessible social support.  PLAN OF CARE: Patient is seen and examined.  Patient is a 28 year old female with the above-stated past psychiatric history who was admitted to the hospital secondary to suicidal ideation.  She will be admitted to the hospital.  She will be integrated into the milieu.  She will be encouraged to attend groups and work on her coping skills.  She has not been treated with medications before for anxiety or depression.  She will be started on Zoloft 25 mg p.o. daily, and this will be titrated throughout the course of the hospitalization.  She will also have available hydroxyzine 25 mg p.o. every 6 hours as needed anxiety as well as trazodone 50 mg p.o. nightly as needed insomnia.  She does have a history of reflux disease, and I will place her on Protonix while she is in the hospital.  Review of her laboratories showed a mildly low potassium at 3.4, a mildly low hemoglobin 11.6.  Her MCH and MCHC were slightly low, and her RDW was mildly elevated.  She does have a history of anemia.  Drug screen was negative, and her beta-hCG was negative as well.  Her blood pressure slightly elevated 130/91 and this will be monitored.  She was mildly tachycardic with a rate of 110 today.  EKG was not obtained, but I will order that we will review that.  I certify that inpatient services furnished can reasonably be expected to improve  the patient's condition.   Antonieta Pert, MD 08/02/2019, 10:02 AM

## 2019-08-02 NOTE — [facility] Counselor (Signed)
Adult Comprehensive Assessment  Patient ID: Sandra [name], female   DOB: [birth year], 28 y.o.   MRN: [id number]  Information Source: Information source: Patient  Current Stressors:  Patient states their primary concerns and needs for treatment are:: Anxiety, panic attacks Patient states their goals for this hospitilization and ongoing recovery are:: "Feel better, not get so anxious and overwhelmed" Educational / Learning stressors: None Employment / Job issues: Stable employment Family Relationships: Close relationship with sister Museum/gallery curator / Lack of resources (include bankruptcy): None Housing / Lack of housing: Stable housing, lives with sister Physical health (include injuries & life threatening diseases): None reported Substance abuse: Pt denies Bereavement / Loss: Mother transitioned when pt age 81  Living/Environment/Situation:  Living Arrangements: Other relatives Who else lives in the home?: Sister How long has patient lived in current situation?: 4 months What is atmosphere in current home: Comfortable, Supportive  Family History:  Marital status: Single(Pt states she has been dating boyfriend for 1 yr) Does patient have children?: Yes How many children?: 2 How is patient's relationship with their children?: Pt reports her 27 yr daughter lives with her in the home and 36 yr old son lives with father in [place]  Childhood History:  By whom was/is the patient raised?: Father Description of patient's relationship with caregiver when they were a child: Pt reports her father was opposed to her receiving counseling when she was a child. Pt says she had a hard time managing her emotions Patient's description of current relationship with people who raised him/her: "Pretty good" Does patient have siblings?: Yes Number of Siblings: 2 Description of patient's current relationship with siblings: Pt reports brother lives in [place] and describes her sister as being her "bestfriend" Pt says she has  a good relationship with them Did patient suffer any verbal/emotional/physical/sexual abuse as a child?: No Did patient suffer from severe childhood neglect?: No Has patient ever been sexually abused/assaulted/raped as an adolescent or adult?: No Was the patient ever a victim of a crime or a disaster?: No Witnessed domestic violence?: No Has patient been effected by domestic violence as an adult?: No  Education:  Highest grade of school patient has completed: 9th Currently a student?: No Learning disability?: No  Employment/Work Situation:   Employment situation: Employed Where is patient currently employed?: [employer]ologist[employer] in [place] How long has patient been employed?: 6 mths Patient's job has been impacted by current illness: No What is the longest time patient has a held a job?: 2.5 yrs Where was the patient employed at that time?: [employer] Did You Receive Any Psychiatric Treatment/Services While in the [employer]?: No Are There Guns or Other Weapons in [place]?: No Are These Weapons Safely Secured?: (Pt denies access)  Financial Resources:   Financial resources: Income from employment Does patient have a representative payee or guardian?: No  Alcohol/Substance Abuse:   What has been your use of drugs/alcohol within the last 12 months?: Pt denies If attempted suicide, did drugs/alcohol play a role in this?: No Alcohol/Substance Abuse Treatment Hx: Denies past history Has alcohol/substance abuse ever caused legal problems?: No  Social Support System:   Patient's Community Support System: Good Describe Community Support System: Sister, boyfriend Type of faith/religion: Neurosurgeon Witness  Leisure/Recreation:   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 Pt says she has been with this provider for 1 yr) Patient states concerns and preferences for aftercare planning are: Pt states she would like to resume treatment with current provider Patient states they will know when they are safe and ready for discharge when: "Im not feeling like I did a few days ago" Does patient have access to transportation?: Yes Does patient have financial barriers related to discharge medications?: No Will patient be returning to same living situation after discharge?: Yes  Summary/Recommendations:   Summary and Recommendations (to be completed by the evaluator): Pt is a 28 yr old female who voluntarily came to the ED due to depression, anxiety and suicidal ideation. Pt denies any history of drugs or alcohol. Pt denies any history of trauma or abuse. Pt is being followed by Sandra [name](therapist) at [facility] and would like to resume treatment with this provider. Pt denies any current SI/HI or AH/VH. While here, patient will benefit from crisis stabilization, medication evaluation, group therapy and psychoeducation. In addition, it is recommended that patient remain compliant with the established discharge plan and continue treatment.  Sandra [name]. 08/02/2019

## 2019-08-02 NOTE — Tx Team (Signed)
Initial Treatment Plan 08/02/2019 6:55 AM [name] WER:[id number]    PATIENT STRESSORS: Marital or family conflict Medication change or noncompliance Occupational concerns   PATIENT STRENGTHS: General fund of knowledge Motivation for treatment/growth   PATIENT IDENTIFIED PROBLEMS: Suicidal ideation     Depression                  DISCHARGE CRITERIA:  Improved stabilization in mood, thinking, and/or behavior Reduction of life-threatening or endangering symptoms to within safe limits  PRELIMINARY DISCHARGE PLAN: Outpatient therapy Return to previous living arrangement  PATIENT/FAMILY INVOLVEMENT: This treatment plan has been presented to and reviewed with the patient, Sandra [name], the patient and family have been given the opportunity to ask questions and make suggestions.  Harl Bowie, RN 08/02/2019, 6:55 AM

## 2019-08-02 NOTE — H&P (Signed)
Psychiatric Admission Assessment Adult  Patient Identification: Shalandra Leu MRN:  [id number] Date of Evaluation:  08/02/2019 Chief Complaint:  major depression Principal Diagnosis: <principal problem not specified> Diagnosis:  Active Problems:   Major depression, recurrent ([place])  History of Present Illness: Patient is seen and examined.  Patient is a 28 year old female who presented to the [facility] emergency department on 07/31/2019 with suicidal ideation.  The patient stated that she recently found out that her boyfriend was engaged to another female.  She found this out from the Internet.  She stated that she had gotten acutely depressed and acutely anxious and grabbed a knife but put it down.  She has 2 children and her daughter lives with her, and she did not want to hurt her self.  She did tell her sister that she did not "not want to be around".  Her sister called 26 and she was taken to the hospital for evaluation.  The patient was quite tearful and sad in the emergency room.  She discussed the issues above.  Decision was made to admit her to the hospital.  She was transferred to our facility.  She stated that she felt guilty and anxious about all that it occurred.  She began to doubt herself.  This became overwhelming.  The patient admitted that she had previously been in treatment in therapy for anxiety and some obsessional type thinking.  She stated that that went on for a while, but then she stopped it when she was feeling better.  She denied any previous treatment with medications, she denied any previous suicide attempts, she denied any past family history of treatment or suicide attempts.  She denied current suicidal ideation.  She was admitted to the hospital for evaluation and stabilization.  Associated Signs/Symptoms: Depression Symptoms:  depressed mood, anhedonia, insomnia, psychomotor agitation, fatigue, feelings of worthlessness/guilt, difficulty  concentrating, hopelessness, suicidal thoughts with specific plan, anxiety, loss of energy/fatigue, disturbed sleep, (Hypo) Manic Symptoms:  Impulsivity, Anxiety Symptoms:  Excessive Worry, Psychotic Symptoms:  Denied PTSD Symptoms: Negative Total Time spent with patient: 30 minutes  Past Psychiatric History: Patient has had counseling for several years but never seen by a psychiatrist for medication management, never admitted to the hospital for evaluation or stabilization.  Is the patient at risk to self? Yes.    Has the patient been a risk to self in the past 6 months? No.  Has the patient been a risk to self within the distant past? No.  Is the patient a risk to others? No.  Has the patient been a risk to others in the past 6 months? No.  Has the patient been a risk to others within the distant past? No.   Prior Inpatient Therapy:   Prior Outpatient Therapy:    Alcohol Screening: 1. How often do you have a drink containing alcohol?: Never 2. How many drinks containing alcohol do you have on a typical day when you are drinking?: 1 or 2 3. How often do you have six or more drinks on one occasion?: Never AUDIT-C Score: 0 4. How often during the last year have you found that you were not able to stop drinking once you had started?: Never 5. How often during the last year have you failed to do what was normally expected from you becasue of drinking?: Never 6. How often during the last year have you needed a first drink in the morning to get yourself going after a heavy drinking session?: Never  7. How often during the last year have you had a feeling of guilt of remorse after drinking?: Never 8. How often during the last year have you been unable to remember what happened the night before because you had been drinking?: Never 9. Have you or someone else been injured as a result of your drinking?: No 10. Has a relative or friend or a doctor or another health worker been concerned  about your drinking or suggested you cut down?: No Alcohol Use Disorder Identification Test Final Score (AUDIT): 0 Alcohol Brief Interventions/Follow-up: AUDIT Score <7 follow-up not indicated Substance Abuse History in the last 12 months:  No. Consequences of Substance Abuse: Negative Previous Psychotropic Medications: No  Psychological Evaluations: Yes  Past Medical History:  Past Medical History:  Diagnosis Date  . Anemia   . Diabetes mellitus without complication ([place])   . GERD (gastroesophageal reflux disease)   . Hemorrhage after delivery of fetus    Intraoperative, received blood transfusion  . History of blood transfusion   . Hx of ectopic pregnancy     Past Surgical History:  Procedure Laterality Date  . CESAREAN SECTION    . CESAREAN SECTION  04/23/2012   Procedure: CESAREAN SECTION;  Surgeon: Donnamae Jude, MD;  Location: [facility];  Service: Gynecology;  Laterality: N/A;  . CHOLECYSTECTOMY  10/23/2012   Procedure: LAPAROSCOPIC CHOLECYSTECTOMY WITH INTRAOPERATIVE CHOLANGIOGRAM;  Surgeon: Pedro Earls, MD;  Location: [facility];  Service: General;  Laterality: N/A;  . LAPAROSCOPY FOR ECTOPIC PREGNANCY     Family History:  Family History  Problem Relation Age of Onset  . High blood pressure Mother   . Aneurysm Mother   . Hypertension Mother   . Drug abuse Mother   . Diabetes Mellitus II Father   . Hyperlipidemia Father   . Alcohol abuse Father   . Heart disease Maternal Aunt   . Diabetes Paternal Grandmother   . Diabetes Paternal Grandfather   . Other Neg Hx    Family Psychiatric  History: Noncontributory Tobacco Screening: Have you used any form of tobacco in the last 30 days? (Cigarettes, Smokeless Tobacco, Cigars, and/or Pipes): No Social History:  Social History   Substance and Sexual Activity  Alcohol Use Yes   Comment: occasionally     Social History   Substance and Sexual Activity  Drug Use No    Additional Social History: Marital status: Single(Pt  states she has been dating boyfriend for 1 yr) Does patient have children?: Yes How many children?: 2 How is patient's relationship with their children?: Pt reports her 50 yr daughter lives with her in the home and 44 yr old son lives with father in [place]                         Allergies:   Allergies  Allergen Reactions  . Shellfish Allergy Anaphylaxis   Lab Results:  Results for orders placed or performed during the hospital encounter of 08/02/19 (from the past 48 hour(s))  Glucose, capillary     Status: Abnormal   Collection Time: 08/02/19 12:28 AM  Result Value Ref Range   Glucose-Capillary 120 (H) 70 - 99 mg/dL   Comment 1 Notify RN   Glucose, capillary     Status: Abnormal   Collection Time: 08/02/19  7:20 AM  Result Value Ref Range   Glucose-Capillary 117 (H) 70 - 99 mg/dL   Comment 1 Notify RN   Glucose, capillary  Status: Abnormal   Collection Time: 08/02/19 11:04 AM  Result Value Ref Range   Glucose-Capillary 112 (H) 70 - 99 mg/dL   Comment 1 Notify RN     Blood Alcohol level:  Lab Results  Component Value Date   ETH <10 26/94/8546    Metabolic Disorder Labs:  Lab Results  Component Value Date   HGBA1C 11.1 (A) 04/11/2019   No results found for: PROLACTIN Lab Results  Component Value Date   CHOL 154 04/11/2019   TRIG 114 04/11/2019   HDL 49 04/11/2019   CHOLHDL 3.1 04/11/2019   LDLCALC 82 04/11/2019    Current Medications: Current Facility-Administered Medications  Medication Dose Route Frequency Provider Last Rate Last Dose  . acetaminophen (TYLENOL) tablet 650 mg  650 mg Oral Q6H PRN Sharma Covert, MD      . albuterol (VENTOLIN HFA) 108 (90 Base) MCG/ACT inhaler 1-2 puff  1-2 puff Inhalation Q6H PRN Sharma Covert, MD      . alum & mag hydroxide-simeth (MAALOX/MYLANTA) 200-200-20 MG/5ML suspension 30 mL  30 mL Oral Q4H PRN Sharma Covert, MD      . Derrill Memo ON 08/03/2019] ferrous sulfate tablet 325 mg  325 mg Oral Q breakfast  Sharma Covert, MD      . hydrOXYzine (ATARAX/VISTARIL) tablet 25 mg  25 mg Oral TID PRN Sharma Covert, MD      . magnesium hydroxide (MILK OF MAGNESIA) suspension 30 mL  30 mL Oral Daily PRN Sharma Covert, MD      . metFORMIN (GLUCOPHAGE) tablet 1,000 mg  1,000 mg Oral BID WC Sharma Covert, MD      . pantoprazole (PROTONIX) EC tablet 40 mg  40 mg Oral Daily Sharma Covert, MD   40 mg at 08/02/19 1106  . sertraline (ZOLOFT) tablet 25 mg  25 mg Oral Daily Sharma Covert, MD   25 mg at 08/02/19 1106  . traZODone (DESYREL) tablet 50 mg  50 mg Oral QHS PRN Sharma Covert, MD      . vitamin B-12 (CYANOCOBALAMIN) tablet 500 mcg  500 mcg Oral Daily Sharma Covert, MD   500 mcg at 08/02/19 1105   PTA Medications: Medications Prior to Admission  Medication Sig Dispense Refill Last Dose  . albuterol (VENTOLIN HFA) 108 (90 Base) MCG/ACT inhaler Inhale 1-2 puffs into the lungs every 6 (six) hours as needed for wheezing or shortness of breath. (Patient not taking: Reported on 07/31/2019) 6.7 g 0   . benzonatate (TESSALON) 100 MG capsule Take 1 capsule (100 mg total) by mouth every 8 (eight) hours. (Patient not taking: Reported on 07/31/2019) 21 capsule 0   . Dulaglutide 0.75 MG/0.5ML SOPN Inject 0.75 mg into the skin once a week. 4 pen 3   . ferrous sulfate 325 (65 FE) MG tablet Take 1 tablet (325 mg total) by mouth every other day. (Patient not taking: Reported on 07/31/2019) 90 tablet 3   . fluticasone (FLONASE) 50 MCG/ACT nasal spray Place 1 spray into both nostrils daily. 16 g 2   . glucose blood (ACCU-CHEK GUIDE) test strip Use to check blood sugar once daily or as instructed. 50 each 12   . glucose monitoring kit (FREESTYLE) monitoring kit 1 each by Does not apply route as needed for other. 1 each 0   . guaiFENesin-codeine 100-10 MG/5ML syrup Take 5 mLs by mouth every 6 (six) hours as needed for cough. (Patient not taking: Reported on 07/31/2019) 120  mL 0   . Insulin  Pen Needle 32G X 4 MM MISC 1 Piece by Does not apply route daily. 30 each 3   . Lancets Misc. (ACCU-CHEK FASTCLIX LANCET) KIT 1 Piece by Does not apply route 2 (two) times daily. 1 kit 1   . metFORMIN (GLUCOPHAGE) 1000 MG tablet Take 1 tablet (1,000 mg total) by mouth 2 (two) times daily with a meal. 180 tablet 3   . omeprazole (PRILOSEC) 40 MG capsule TAKE 1 CAPSULE BY MOUTH EVERY DAY (Patient taking differently: Take 40 mg by mouth daily. ) 90 capsule 0   . PAZEO 0.7 % SOLN Place 1 drop into both eyes daily.     . RESTASIS 0.05 % ophthalmic emulsion Place 1 drop into both eyes 2 (two) times daily.       Musculoskeletal: Strength & Muscle Tone: within normal limits Gait & Station: normal Patient leans: N/A  Psychiatric Specialty Exam: Physical Exam  Nursing note and vitals reviewed. Constitutional: She is oriented to person, place, and time. She appears well-developed and well-nourished.  HENT:  Head: Normocephalic.  Respiratory: Effort normal.  Neurological: She is alert and oriented to person, place, and time.    ROS  Blood pressure (!) 130/91, pulse (!) 110, temperature 98.7 F (37.1 C), temperature source Oral, resp. rate 17, height '4\' 11"'  (1.499 m), weight 101.6 kg, last menstrual period 07/28/2019, SpO2 100 %.Body mass index is 45.24 kg/m.  General Appearance: Casual  Eye Contact:  Fair  Speech:  Normal Rate  Volume:  Normal  Mood:  Anxious and Depressed  Affect:  Congruent  Thought Process:  Coherent and Descriptions of Associations: Intact  Orientation:  Full (Time, Place, and Person)  Thought Content:  Logical  Suicidal Thoughts:  No  Homicidal Thoughts:  No  Memory:  Immediate;   Fair Recent;   Fair Remote;   Fair  Judgement:  Intact  Insight:  Fair  Psychomotor Activity:  Normal  Concentration:  Concentration: Fair and Attention Span: Fair  Recall:  [employer] of Knowledge:  Good  Language:  Good  Akathisia:  Negative  Handed:  Right  AIMS (if indicated):      Assets:  Desire for Improvement Housing Resilience  ADL's:  Intact  Cognition:  WNL  Sleep:  Number of Hours: 3.5    Treatment Plan Summary: Daily contact with patient to assess and evaluate symptoms and progress in treatment, Medication management and Plan : Patient is seen and examined.  Patient is a 28 year old female with the above-stated past psychiatric history who was admitted to the hospital secondary to suicidal ideation.  She will be admitted to the hospital.  She will be integrated into the milieu.  She will be encouraged to attend groups and work on her coping skills.  She has not been treated with medications before for anxiety or depression.  She will be started on Zoloft 25 mg p.o. daily, and this will be titrated throughout the course of the hospitalization.  She will also have available hydroxyzine 25 mg p.o. every 6 hours as needed anxiety as well as trazodone 50 mg p.o. nightly as needed insomnia.  She does have a history of reflux disease, and I will place her on Protonix while she is in the hospital.  Review of her laboratories showed a mildly low potassium at 3.4, a mildly low hemoglobin 11.6.  Her MCH and MCHC were slightly low, and her RDW was mildly elevated.  She does have a history  of anemia.  Drug screen was negative, and her beta-hCG was negative as well.  Her blood pressure slightly elevated 130/91 and this will be monitored.  She was mildly tachycardic with a rate of 110 today.  EKG was not obtained, but I will order that we will review that.  Observation Level/Precautions:  15 minute checks  Laboratory:  Chemistry Profile  Psychotherapy:    Medications:    Consultations:    Discharge Concerns:    Estimated LOS:  Other:     Physician Treatment Plan for Primary Diagnosis: <principal problem not specified> Long Term Goal(s): Improvement in symptoms so as ready for discharge  Short Term Goals: Ability to identify changes in lifestyle to reduce recurrence of  condition will improve, Ability to verbalize feelings will improve, Ability to disclose and discuss suicidal ideas, Ability to demonstrate self-control will improve, Ability to identify and develop effective coping behaviors will improve and Ability to maintain clinical measurements within normal limits will improve  Physician Treatment Plan for Secondary Diagnosis: Active Problems:   Major depression, recurrent ([place])  Long Term Goal(s): Improvement in symptoms so as ready for discharge  Short Term Goals: Ability to identify changes in lifestyle to reduce recurrence of condition will improve, Ability to verbalize feelings will improve, Ability to disclose and discuss suicidal ideas, Ability to demonstrate self-control will improve, Ability to identify and develop effective coping behaviors will improve and Ability to maintain clinical measurements within normal limits will improve  I certify that inpatient services furnished can reasonably be expected to improve the patient's condition.    Sharma Covert, MD 11/14/20203:39 PM

## 2019-08-02 NOTE — Progress Notes (Signed)
Admission Note:  28  yr female who presents IVC in no acute distress for the treatment of SI and Depression. Patient' thoughts is organized and coherent, she appears flat, sad and depressed, she was tearful and crying during assessment and she stated " I am scared and afraid" . Pt was calm and cooperative with admission process after some emotional support and encouragement.was offered.  Patient denies SI/HI/AVH,she expressed feeling sad, and suicidal after her ex boyfriend got engaged and secondly she is getting harassed and bullied at work.  Patient has Past medical Hx of  Depression, DM without complication, Morbid obesity, Panic attacks  Anxiety , and GERD. Upon admission her CBG was 120 her skin was assessed and found to be clear of any abnormal marks, she was searched and no contraband found, POC and unit policies explained, understanding verbalized and consents obtained. Food and fluids offered, and both accepted. 15 minutes safety checks maintained will continue to monitor.

## 2019-08-02 NOTE — Plan of Care (Signed)
Pt rates depression and hopelessness 3/10. Pt rates anxiety 4/10. Pt rates denies SI, HI and AVH. Pt was educated on care plan and verbalizes understanding. Collier Bullock RN Problem: Education: Goal: Utilization of techniques to improve thought processes will improve Outcome: Progressing Goal: Knowledge of the prescribed therapeutic regimen will improve Outcome: Progressing   Problem: Activity: Goal: Interest or engagement in leisure activities will improve Outcome: Progressing Goal: Imbalance in normal sleep/wake cycle will improve Outcome: Progressing   Problem: Coping: Goal: Coping ability will improve Outcome: Progressing Goal: Will verbalize feelings Outcome: Progressing   Problem: Health Behavior/Discharge Planning: Goal: Ability to make decisions will improve Outcome: Progressing Goal: Compliance with therapeutic regimen will improve Outcome: Progressing   Problem: Role Relationship: Goal: Will demonstrate positive changes in social behaviors and relationships Outcome: Progressing   Problem: Safety: Goal: Ability to disclose and discuss suicidal ideas will improve Outcome: Progressing Goal: Ability to identify and utilize support systems that promote safety will improve Outcome: Progressing   Problem: Self-Concept: Goal: Will verbalize positive feelings about self Outcome: Progressing Goal: Level of anxiety will decrease Outcome: Progressing   Problem: Education: Goal: Utilization of techniques to improve thought processes will improve Outcome: Progressing Goal: Knowledge of the prescribed therapeutic regimen will improve Outcome: Progressing   Problem: Activity: Goal: Interest or engagement in leisure activities will improve Outcome: Progressing Goal: Imbalance in normal sleep/wake cycle will improve Outcome: Progressing   Problem: Coping: Goal: Coping ability will improve Outcome: Progressing Goal: Will verbalize feelings Outcome: Progressing    Problem: Health Behavior/Discharge Planning: Goal: Ability to make decisions will improve Outcome: Progressing Goal: Compliance with therapeutic regimen will improve Outcome: Progressing   Problem: Role Relationship: Goal: Will demonstrate positive changes in social behaviors and relationships Outcome: Progressing   Problem: Safety: Goal: Ability to disclose and discuss suicidal ideas will improve Outcome: Progressing Goal: Ability to identify and utilize support systems that promote safety will improve Outcome: Progressing   Problem: Self-Concept: Goal: Will verbalize positive feelings about self Outcome: Progressing Goal: Level of anxiety will decrease Outcome: Progressing   Problem: Education: Goal: Ability to make informed decisions regarding treatment will improve Outcome: Progressing   Problem: Coping: Goal: Coping ability will improve Outcome: Progressing   Problem: Medication: Goal: Compliance with prescribed medication regimen will improve Outcome: Progressing   Problem: Self-Concept: Goal: Ability to disclose and discuss suicidal ideas will improve Outcome: Progressing Goal: Will verbalize positive feelings about self Outcome: Progressing   Problem: Education: Goal: Ability to make informed decisions regarding treatment will improve Outcome: Progressing   Problem: Health Behavior/Discharge Planning: Goal: Identification of resources available to assist in meeting health care needs will improve Outcome: Progressing   Problem: Medication: Goal: Compliance with prescribed medication regimen will improve Outcome: Progressing   Problem: Self-Concept: Goal: Ability to disclose and discuss suicidal ideas will improve Outcome: Progressing Goal: Will verbalize positive feelings about self Outcome: Progressing

## 2019-08-02 NOTE — Tx Team (Signed)
Interdisciplinary Treatment and Diagnostic Plan Update  08/02/2019 Time of Session: 9am Sandra [name] MRN: [id number]  Principal Diagnosis: <principal problem not specified>  Secondary Diagnoses: Active Problems:   Major depression, recurrent (HCC)   Current Medications:  Current Facility-Administered Medications  Medication Dose Route Frequency Provider Last Rate Last Dose  . acetaminophen (TYLENOL) tablet 650 mg  650 mg Oral Q6H PRN Sharma Covert, MD      . albuterol (VENTOLIN HFA) 108 (90 Base) MCG/ACT inhaler 1-2 puff  1-2 puff Inhalation Q6H PRN Sharma Covert, MD      . alum & mag hydroxide-simeth (MAALOX/MYLANTA) 200-200-20 MG/5ML suspension 30 mL  30 mL Oral Q4H PRN Sharma Covert, MD      . Derrill Memo ON 08/03/2019] ferrous sulfate tablet 325 mg  325 mg Oral Q breakfast Sharma Covert, MD      . hydrOXYzine (ATARAX/VISTARIL) tablet 25 mg  25 mg Oral TID PRN Sharma Covert, MD      . magnesium hydroxide (MILK OF MAGNESIA) suspension 30 mL  30 mL Oral Daily PRN Sharma Covert, MD      . metFORMIN (GLUCOPHAGE) tablet 1,000 mg  1,000 mg Oral BID WC Sharma Covert, MD      . pantoprazole (PROTONIX) EC tablet 40 mg  40 mg Oral Daily Sharma Covert, MD      . sertraline (ZOLOFT) tablet 25 mg  25 mg Oral Daily Sharma Covert, MD      . traZODone (DESYREL) tablet 50 mg  50 mg Oral QHS PRN Sharma Covert, MD      . vitamin B-12 (CYANOCOBALAMIN) tablet 500 mcg  500 mcg Oral Daily Sharma Covert, MD       PTA Medications: Medications Prior to Admission  Medication Sig Dispense Refill Last Dose  . albuterol (VENTOLIN HFA) 108 (90 Base) MCG/ACT inhaler Inhale 1-2 puffs into the lungs every 6 (six) hours as needed for wheezing or shortness of breath. (Patient not taking: Reported on 07/31/2019) 6.7 g 0   . benzonatate (TESSALON) 100 MG capsule Take 1 capsule (100 mg total) by mouth every 8 (eight) hours. (Patient not taking: Reported on 07/31/2019)  21 capsule 0   . Dulaglutide 0.75 MG/0.5ML SOPN Inject 0.75 mg into the skin once a week. 4 pen 3   . ferrous sulfate 325 (65 FE) MG tablet Take 1 tablet (325 mg total) by mouth every other day. (Patient not taking: Reported on 07/31/2019) 90 tablet 3   . fluticasone (FLONASE) 50 MCG/ACT nasal spray Place 1 spray into both nostrils daily. 16 g 2   . glucose blood (ACCU-CHEK GUIDE) test strip Use to check blood sugar once daily or as instructed. 50 each 12   . glucose monitoring kit (FREESTYLE) monitoring kit 1 each by Does not apply route as needed for other. 1 each 0   . guaiFENesin-codeine 100-10 MG/5ML syrup Take 5 mLs by mouth every 6 (six) hours as needed for cough. (Patient not taking: Reported on 07/31/2019) 120 mL 0   . Insulin Pen Needle 32G X 4 MM MISC 1 Piece by Does not apply route daily. 30 each 3   . Lancets Misc. (ACCU-CHEK FASTCLIX LANCET) KIT 1 Piece by Does not apply route 2 (two) times daily. 1 kit 1   . metFORMIN (GLUCOPHAGE) 1000 MG tablet Take 1 tablet (1,000 mg total) by mouth 2 (two) times daily with a meal. 180 tablet 3   . omeprazole (PRILOSEC) 40 MG  capsule TAKE 1 CAPSULE BY MOUTH EVERY DAY (Patient taking differently: Take 40 mg by mouth daily. ) 90 capsule 0   . PAZEO 0.7 % SOLN Place 1 drop into both eyes daily.     . RESTASIS 0.05 % ophthalmic emulsion Place 1 drop into both eyes 2 (two) times daily.       Patient Stressors: Marital or family conflict Medication change or noncompliance Occupational concerns  Patient Strengths: Technical sales engineer for treatment/growth  Treatment Modalities: Medication Management, Group therapy, Case management,  1 to 1 session with clinician, Psychoeducation, Recreational therapy.   Physician Treatment Plan for Primary Diagnosis: <principal problem not specified> Long Term Goal(s):     Short Term Goals:    Medication Management: Evaluate patient's response, side effects, and tolerance of medication  regimen.  Therapeutic Interventions: 1 to 1 sessions, Unit Group sessions and Medication administration.  Evaluation of Outcomes: Not Met  Physician Treatment Plan for Secondary Diagnosis: Active Problems:   Major depression, recurrent ([place])  Long Term Goal(s):     Short Term Goals:       Medication Management: Evaluate patient's response, side effects, and tolerance of medication regimen.  Therapeutic Interventions: 1 to 1 sessions, Unit Group sessions and Medication administration.  Evaluation of Outcomes: Not Met   RN Treatment Plan for Primary Diagnosis: <principal problem not specified> Long Term Goal(s): Knowledge of disease and therapeutic regimen to maintain health will improve  Short Term Goals: Ability to verbalize feelings will improve, Ability to disclose and discuss suicidal ideas, Ability to identify and develop effective coping behaviors will improve and Compliance with prescribed medications will improve  Medication Management: RN will administer medications as ordered by provider, will assess and evaluate patient's response and provide education to patient for prescribed medication. RN will report any adverse and/or side effects to prescribing provider.  Therapeutic Interventions: 1 on 1 counseling sessions, Psychoeducation, Medication administration, Evaluate responses to treatment, Monitor vital signs and CBGs as ordered, Perform/monitor CIWA, COWS, AIMS and Fall Risk screenings as ordered, Perform wound care treatments as ordered.  Evaluation of Outcomes: Not Met   LCSW Treatment Plan for Primary Diagnosis: <principal problem not specified> Long Term Goal(s): Safe transition to appropriate next level of care at discharge, Engage patient in therapeutic group addressing interpersonal concerns.  Short Term Goals: Engage patient in aftercare planning with referrals and resources  Therapeutic Interventions: Assess for all discharge needs, 1 to 1 time with Social  worker, Explore available resources and support systems, Assess for adequacy in community support network, Educate family and significant other(s) on suicide prevention, Complete Psychosocial Assessment, Interpersonal group therapy.  Evaluation of Outcomes: Not Met   Progress in Treatment: Attending groups: No. Participating in groups: No. Taking medication as prescribed: Yes. Toleration medication: Yes. Family/Significant other contact made: No, will contact:  when pt gives consent Patient understands diagnosis: Yes. Discussing patient identified problems/goals with staff: Yes. Medical problems stabilized or resolved: No. Denies suicidal/homicidal ideation: Yes. Issues/concerns per patient self-inventory: No. Other: NA  New problem(s) identified: No, Describe:  none reported  New Short Term/Long Term Goal(s):Attend outpatient treatment, take medication as prescribed, develop and implement healthy coping methods  Patient Goals:  "Feel better, not get so anxious and overwhelmed"  Discharge Plan or Barriers: Pt will return home and follow up with outpatient treatment  Reason for Continuation of Hospitalization: Anxiety Depression Medication stabilization  Estimated Length of Stay:1-7 days  Attendees: Patient:Sandra [name] 08/02/2019 11:02 AM  Physician: Myles Lipps 08/02/2019 11:02  AM  Nursing:  08/02/2019 11:02 AM  RN Care Manager: 08/02/2019 11:02 AM  Social Worker: Sanjuana Kava 08/02/2019 11:02 AM  Recreational Therapist:  08/02/2019 11:02 AM  Other:  08/02/2019 11:02 AM  Other:  08/02/2019 11:02 AM  Other: 08/02/2019 11:02 AM    Scribe for Treatment Team: Yvette Rack, LCSW 08/02/2019 11:02 AM

## 2019-08-03 LAB — TSH: TSH: 1.277 u[IU]/mL (ref 0.350–4.500)

## 2019-08-03 LAB — GLUCOSE, CAPILLARY: Glucose-Capillary: 129 mg/dL — ABNORMAL HIGH (ref 70–99)

## 2019-08-03 MED ORDER — LISINOPRIL 2.5 MG PO TABS
2.5000 mg | ORAL_TABLET | Freq: Every day | ORAL | Status: DC
  Administered 2019-08-03 – 2019-08-05 (×3): 2.5 mg via ORAL
  Filled 2019-08-03 (×3): qty 1

## 2019-08-03 NOTE — Plan of Care (Signed)
Pt rates depression and anxiety 3/10. Pt denies SI, HI and AVH. Pt was educated on care plan and verbalizes understanding. Collier Bullock RN Problem: Education: Goal: Utilization of techniques to improve thought processes will improve Outcome: Progressing Goal: Knowledge of the prescribed therapeutic regimen will improve Outcome: Progressing   Problem: Activity: Goal: Interest or engagement in leisure activities will improve Outcome: Progressing Goal: Imbalance in normal sleep/wake cycle will improve Outcome: Progressing   Problem: Coping: Goal: Coping ability will improve Outcome: Progressing Goal: Will verbalize feelings Outcome: Progressing   Problem: Health Behavior/Discharge Planning: Goal: Ability to make decisions will improve Outcome: Progressing Goal: Compliance with therapeutic regimen will improve Outcome: Progressing   Problem: Role Relationship: Goal: Will demonstrate positive changes in social behaviors and relationships Outcome: Progressing   Problem: Safety: Goal: Ability to disclose and discuss suicidal ideas will improve Outcome: Progressing Goal: Ability to identify and utilize support systems that promote safety will improve Outcome: Progressing   Problem: Self-Concept: Goal: Will verbalize positive feelings about self Outcome: Progressing Goal: Level of anxiety will decrease Outcome: Progressing   Problem: Education: Goal: Utilization of techniques to improve thought processes will improve Outcome: Progressing Goal: Knowledge of the prescribed therapeutic regimen will improve Outcome: Progressing   Problem: Activity: Goal: Interest or engagement in leisure activities will improve Outcome: Progressing Goal: Imbalance in normal sleep/wake cycle will improve Outcome: Progressing   Problem: Coping: Goal: Coping ability will improve Outcome: Progressing Goal: Will verbalize feelings Outcome: Progressing   Problem: Health Behavior/Discharge  Planning: Goal: Ability to make decisions will improve Outcome: Progressing Goal: Compliance with therapeutic regimen will improve Outcome: Progressing   Problem: Role Relationship: Goal: Will demonstrate positive changes in social behaviors and relationships Outcome: Progressing   Problem: Safety: Goal: Ability to disclose and discuss suicidal ideas will improve Outcome: Progressing Goal: Ability to identify and utilize support systems that promote safety will improve Outcome: Progressing   Problem: Self-Concept: Goal: Will verbalize positive feelings about self Outcome: Progressing Goal: Level of anxiety will decrease Outcome: Progressing   Problem: Education: Goal: Ability to make informed decisions regarding treatment will improve Outcome: Progressing   Problem: Coping: Goal: Coping ability will improve Outcome: Progressing   Problem: Health Behavior/Discharge Planning: Goal: Identification of resources available to assist in meeting health care needs will improve Outcome: Progressing   Problem: Medication: Goal: Compliance with prescribed medication regimen will improve Outcome: Progressing   Problem: Self-Concept: Goal: Ability to disclose and discuss suicidal ideas will improve Outcome: Progressing Goal: Will verbalize positive feelings about self Outcome: Progressing   Problem: Education: Goal: Ability to make informed decisions regarding treatment will improve Outcome: Progressing   Problem: Coping: Goal: Coping ability will improve Outcome: Progressing   Problem: Health Behavior/Discharge Planning: Goal: Identification of resources available to assist in meeting health care needs will improve Outcome: Progressing   Problem: Medication: Goal: Compliance with prescribed medication regimen will improve Outcome: Progressing   Problem: Self-Concept: Goal: Ability to disclose and discuss suicidal ideas will improve Outcome: Progressing Goal: Will  verbalize positive feelings about self Outcome: Progressing

## 2019-08-03 NOTE — Progress Notes (Signed)
Pt has been calm, coopertaive, pleasant and social today. Collier Bullock Rn

## 2019-08-03 NOTE — Progress Notes (Signed)
[facility] MD Progress Note  08/03/2019 11:36 AM Sandra [name]  MRN:  [id number] Subjective: Patient is a 28 year old female who presented originally to the [facility] emergency department on 07/31/2019 with suicidal ideation.  She was transferred to our facility for continued care.  Objective: Patient is seen and examined.  Patient is a 28 year old female with the above-stated past psychiatric history who is seen in follow-up.  She is doing well.  She denied any suicidal ideation.  She stated she has not had any side effects to the Zoloft.  She stated her mood was much better than when she was admitted here.  She did admit that she had been treated with Metformin at 1000 mg for her diabetes mellitus, but had not been on blood pressure medications previously.  She denied any side effects to her current medications.  Her blood sugar this morning is improved at 129.  Her blood pressure is elevated at 157/92.  She slept 7.5 hours last night.  No new laboratories.  Principal Problem: <principal problem not specified> Diagnosis: Active Problems:   Major depression, recurrent ([place])  Total Time spent with patient: 20 minutes  Past Psychiatric History: See admission H&P  Past Medical History:  Past Medical History:  Diagnosis Date  . Anemia   . Diabetes mellitus without complication ([place])   . GERD (gastroesophageal reflux disease)   . Hemorrhage after delivery of fetus    Intraoperative, received blood transfusion  . History of blood transfusion   . Hx of ectopic pregnancy     Past Surgical History:  Procedure Laterality Date  . CESAREAN SECTION    . CESAREAN SECTION  04/23/2012   Procedure: CESAREAN SECTION;  Surgeon: Donnamae Jude, MD;  Location: [facility];  Service: Gynecology;  Laterality: N/A;  . CHOLECYSTECTOMY  10/23/2012   Procedure: LAPAROSCOPIC CHOLECYSTECTOMY WITH INTRAOPERATIVE CHOLANGIOGRAM;  Surgeon: Pedro Earls, MD;  Location: [facility];  Service: General;   Laterality: N/A;  . LAPAROSCOPY FOR ECTOPIC PREGNANCY     Family History:  Family History  Problem Relation Age of Onset  . High blood pressure Mother   . Aneurysm Mother   . Hypertension Mother   . Drug abuse Mother   . Diabetes Mellitus II Father   . Hyperlipidemia Father   . Alcohol abuse Father   . Heart disease Maternal Aunt   . Diabetes Paternal Grandmother   . Diabetes Paternal Grandfather   . Other Neg Hx    Family Psychiatric  History: See admission H&P Social History:  Social History   Substance and Sexual Activity  Alcohol Use Yes   Comment: occasionally     Social History   Substance and Sexual Activity  Drug Use No    Social History   Socioeconomic History  . Marital status: Single    Spouse name: Not on file  . Number of children: Not on file  . Years of education: Not on file  . Highest education level: Not on file  Occupational History  . Not on file  Social Needs  . Financial resource strain: Not on file  . Food insecurity    Worry: Not on file    Inability: Not on file  . Transportation needs    Medical: Not on file    Non-medical: Not on file  Tobacco Use  . Smoking status: Former Research scientist (life sciences)  . Smokeless tobacco: Never Used  Substance and Sexual Activity  . Alcohol use: Yes    Comment: occasionally  .  Drug use: No  . Sexual activity: Not Currently    Birth control/protection: Implant  Lifestyle  . Physical activity    Days per week: Not on file    Minutes per session: Not on file  . Stress: Not on file  Relationships  . Social Musician on phone: Not on file    Gets together: Not on file    Attends religious service: Not on file    Active member of club or organization: Not on file    Attends meetings of clubs or organizations: Not on file    Relationship status: Not on file  Other Topics Concern  . Not on file  Social History Narrative  . Not on file   Additional Social History:                          Sleep: Good  Appetite:  Good  Current Medications: Current Facility-Administered Medications  Medication Dose Route Frequency Provider Last Rate Last Dose  . acetaminophen (TYLENOL) tablet 650 mg  650 mg Oral Q6H PRN Antonieta Pert, MD      . albuterol (VENTOLIN HFA) 108 (90 Base) MCG/ACT inhaler 1-2 puff  1-2 puff Inhalation Q6H PRN Antonieta Pert, MD      . alum & mag hydroxide-simeth (MAALOX/MYLANTA) 200-200-20 MG/5ML suspension 30 mL  30 mL Oral Q4H PRN Antonieta Pert, MD      . ferrous sulfate tablet 325 mg  325 mg Oral Q breakfast Antonieta Pert, MD   325 mg at 08/03/19 8502  . hydrOXYzine (ATARAX/VISTARIL) tablet 25 mg  25 mg Oral TID PRN Antonieta Pert, MD      . magnesium hydroxide (MILK OF MAGNESIA) suspension 30 mL  30 mL Oral Daily PRN Antonieta Pert, MD      . metFORMIN (GLUCOPHAGE) tablet 1,000 mg  1,000 mg Oral BID WC Antonieta Pert, MD   1,000 mg at 08/03/19 0806  . pantoprazole (PROTONIX) EC tablet 40 mg  40 mg Oral Daily Antonieta Pert, MD   40 mg at 08/03/19 7741  . sertraline (ZOLOFT) tablet 25 mg  25 mg Oral Daily Antonieta Pert, MD   25 mg at 08/03/19 2878  . traZODone (DESYREL) tablet 50 mg  50 mg Oral QHS PRN Antonieta Pert, MD      . vitamin B-12 (CYANOCOBALAMIN) tablet 500 mcg  500 mcg Oral Daily Antonieta Pert, MD   500 mcg at 08/03/19 0807    Lab Results:  Results for orders placed or performed during the hospital encounter of 08/02/19 (from the past 48 hour(s))  Glucose, capillary     Status: Abnormal   Collection Time: 08/02/19 12:28 AM  Result Value Ref Range   Glucose-Capillary 120 (H) 70 - 99 mg/dL   Comment 1 Notify RN   Glucose, capillary     Status: Abnormal   Collection Time: 08/02/19  7:20 AM  Result Value Ref Range   Glucose-Capillary 117 (H) 70 - 99 mg/dL   Comment 1 Notify RN   Glucose, capillary     Status: Abnormal   Collection Time: 08/02/19 11:04 AM  Result Value Ref Range    Glucose-Capillary 112 (H) 70 - 99 mg/dL   Comment 1 Notify RN   TSH     Status: None   Collection Time: 08/03/19  6:58 AM  Result Value Ref Range   TSH 1.277 0.350 -  4.500 uIU/mL    Comment: Performed by a 3rd Generation assay with a functional sensitivity of <=0.01 uIU/mL. Performed at [facility], [address] [place], [place] [address]   Glucose, capillary     Status: Abnormal   Collection Time: 08/03/19  7:08 AM  Result Value Ref Range   Glucose-Capillary 129 (H) 70 - 99 mg/dL   Comment 1 Notify RN     Blood Alcohol level:  Lab Results  Component Value Date   ETH <10 07/31/2019    Metabolic Disorder Labs: Lab Results  Component Value Date   HGBA1C 11.1 (A) 04/11/2019   No results found for: PROLACTIN Lab Results  Component Value Date   CHOL 154 04/11/2019   TRIG 114 04/11/2019   HDL 49 04/11/2019   CHOLHDL 3.1 04/11/2019   LDLCALC 82 04/11/2019    Physical Findings: AIMS: Facial and Oral Movements Muscles of Facial Expression: None, normal Lips and Perioral Area: None, normal Jaw: None, normal Tongue: None, normal,Extremity Movements Upper (arms, wrists, hands, fingers): None, normal Lower (legs, knees, ankles, toes): None, normal,  , Overall Severity Severity of abnormal movements (highest score from questions above): None, normal Incapacitation due to abnormal movements: None, normal Patient's awareness of abnormal movements (rate only patient's report): No Awareness, Dental Status Current problems with teeth and/or dentures?: No Does patient usually wear dentures?: No  CIWA:  CIWA-Ar Total: 3 COWS:  COWS Total Score: 1  Musculoskeletal: Strength & Muscle Tone: within normal limits Gait & Station: normal Patient leans: N/A  Psychiatric Specialty Exam: Physical Exam  Nursing note and vitals reviewed. Constitutional: She is oriented to person, place, and time. She appears well-developed and well-nourished.  HENT:  Head: Normocephalic  and atraumatic.  Respiratory: Effort normal.  Neurological: She is alert and oriented to person, place, and time.    ROS  Blood pressure (!) 157/92, pulse (!) 104, temperature 98.9 F (37.2 C), temperature source Oral, resp. rate 18, height 4\' 11"  (1.499 m), weight 101.6 kg, last menstrual period 07/28/2019, SpO2 98 %.Body mass index is 45.24 kg/m.  General Appearance: Casual  Eye Contact:  Good  Speech:  Normal Rate  Volume:  Normal  Mood:  Euthymic  Affect:  Congruent  Thought Process:  Coherent and Descriptions of Associations: Intact  Orientation:  Full (Time, Place, and Person)  Thought Content:  Logical  Suicidal Thoughts:  No  Homicidal Thoughts:  No  Memory:  Immediate;   Good Recent;   Good Remote;   Good  Judgement:  Intact  Insight:  Fair  Psychomotor Activity:  Normal  Concentration:  Concentration: Good and Attention Span: Good  Recall:  Good  Fund of Knowledge:  Good  Language:  Good  Akathisia:  Negative  Handed:  Right  AIMS (if indicated):     Assets:  Desire for Improvement Resilience  ADL's:  Intact  Cognition:  WNL  Sleep:  Number of Hours: 7.5     Treatment Plan Summary: Daily contact with patient to assess and evaluate symptoms and progress in treatment, Medication management and Plan : Patient is seen and examined.  Patient is a 28 year old female with the above-stated past psychiatric history who is seen in follow-up.   Diagnosis: #1 major depression, recurrent, severe without psychotic features, #2 generalized anxiety disorder, #3 type 2 diabetes mellitus, #4 essential hypertension, #5 anemia  Patient is seen in follow-up.  She is doing better.  I am going to continue her Metformin 1000 mg p.o. twice daily.  I am going to add lisinopril 2.5 mg p.o. daily for hypertension.  I am going to continue her Zoloft at 25 mg p.o. daily at its current dosage.  No other changes in her medications at this time.  I would anticipate discharge in 1 to 2 days. 1.   Continue albuterol HFA 1 to 2 puffs every 6 hours as needed wheezing. 2.  Continue ferrous sulfate 325 mg p.o. daily with breakfast for anemia. 3.  Continue hydroxyzine 25 mg p.o. 3 times daily as needed anxiety. 4.  Continue Metformin at 1000 mg p.o. twice daily with food for diabetes mellitus type 2. 5.  Continue Protonix 40 mg p.o. daily for gastric protection. 6.  Continue Zoloft 25 mg p.o. daily for depression and anxiety. 7.  Continue trazodone 50 mg p.o. nightly as needed insomnia. 8.  Continue B12 500 mg p.o. daily for nutritional supplementation. 9.  Start lisinopril 2.5 mg p.o. nightly for hypertension and renal protection. 10.  Disposition planning-in progress.  Antonieta Pert, MD 08/03/2019, 11:36 AM

## 2019-08-03 NOTE — Plan of Care (Signed)
  Problem: Education: Goal: Utilization of techniques to improve thought processes will improve Outcome: Progressing Goal: Knowledge of the prescribed therapeutic regimen will improve Outcome: Progressing  D: Patient has been isolative to room and to self. Denies SI. Rates depression 4/10, but has remained in bed with covers over her head. Affect is sad A: Continue to monitor for safety R: Safety maintained.

## 2019-08-03 NOTE — [facility] Group Notes (Signed)
LCSW Group Therapy Note 08/03/2019 1:15pm  Type of Therapy and Topic: Group Therapy: Feelings Around Returning Home & Establishing a Supportive Framework and Supporting Oneself When Supports Not Available  Participation Level: Active  Description of Group:  Patients first processed thoughts and feelings about upcoming discharge. These included fears of upcoming changes, lack of change, new living environments, judgements and expectations from others and overall stigma of mental health issues. The group then discussed the definition of a supportive framework, what that looks and feels like, and how do to discern it from an unhealthy non-supportive network. The group identified different types of supports as well as what to do when your family/friends are less than helpful or unavailable  Therapeutic Goals  1. Patient will identify one healthy supportive network that they can use at discharge. 2. Patient will identify one factor of a supportive framework and how to tell it from an unhealthy network. 3. Patient able to identify one coping skill to use when they do not have positive supports from others. 4. Patient will demonstrate ability to communicate their needs through discussion and/or role plays.  Summary of Patient Progress:  The patient reported she feels "energetic." Pt engaged during group session. As patients processed their anxiety about discharge and described healthy supports patient shared she is ready to be discharge because she knows the things she should not be doing.  Patients identified at least one self-care tool they were willing to use after discharge.   Therapeutic Modalities Cognitive Behavioral Therapy Motivational Interviewing   Cheree Ditto, LCSW 08/03/2019 12:23 PM

## 2019-08-03 NOTE — Progress Notes (Signed)
D: Patient has been isolative to room and to self. Denies SI. Rates depression 4/10, but has remained in bed with covers over her head. Affect is sad A: Continue to monitor for safety R: Safety maintained.

## 2019-08-04 DIAGNOSIS — F4325 Adjustment disorder with mixed disturbance of emotions and conduct: Secondary | ICD-10-CM

## 2019-08-04 LAB — GLUCOSE, CAPILLARY: Glucose-Capillary: 117 mg/dL — ABNORMAL HIGH (ref 70–99)

## 2019-08-04 NOTE — Plan of Care (Signed)
  Problem: Education: Goal: Utilization of techniques to improve thought processes will improve Outcome: Progressing Goal: Knowledge of the prescribed therapeutic regimen will improve Outcome: Progressing  D: Patient has been calm and cooperative. Mood is pleasant. Affect is appropriate to circumstance. Denies SI, HI and AVH. Contracting for safety A: Continue to monitor for safety R: Safety maintained.

## 2019-08-04 NOTE — Progress Notes (Signed)
[facility] MD Progress Note  08/04/2019 5:36 PM Sandra [name]  MRN:  [id number] Subjective: Patient seen chart reviewed.  This is a 28 year old woman who came into the hospital saying she was having an anxiety attack.  In fact it sounds like she had had a very stressful day found out that her boyfriend was engaged to another woman.  Started getting nervous shaky tearful.  Picked up a knife but did not cut herself.  Patient says she feels nervous and anxious much of the time.  Occasionally has trouble sleeping.  Feels that she has a lot of stress in her life.  Denies now any suicidal or homicidal thought at all.  Denies any psychotic symptoms.  Has been cooperative with treatment since coming to the hospital.  Not abusing substances. Principal Problem: Adjustment disorder diagnosis: Active Problems:   Major depression, recurrent ([facility])  Total Time spent with patient: 1 hour  Past Psychiatric History: No prior medications or treatment.  Sounds like she has been seeing a therapist at times  Past Medical History:  Past Medical History:  Diagnosis Date  . Anemia   . Diabetes mellitus without complication ([facility])   . GERD (gastroesophageal reflux disease)   . Hemorrhage after delivery of fetus    Intraoperative, received blood transfusion  . History of blood transfusion   . Hx of ectopic pregnancy     Past Surgical History:  Procedure Laterality Date  . CESAREAN SECTION    . CESAREAN SECTION  04/23/2012   Procedure: CESAREAN SECTION;  Surgeon: Reva Bores, MD;  Location: [facility];  Service: Gynecology;  Laterality: N/A;  . CHOLECYSTECTOMY  10/23/2012   Procedure: LAPAROSCOPIC CHOLECYSTECTOMY WITH INTRAOPERATIVE CHOLANGIOGRAM;  Surgeon: Valarie Merino, MD;  Location: [facility];  Service: General;  Laterality: N/A;  . LAPAROSCOPY FOR ECTOPIC PREGNANCY     Family History:  Family History  Problem Relation Age of Onset  . High blood pressure Mother   . Aneurysm Mother   . Hypertension Mother   . Drug  abuse Mother   . Diabetes Mellitus II Father   . Hyperlipidemia Father   . Alcohol abuse Father   . Heart disease Maternal Aunt   . Diabetes Paternal Grandmother   . Diabetes Paternal Grandfather   . Other Neg Hx    Family Psychiatric  History: None reported Social History:  Social History   Substance and Sexual Activity  Alcohol Use Yes   Comment: occasionally     Social History   Substance and Sexual Activity  Drug Use No    Social History   Socioeconomic History  . Marital status: Single    Spouse name: Not on file  . Number of children: Not on file  . Years of education: Not on file  . Highest education level: Not on file  Occupational History  . Not on file  Social Needs  . Financial resource strain: Not on file  . Food insecurity    Worry: Not on file    Inability: Not on file  . Transportation needs    Medical: Not on file    Non-medical: Not on file  Tobacco Use  . Smoking status: Former Games developer  . Smokeless tobacco: Never Used  Substance and Sexual Activity  . Alcohol use: Yes    Comment: occasionally  . Drug use: No  . Sexual activity: Not Currently    Birth control/protection: Implant  Lifestyle  . Physical activity    Days per week: Not on file  Minutes per session: Not on file  . Stress: Not on file  Relationships  . Social Herbalist on phone: Not on file    Gets together: Not on file    Attends religious service: Not on file    Active member of club or organization: Not on file    Attends meetings of clubs or organizations: Not on file    Relationship status: Not on file  Other Topics Concern  . Not on file  Social History Narrative  . Not on file   Additional Social History:                         Sleep: Fair  Appetite:  Fair  Current Medications: Current Facility-Administered Medications  Medication Dose Route Frequency Provider Last Rate Last Dose  . acetaminophen (TYLENOL) tablet 650 mg  650 mg Oral  Q6H PRN Sharma Covert, MD      . albuterol (VENTOLIN HFA) 108 (90 Base) MCG/ACT inhaler 1-2 puff  1-2 puff Inhalation Q6H PRN Sharma Covert, MD      . alum & mag hydroxide-simeth (MAALOX/MYLANTA) 200-200-20 MG/5ML suspension 30 mL  30 mL Oral Q4H PRN Sharma Covert, MD      . ferrous sulfate tablet 325 mg  325 mg Oral Q breakfast Sharma Covert, MD   325 mg at 08/04/19 3419  . hydrOXYzine (ATARAX/VISTARIL) tablet 25 mg  25 mg Oral TID PRN Sharma Covert, MD      . lisinopril (ZESTRIL) tablet 2.5 mg  2.5 mg Oral Daily Sharma Covert, MD   2.5 mg at 08/04/19 6222  . magnesium hydroxide (MILK OF MAGNESIA) suspension 30 mL  30 mL Oral Daily PRN Sharma Covert, MD      . metFORMIN (GLUCOPHAGE) tablet 1,000 mg  1,000 mg Oral BID WC Sharma Covert, MD   1,000 mg at 08/04/19 1710  . pantoprazole (PROTONIX) EC tablet 40 mg  40 mg Oral Daily Sharma Covert, MD   40 mg at 08/04/19 0831  . sertraline (ZOLOFT) tablet 25 mg  25 mg Oral Daily Sharma Covert, MD   25 mg at 08/04/19 9798  . traZODone (DESYREL) tablet 50 mg  50 mg Oral QHS PRN Sharma Covert, MD      . vitamin B-12 (CYANOCOBALAMIN) tablet 500 mcg  500 mcg Oral Daily Sharma Covert, MD   500 mcg at 08/04/19 9211    Lab Results:  Results for orders placed or performed during the hospital encounter of 08/02/19 (from the past 48 hour(s))  TSH     Status: None   Collection Time: 08/03/19  6:58 AM  Result Value Ref Range   TSH 1.277 0.350 - 4.500 uIU/mL    Comment: Performed by a 3rd Generation assay with a functional sensitivity of <=0.01 uIU/mL. Performed at [facility], [address] [place], [place] [address]   Glucose, capillary     Status: Abnormal   Collection Time: 08/03/19  7:08 AM  Result Value Ref Range   Glucose-Capillary 129 (H) 70 - 99 mg/dL   Comment 1 Notify RN   Glucose, capillary     Status: Abnormal   Collection Time: 08/04/19  7:04 AM  Result Value Ref Range    Glucose-Capillary 117 (H) 70 - 99 mg/dL   Comment 1 Notify RN     Blood Alcohol level:  Lab Results  Component Value Date  ETH <10 07/31/2019    Metabolic Disorder Labs: Lab Results  Component Value Date   HGBA1C 11.1 (A) 04/11/2019   No results found for: PROLACTIN Lab Results  Component Value Date   CHOL 154 04/11/2019   TRIG 114 04/11/2019   HDL 49 04/11/2019   CHOLHDL 3.1 04/11/2019   LDLCALC 82 04/11/2019    Physical Findings: AIMS: Facial and Oral Movements Muscles of Facial Expression: None, normal Lips and Perioral Area: None, normal Jaw: None, normal Tongue: None, normal,Extremity Movements Upper (arms, wrists, hands, fingers): None, normal Lower (legs, knees, ankles, toes): None, normal,  , Overall Severity Severity of abnormal movements (highest score from questions above): None, normal Incapacitation due to abnormal movements: None, normal Patient's awareness of abnormal movements (rate only patient's report): No Awareness, Dental Status Current problems with teeth and/or dentures?: No Does patient usually wear dentures?: No  CIWA:  CIWA-Ar Total: 3 COWS:  COWS Total Score: 1  Musculoskeletal: Strength & Muscle Tone: within normal limits Gait & Station: normal Patient leans: N/A  Psychiatric Specialty Exam: Physical Exam  Nursing note and vitals reviewed. Constitutional: She appears well-developed and well-nourished.  HENT:  Head: Normocephalic and atraumatic.  Eyes: Pupils are equal, round, and reactive to light. Conjunctivae are normal.  Neck: Normal range of motion.  Cardiovascular: Regular rhythm and normal heart sounds.  Respiratory: Effort normal. No respiratory distress.  GI: Soft.  Musculoskeletal: Normal range of motion.  Neurological: She is alert.  Skin: Skin is warm and dry.  Psychiatric: She has a normal mood and affect. Her behavior is normal. Judgment and thought content normal.    Review of Systems  Constitutional: Negative.    HENT: Negative.   Eyes: Negative.   Respiratory: Negative.   Cardiovascular: Negative.   Gastrointestinal: Negative.   Musculoskeletal: Negative.   Skin: Negative.   Neurological: Negative.   Psychiatric/Behavioral: Negative.     Blood pressure 127/82, pulse 88, temperature 98.7 F (37.1 C), temperature source Oral, resp. rate 18, height 4\' 11"  (1.499 m), weight 101.6 kg, last menstrual period 07/28/2019, SpO2 100 %.Body mass index is 45.24 kg/m.  General Appearance: Casual  Eye Contact:  Fair  Speech:  Clear and Coherent  Volume:  Normal  Mood:  Euthymic  Affect:  Congruent  Thought Process:  Coherent  Orientation:  Full (Time, Place, and Person)  Thought Content:  Logical  Suicidal Thoughts:  No  Homicidal Thoughts:  No  Memory:  Immediate;   Fair Recent;   Fair Remote;   Fair  Judgement:  Fair  Insight:  Fair  Psychomotor Activity:  Normal  Concentration:  Concentration: Fair  Recall:  [employer]Fair  Fund of Knowledge:  Fair  Language:  Fair  Akathisia:  Negative  Handed:  Right  AIMS (if indicated):     Assets:  Desire for Improvement Housing Physical Health Social Support  ADL's:  Intact  Cognition:  WNL  Sleep:  Number of Hours: 6.5     Treatment Plan Summary: Daily contact with patient to assess and evaluate symptoms and progress in treatment, Medication management and Plan Patient was started on low-dose Zoloft and has tolerated that without difficulty.  Currently calm appropriate affect lucid no evidence of acute suicidality.  Plan will be for discharge tomorrow and recommended follow-up with outpatient treatment and her home county.  Mordecai RasmussenJohn Danni Shima, MD 08/04/2019, 5:36 PM

## 2019-08-04 NOTE — Plan of Care (Signed)
Pt rates depression and hopelessness both 2/10. Pt denies anxiety, SI, HI and AVH. Pt was educated on care plan and verbalizes understanding. Collier Bullock RN Problem: Education: Goal: Utilization of techniques to improve thought processes will improve Outcome: Progressing Goal: Knowledge of the prescribed therapeutic regimen will improve Outcome: Progressing   Problem: Activity: Goal: Interest or engagement in leisure activities will improve Outcome: Progressing Goal: Imbalance in normal sleep/wake cycle will improve Outcome: Progressing   Problem: Coping: Goal: Coping ability will improve Outcome: Progressing Goal: Will verbalize feelings Outcome: Progressing   Problem: Health Behavior/Discharge Planning: Goal: Ability to make decisions will improve Outcome: Progressing Goal: Compliance with therapeutic regimen will improve Outcome: Progressing   Problem: Role Relationship: Goal: Will demonstrate positive changes in social behaviors and relationships Outcome: Progressing   Problem: Safety: Goal: Ability to disclose and discuss suicidal ideas will improve Outcome: Progressing Goal: Ability to identify and utilize support systems that promote safety will improve Outcome: Progressing   Problem: Self-Concept: Goal: Will verbalize positive feelings about self Outcome: Progressing Goal: Level of anxiety will decrease Outcome: Progressing   Problem: Education: Goal: Ability to make informed decisions regarding treatment will improve Outcome: Progressing   Problem: Coping: Goal: Coping ability will improve Outcome: Progressing   Problem: Health Behavior/Discharge Planning: Goal: Identification of resources available to assist in meeting health care needs will improve Outcome: Progressing   Problem: Medication: Goal: Compliance with prescribed medication regimen will improve Outcome: Progressing   Problem: Self-Concept: Goal: Ability to disclose and discuss suicidal  ideas will improve Outcome: Progressing Goal: Will verbalize positive feelings about self Outcome: Progressing   Problem: Education: Goal: Ability to make informed decisions regarding treatment will improve Outcome: Progressing   Problem: Coping: Goal: Coping ability will improve Outcome: Progressing   Problem: Health Behavior/Discharge Planning: Goal: Identification of resources available to assist in meeting health care needs will improve Outcome: Progressing   Problem: Medication: Goal: Compliance with prescribed medication regimen will improve Outcome: Progressing   Problem: Self-Concept: Goal: Ability to disclose and discuss suicidal ideas will improve Outcome: Progressing Goal: Will verbalize positive feelings about self Outcome: Progressing

## 2019-08-04 NOTE — Progress Notes (Signed)
D: Patient has been calm and cooperative. Mood is pleasant. Affect is appropriate to circumstance. Denies SI, HI and AVH. Contracting for safety A: Continue to monitor for safety R: Safety maintained.

## 2019-08-04 NOTE — BHH Group Notes (Signed)
LCSW Group Therapy Note   08/04/2019 10:57 AM   Type of Therapy and Topic:  Group Therapy:  Overcoming Obstacles   Participation Level:  Active   Description of Group:    In this group patients will be encouraged to explore what they see as obstacles to their own wellness and recovery. They will be guided to discuss their thoughts, feelings, and behaviors related to these obstacles. The group will process together ways to cope with barriers, with attention given to specific choices patients can make. Each patient will be challenged to identify changes they are motivated to make in order to overcome their obstacles. This group will be process-oriented, with patients participating in exploration of their own experiences as well as giving and receiving support and challenge from other group members.   Therapeutic Goals: 1. Patient will identify personal and current obstacles as they relate to admission. 2. Patient will identify barriers that currently interfere with their wellness or overcoming obstacles.  3. Patient will identify feelings, thought process and behaviors related to these barriers. 4. Patient will identify two changes they are willing to make to overcome these obstacles:      Summary of Patient Progress Pt was appropriate and respectful in group. Pt was able to identify her current obstacle as attachment. Pt reported that discharge is also an obstacle for her. Pt reported that people are barriers for her and sometimes she doesn't stop to think about herself first and then she gets overwhelmed. Pt stated that physical pain is also an obstacle for her and she can overcome it by stretching and working out.      Therapeutic Modalities:   Cognitive Behavioral Therapy Solution Focused Therapy Motivational Interviewing Relapse Prevention Therapy  Evalina Field, MSW, LCSW Clinical Social Work 08/04/2019 10:57 AM

## 2019-08-04 NOTE — Progress Notes (Signed)
D - Patient was on the phone upon arrival to the unit. Patient was pleasant during assessment denying SI/HI/AVH, pain, anxiety and depression. Patient said she is hopeful she will be leaving tomorrow.   A - Patient didn't have any scheduled medications this evening. Patient given education. Patient given support and encouragement to be active in her treatment plan. Patient informed to let staff know if there are any issues or problems on the unit.   R - Patient being monitored Q 15 minutes for safety per unit protocol. Patient remains safe on the unit.

## 2019-08-04 NOTE — Progress Notes (Signed)
Recreation Therapy Notes    Date: 08/04/2019  Time: 9:30 am  Location: Craft room  Behavioral response: Appropriate   Intervention Topic: Goals   Discussion/Intervention:   Group content on today was focused on goals. Patients described what goals are and how they define goals. Individuals expressed how they go about setting goals and reaching them. The group identified how important goals are and if they make short term goals to reach long term goals. Patients described how many goals they work on at a time and what affects them not reaching their goal. Individuals described how much time they put into planning and obtaining their goals. The group participated in the intervention "My Goal Board" and made personal goal boards to help them achieve their goal. Clinical Observations/Feedback:  Patient came to group and defined goals as working toward something and having an Hydrographic surveyor. She stated that her goal is to go to [employer] school. Individual was social with peers and staff while participating in group.  [name] LRT/CTRS         Sandra [name] 08/04/2019 12:06 PM

## 2019-08-04 NOTE — Progress Notes (Signed)
Recreation Therapy Notes  INPATIENT RECREATION THERAPY ASSESSMENT  Patient Details Name: Chessica Audia MRN: [id number] DOB: [birth year] Today's Date: 08/04/2019       Information Obtained From: Patient  Able to Participate in Assessment/Interview: Yes  Patient Presentation: Responsive  Reason for Admission (Per Patient): Active Symptoms  Patient Stressors: Relationship, Work  Radiographer, therapeutic:   Music, Psychiatric nurse, Talk  Leisure Interests (2+):  Sports - Surveyor, mining, bake)  Frequency of Recreation/Participation: Monthly  Awareness of Community Resources:     [employer]:     Current Use:    If no, Barriers?:    Expressed Interest in [place] of Residence:  [place]  Patient Main Form of Transportation: Musician  Patient Strengths:  Kind, loyal, respectful  Patient Identified Areas of Improvement:  Be more independent  Patient Goal for Hospitalization:  Learn to manage emotions  Current SI (including self-harm):  No  Current HI:  No  Current AVH: No  Staff Intervention Plan: Collaborate with Interdisciplinary Treatment Team, Group Attendance  Consent to Intern Participation: N/A  Emelin Dascenzo 08/04/2019, 4:13 PM

## 2019-08-04 NOTE — Plan of Care (Signed)
Patient complaint with procedures on the unit.   Problem: Health Behavior/Discharge Planning: Goal: Compliance with therapeutic regimen will improve Outcome: Progressing

## 2019-08-04 NOTE — BHH Suicide Risk Assessment (Signed)
[place] INPATIENT:  Family/Significant Other Suicide Prevention Education  Suicide Prevention Education:  Education Completed; Tatyana Biber, sister [phone] has been identified by the patient as the family member/significant other with whom the patient will be residing, and identified as the person(s) who will aid the patient in the event of a mental health crisis (suicidal ideations/suicide attempt).  With written consent from the patient, the family member/significant other has been provided the following suicide prevention education, prior to the and/or following the discharge of the patient.  The suicide prevention education provided includes the following:  Suicide risk factors  Suicide prevention and interventions  National Suicide Hotline telephone number  [facility] assessment telephone number  [facility] Emergency Assistance [place] and/or Residential Mobile Crisis Unit telephone number  Request made of family/significant other to:  Remove weapons (e.g., guns, rifles, knives), all items previously/currently identified as safety concern.    Remove drugs/medications (over-the-counter, prescriptions, illicit drugs), all items previously/currently identified as a safety concern.  The family member/significant other verbalizes understanding of the suicide prevention education information provided.  The family member/significant other agrees to remove the items of safety concern listed above.  Sister reports no concerns for weapons in the home.  She reports a belief that patient "is not physically capable of harming herself or others, I think she just wants to mentally give up".  She reports that a trigger for the patient has been a recent break-up that was negative.  She reports no concerns for weapons from the patient.     Rozann Lesches 08/04/2019, 10:09 AM

## 2019-08-05 LAB — GLUCOSE, CAPILLARY: Glucose-Capillary: 119 mg/dL — ABNORMAL HIGH (ref 70–99)

## 2019-08-05 MED ORDER — CYANOCOBALAMIN 500 MCG PO TABS: 500.0000 ug | ORAL_TABLET | Freq: Every day | ORAL | 0 refills | Status: DC

## 2019-08-05 MED ORDER — HYDROXYZINE HCL 25 MG PO TABS: 25.0000 mg | ORAL_TABLET | Freq: Three times a day (TID) | ORAL | 0 refills | Status: DC | PRN

## 2019-08-05 MED ORDER — TRAZODONE HCL 50 MG PO TABS: 50.0000 mg | ORAL_TABLET | Freq: Every evening | ORAL | 0 refills | Status: DC | PRN

## 2019-08-05 MED ORDER — LISINOPRIL 2.5 MG PO TABS: 2.5000 mg | ORAL_TABLET | Freq: Every day | ORAL | 0 refills | Status: DC

## 2019-08-05 MED ORDER — SERTRALINE HCL 25 MG PO TABS: 25.0000 mg | ORAL_TABLET | Freq: Every day | ORAL | 0 refills | Status: DC

## 2019-08-05 MED ORDER — METFORMIN HCL 1000 MG PO TABS: 1000.0000 mg | ORAL_TABLET | Freq: Two times a day (BID) | ORAL | 0 refills | Status: DC

## 2019-08-05 NOTE — Plan of Care (Signed)
  Problem: Decision Making Goal: STG - Patient will successfully identify 2 ways of making healthy decisions post d/c within 5 recreation therapy group sessions Description: STG - Patient will successfully identify 2 ways of making healthy decisions post d/c within 5 recreation therapy group sessions Outcome: Completed/Met

## 2019-08-05 NOTE — BHH Counselor (Signed)
CSW spoke with Jerene Pitch at [employer] who reports she sent an email to [employer] therapist) yesterday to reschedule her appointment but she has not received a response. Jerene Pitch states she will attempt to contact [place] again today and follow up with CSW.

## 2019-08-05 NOTE — Plan of Care (Signed)
  Problem: Education: Goal: Utilization of techniques to improve thought processes will improve Outcome: Adequate for Discharge Goal: Knowledge of the prescribed therapeutic regimen will improve Outcome: Adequate for Discharge   Problem: Activity: Goal: Interest or engagement in leisure activities will improve Outcome: Adequate for Discharge Goal: Imbalance in normal sleep/wake cycle will improve Outcome: Adequate for Discharge   Problem: Coping: Goal: Coping ability will improve Outcome: Adequate for Discharge Goal: Will verbalize feelings Outcome: Adequate for Discharge   Problem: Role Relationship: Goal: Will demonstrate positive changes in social behaviors and relationships Outcome: Adequate for Discharge   Problem: Safety: Goal: Ability to disclose and discuss suicidal ideas will improve Outcome: Adequate for Discharge Goal: Ability to identify and utilize support systems that promote safety will improve Outcome: Adequate for Discharge   Problem: Self-Concept: Goal: Will verbalize positive feelings about self Outcome: Adequate for Discharge Goal: Level of anxiety will decrease Outcome: Adequate for Discharge   Problem: Education: Goal: Utilization of techniques to improve thought processes will improve Outcome: Adequate for Discharge Goal: Knowledge of the prescribed therapeutic regimen will improve Outcome: Adequate for Discharge   Problem: Activity: Goal: Interest or engagement in leisure activities will improve Outcome: Adequate for Discharge Goal: Imbalance in normal sleep/wake cycle will improve Outcome: Adequate for Discharge   Problem: Coping: Goal: Coping ability will improve Outcome: Adequate for Discharge Goal: Will verbalize feelings Outcome: Adequate for Discharge   Problem: Health Behavior/Discharge Planning: Goal: Ability to make decisions will improve Outcome: Adequate for Discharge Goal: Compliance with therapeutic regimen will  improve Outcome: Adequate for Discharge   Problem: Role Relationship: Goal: Will demonstrate positive changes in social behaviors and relationships Outcome: Adequate for Discharge   Problem: Safety: Goal: Ability to disclose and discuss suicidal ideas will improve Outcome: Adequate for Discharge Goal: Ability to identify and utilize support systems that promote safety will improve Outcome: Adequate for Discharge   Problem: Self-Concept: Goal: Will verbalize positive feelings about self Outcome: Adequate for Discharge Goal: Level of anxiety will decrease Outcome: Adequate for Discharge   Problem: Coping: Goal: Coping ability will improve Outcome: Adequate for Discharge   Problem: Health Behavior/Discharge Planning: Goal: Identification of resources available to assist in meeting health care needs will improve Outcome: Adequate for Discharge   Problem: Medication: Goal: Compliance with prescribed medication regimen will improve Outcome: Adequate for Discharge   Problem: Self-Concept: Goal: Ability to disclose and discuss suicidal ideas will improve Outcome: Adequate for Discharge Goal: Will verbalize positive feelings about self Outcome: Adequate for Discharge   Problem: Education: Goal: Ability to make informed decisions regarding treatment will improve Outcome: Adequate for Discharge

## 2019-08-05 NOTE — Progress Notes (Signed)
Recreation Therapy Notes    Date: 08/05/2019  Time: 9:30 am  Location: Craft room  Behavioral response: Appropriate   Intervention Topic: Stress Management   Discussion/Intervention:   Group content on today was focused on stress. The group defined stress and way to cope with stress. Participants expressed how they know when they are stresses out. Individuals described the different ways they have to cope with stress. The group stated reasons why it is important to cope with stress. Patient explained what good stress is and some examples. The group participated in the intervention "Stress Management Jeopardy". Individuals were separated into two group and answered questions related to stress.  Clinical Observations/Feedback:  Patient came to group and defined stress as difficult situations. Individual was social with peers and staff while participating in group.   Sandra [name] LRT/CTRS         Evalee Gerard 08/05/2019 11:32 AM

## 2019-08-05 NOTE — Progress Notes (Signed)
D: Patient is aware of  Discharge this shift .  A:Patient denies suicidal /homicidal ideations. Patient received all belongings brought in   No Storage medications. Writer reviewed Discharge Summary, Suicide Risk Assessment, and Transitional Record. Patient also received Prescriptions   from  MD. A 7 day supply of medications given to patient . Aware  Of follow up appointment .  R: Patient left unit with no questions  Or concerns  With  family 

## 2019-08-05 NOTE — BHH Suicide Risk Assessment (Signed)
[facility] Discharge Suicide Risk Assessment   Principal Problem: <principal problem not specified> Discharge Diagnoses: Active Problems:   Major depression, recurrent ([place])   Total Time spent with patient: 30 minutes  Musculoskeletal: Strength & Muscle Tone: within normal limits Gait & Station: normal Patient leans: N/A  Psychiatric Specialty Exam: Review of Systems  Constitutional: Negative.   HENT: Negative.   Eyes: Negative.   Respiratory: Negative.   Cardiovascular: Negative.   Gastrointestinal: Negative.   Musculoskeletal: Negative.   Skin: Negative.   Neurological: Negative.   Psychiatric/Behavioral: Negative.     Blood pressure 114/66, pulse (!) 101, temperature 98.6 F (37 C), temperature source Oral, resp. rate 18, height 4\' 11"  (1.499 m), weight 101.6 kg, last menstrual period 07/28/2019, SpO2 100 %.Body mass index is 45.24 kg/m.  General Appearance: Casual  Eye Contact::  Good  Speech:  Clear and Coherent409  Volume:  Normal  Mood:  Euthymic  Affect:  Congruent  Thought Process:  Coherent  Orientation:  Full (Time, Place, and Person)  Thought Content:  Logical  Suicidal Thoughts:  No  Homicidal Thoughts:  No  Memory:  Immediate;   Fair Recent;   Fair Remote;   Fair  Judgement:  Fair  Insight:  Fair  Psychomotor Activity:  Normal  Concentration:  Fair  Recall:  [employer] [place]  Language: Fair  Akathisia:  No  Handed:  Right  AIMS (if indicated):     Assets:  Desire for Improvement Housing Physical Health Social Support  Sleep:  Number of Hours: 8  Cognition: WNL  ADL's:  Intact   Mental Status Per Nursing Assessment::   On Admission:  NA  Demographic Factors:  Adolescent or young adult  Loss Factors: Loss of significant relationship  Historical Factors: Impulsivity  Risk Reduction Factors:   Religious beliefs about death, Employed, Living with another person, especially a relative, Positive social support and Positive  therapeutic relationship  Continued Clinical Symptoms:  Depression:   Impulsivity  Cognitive Features That Contribute To Risk:  None    Suicide Risk:  Minimal: No identifiable suicidal ideation.  Patients presenting with no risk factors but with morbid ruminations; may be classified as minimal risk based on the severity of the depressive symptoms  Follow-up Information    [place] Follow up.   Why: You are scheduled for a virtual session with Delia Chimes on Wednesday, November 18th at 2pm. Thank you. Contact information: [address] [place], [place] [address] Phone: [phone] Fax: [phone]          Plan Of Care/Follow-up recommendations:  Activity:  Activity as tolerated Diet:  Regular diet Other:  Follow-up outpatient treatment as recommended in [facility], MD 08/05/2019, 11:06 AM

## 2019-08-05 NOTE — Progress Notes (Signed)
Recreation Therapy Notes  INPATIENT RECREATION TR PLAN  Patient Details Name: Sandra [name] MRN: [id number] DOB: 09/17/1991 Today's Date: 08/05/2019  Rec Therapy Plan Is patient appropriate for Therapeutic Recreation?: Yes Treatment times per week: at leats3 Estimated Length of Stay: 5-7 days TR Treatment/Interventions: Group participation (Comment)  Discharge Criteria Pt will be discharged from therapy if:: Discharged Treatment plan/goals/alternatives discussed and agreed upon by:: Patient/family  Discharge Summary Short term goals set: Patient will successfully identify 2 ways of making healthy decisions post d/c within 5 recreation therapy group sessions Short term goals met: Adequate for discharge Progress toward goals comments: Groups attended Which groups?: Stress management, Goal setting Reason goals not met: N/A Therapeutic equipment acquired: N/A Reason patient discharged from therapy: Discharge from hospital Pt/family agrees with progress & goals achieved: Yes Date patient discharged from therapy: 08/05/19   Vanna Shavers 08/05/2019, 12:02 PM

## 2019-08-05 NOTE — Discharge Summary (Signed)
Physician Discharge Summary Note  Patient:  Sandra [name] is an 28 y.o., female MRN:  [id number] DOB:  17-Jan-1991 Patient phone:  [phone] (home)  Patient address:   [place] [address],  Total Time spent with patient: 30 minutes  Date of Admission:  08/02/2019 Date of Discharge: 08/05/2019  Reason for Admission:  Patient is seen and examined. Patient is a 28 year old female who presented to the [facility] emergency department on 07/31/2019 with suicidal ideation. The patient stated that she recently found out that her boyfriend was engaged to another female. She found this out from the Internet. She stated that she had gotten acutely depressed and acutely anxious and grabbed a knife but put it down. She has 2 children and her daughter lives with her, and she did not want to hurt her self. She did tell her sister that she did not "not want to be around". Her sister called 86 and she was taken to the hospital for evaluation. The patient was quite tearful and sad in the emergency room. She discussed the issues above. Decision was made to admit her to the hospital. She was transferred to our facility. She stated that she felt guilty and anxious about all that it occurred. She began to doubt herself. This became overwhelming. The patient admitted that she had previously been in treatment in therapy for anxiety and some obsessional type thinking. She stated that that went on for a while, but then she stopped it when she was feeling better. She denied any previous treatment with medications, she denied any previous suicide attempts, she denied any past family history of treatment or suicide attempts. She denied current suicidal ideation. She was admitted to the hospital for evaluation and stabilization.  Associated Signs/Symptoms: Depression Symptoms:  depressed mood, anhedonia, insomnia, psychomotor agitation, fatigue, feelings  of worthlessness/guilt, difficulty concentrating, hopelessness, suicidal thoughts with specific plan, anxiety, loss of energy/fatigue, disturbed sleep, (Hypo) Manic Symptoms:  Impulsivity, Anxiety Symptoms:  Excessive Worry, Psychotic Symptoms:  Denied PTSD Symptoms: Negative  Past Psychiatric History: Patient has had counseling for several years but never seen by a psychiatrist for medication management, never admitted to the hospital for evaluation or stabilization.  Principal Problem: <principal problem not specified> Discharge Diagnoses: Active Problems:   Major depression, recurrent ([place])  Past Psychiatric History:   Past Medical History:  Past Medical History:  Diagnosis Date  . Anemia   . Diabetes mellitus without complication ([place])   . GERD (gastroesophageal reflux disease)   . Hemorrhage after delivery of fetus    Intraoperative, received blood transfusion  . History of blood transfusion   . Hx of ectopic pregnancy     Past Surgical History:  Procedure Laterality Date  . CESAREAN SECTION    . CESAREAN SECTION  04/23/2012   Procedure: CESAREAN SECTION;  Surgeon: Donnamae Jude, MD;  Location: [facility];  Service: Gynecology;  Laterality: N/A;  . CHOLECYSTECTOMY  10/23/2012   Procedure: LAPAROSCOPIC CHOLECYSTECTOMY WITH INTRAOPERATIVE CHOLANGIOGRAM;  Surgeon: Pedro Earls, MD;  Location: [facility];  Service: General;  Laterality: N/A;  . LAPAROSCOPY FOR ECTOPIC PREGNANCY     Family History:  Family History  Problem Relation Age of Onset  . High blood pressure Mother   . Aneurysm Mother   . Hypertension Mother   . Drug abuse Mother   . Diabetes Mellitus II Father   . Hyperlipidemia Father   . Alcohol abuse Father   . Heart disease Maternal Aunt   .  Diabetes Paternal Grandmother   . Diabetes Paternal Grandfather   . Other Neg Hx    Family Psychiatric  History: Denies Social History:  Social History   Substance and Sexual Activity  Alcohol Use Yes    Comment: occasionally     Social History   Substance and Sexual Activity  Drug Use No    Social History   Socioeconomic History  . Marital status: Single    Spouse name: Not on file  . Number of children: Not on file  . Years of education: Not on file  . Highest education level: Not on file  Occupational History  . Not on file  Social Needs  . Financial resource strain: Not on file  . Food insecurity    Worry: Not on file    Inability: Not on file  . Transportation needs    Medical: Not on file    Non-medical: Not on file  Tobacco Use  . Smoking status: Former Research scientist (life sciences)  . Smokeless tobacco: Never Used  Substance and Sexual Activity  . Alcohol use: Yes    Comment: occasionally  . Drug use: No  . Sexual activity: Not Currently    Birth control/protection: Implant  Lifestyle  . Physical activity    Days per week: Not on file    Minutes per session: Not on file  . Stress: Not on file  Relationships  . Social Herbalist on phone: Not on file    Gets together: Not on file    Attends religious service: Not on file    Active member of club or organization: Not on file    Attends meetings of clubs or organizations: Not on file    Relationship status: Not on file  Other Topics Concern  . Not on file  Social History Narrative  . Not on file    Hospital Course:  Sandra [name] was admitted for <principal problem not specified> and crisis management.  She was treated with the following medications ZOloft 65m po daily for depression, Trazodone 568mpo qhs for insomnia, and Hydroxyzine 5014mo TID prn for anxiety.  Sandra [name] [name] with current medication and was instructed on how to take medications as prescribed; (details listed below under Medication List).  Medical problems were identified and treated as needed.  Home medications were restarted as appropriate.  Improvement was monitored by observation and Sandra [name] of symptom  reduction.  Emotional and mental status was monitored by daily self-inventory reports completed by Sandra [name].         Sandra [name] evaluated by the treatment team for stability and plans for continued recovery upon discharge.  Sandra [name] was an integral factor for scheduling further treatment.  Employment, transportation, bed availability, health status, family support, and any pending legal issues were also considered during her hospital stay.  She was offered further treatment options upon discharge including but not limited to Residential, Intensive Outpatient, and Outpatient treatment.  Sandra [name] with the services as listed below under Follow [name] Information.     Upon completion of this admission the Sandra [name] both mentally and medically stable for discharge denying suicidal/homicidal ideation, auditory/visual/tactile hallucinations, delusional thoughts and paranoia.      Physical Findings: AIMS: Facial and Oral Movements Muscles of Facial Expression: None, normal Lips and Perioral Area: None, normal Jaw: None, normal Tongue: None, normal,Extremity Movements Upper (arms, wrists, hands, fingers): None, normal Lower (  legs, knees, ankles, toes): None, normal,  , Overall Severity Severity of abnormal movements (highest score from questions above): None, normal Incapacitation due to abnormal movements: None, normal Patient's awareness of abnormal movements (rate only patient's [name]): No Awareness, Dental Status Current problems with teeth and/or dentures?: No Does patient usually wear dentures?: No  CIWA:  CIWA-Ar Total: 3 COWS:  COWS Total Score: 1  Musculoskeletal: Strength & Muscle Tone: within normal limits Gait & Station: normal Patient leans: N/A  Psychiatric Specialty Exam: See MD SRA Physical Exam  Review of Systems  Constitutional: Negative for weight loss.  All other systems reviewed and are  negative.   Blood pressure 114/66, pulse (!) 101, temperature 98.6 F (37 C), temperature source Oral, resp. rate 18, height '4\' 11"'  (1.499 m), weight 101.6 kg, last menstrual period 07/28/2019, SpO2 100 %.Body mass index is 45.24 kg/m.  Sleep:  Number of Hours: 8     Have you used any form of tobacco in the last 30 days? (Cigarettes, Smokeless Tobacco, Cigars, and/or Pipes): No  Has this patient used any form of tobacco in the last 30 days? (Cigarettes, Smokeless Tobacco, Cigars, and/or Pipes) , No  Blood Alcohol level:  Lab Results  Component Value Date   ETH <10 30/86/5784    Metabolic Disorder Labs:  Lab Results  Component Value Date   HGBA1C 11.1 (A) 04/11/2019   No results found for: PROLACTIN Lab Results  Component Value Date   CHOL 154 04/11/2019   TRIG 114 04/11/2019   HDL 49 04/11/2019   CHOLHDL 3.1 04/11/2019   [place] 82 04/11/2019    See Psychiatric Specialty Exam and Suicide Risk Assessment completed by Attending Physician prior to discharge.  Discharge destination:  Home  Is patient on multiple antipsychotic therapies at discharge:  No   Has Patient had three or more failed trials of antipsychotic monotherapy by history:  No  Recommended Plan for Multiple Antipsychotic Therapies: NA  Discharge Instructions    Discharge instructions   Complete by: As directed    Please continue to take medications as directed. If your symptoms return, worsen, or persist please call your 911, [name] to local ER, or contact crisis hotline. Please do not drink alcohol or use any illegal substances while taking prescription medications.     Allergies as of 08/05/2019      Reactions   Shellfish Allergy Anaphylaxis      Medication List    STOP taking these medications   benzonatate 100 MG capsule Commonly known as: TESSALON   fluticasone 50 MCG/ACT nasal spray Commonly known as: FLONASE   guaiFENesin-codeine 100-10 MG/5ML syrup   [place] 0.7 % Soln Generic drug:  Olopatadine HCl   Restasis 0.05 % ophthalmic emulsion Generic drug: cycloSPORINE     TAKE these medications     Indication  Accu-Chek FastClix Lancet Kit 1 Piece by Does not apply route 2 (two) times daily.  Indication: glucose montioring   albuterol 108 (90 Base) MCG/ACT inhaler Commonly known as: VENTOLIN HFA Inhale 1-2 puffs into the lungs every 6 (six) hours as needed for wheezing or shortness of breath.  Indication: Asthma   Dulaglutide 0.75 MG/0.5ML Sopn Inject 0.75 mg into the skin once a week.  Indication: Type 2 Diabetes   ferrous sulfate 325 (65 FE) MG tablet Take 1 tablet (325 mg total) by mouth every other day.  Indication: Anemia From Inadequate Iron in the Body, Iron Deficiency   glucose blood test strip Commonly known as: Accu-Chek Guide Use  to check blood sugar once daily or as instructed.  Indication: glucose montioring   glucose monitoring kit monitoring kit 1 each by Does not apply route as needed for other.  Indication: glucose montioring   hydrOXYzine 25 MG tablet Commonly known as: ATARAX/VISTARIL Take 1 tablet (25 mg total) by mouth 3 (three) times daily as needed for anxiety.  Indication: Feeling Anxious   Insulin Pen Needle 32G X 4 MM Misc 1 Piece by Does not apply route daily.  Indication: glucose montioring   lisinopril 2.5 MG tablet Commonly known as: ZESTRIL Take 1 tablet (2.5 mg total) by mouth daily. Start taking on: August 06, 2019  Indication: High Blood Pressure Disorder   metFORMIN 1000 MG tablet Commonly known as: GLUCOPHAGE Take 1 tablet (1,000 mg total) by mouth 2 (two) times daily with a meal.  Indication: Type 2 Diabetes   omeprazole 40 MG capsule Commonly known as: PRILOSEC TAKE 1 CAPSULE BY MOUTH EVERY DAY What changed:   how much to take  how to take this  when to take this  additional instructions  Indication: Heartburn, Gastroesophageal Reflux Disease   sertraline 25 MG tablet Commonly known as:  ZOLOFT Take 1 tablet (25 mg total) by mouth daily. Start taking on: August 06, 2019  Indication: Major Depressive Disorder   traZODone 50 MG tablet Commonly known as: DESYREL Take 1 tablet (50 mg total) by mouth at bedtime as needed for sleep.  Indication: Trouble Sleeping   vitamin B-12 500 MCG tablet Commonly known as: CYANOCOBALAMIN Take 1 tablet (500 mcg total) by mouth daily. Start taking on: August 06, 2019  Indication: Inadequate Vitamin B12      Follow-[name] Information    [facility] Follow [name].   Why: You are scheduled to meet with Delia Chimes on  Contact information: [address] [place], [place] [address] Phone: [phone] Fax: [phone]          Follow-[name] recommendations:  Activity:  Increase activity as tolerated Diet:  ROutine diet as directed.  Tests:  Routine testing as directed.  Other:  Even if you feel better, continue taking your medicine.    Signed: Suella Broad, FNP 08/05/2019, 10:22 AM

## 2019-08-05 NOTE — Progress Notes (Signed)
  [facility] Adult Case Management Discharge Plan :  Will you be returning to the same living situation after discharge:  Yes,  pt is returning home. At discharge, do you have transportation home?: Yes,  mother will provide transportation. Do you have the ability to pay for your medications: Yes,  [facility].  Release of information consent forms completed and in the chart;  Patient's signature needed at discharge.  Patient to Follow up at: Follow-up Information    [facility] Follow up.   Why: You are scheduled for a virtual session with Delia Chimes on Wednesday, November 18th at 2pm. Thank you. Contact information: [address] [place], [place] [address] Phone: [phone] Fax: [phone]          Next level of care provider has access to [place] and Suicide Prevention discussed: Yes,  SPE completed with pts sister.  Have you used any form of tobacco in the last 30 days? (Cigarettes, Smokeless Tobacco, Cigars, and/or Pipes): No  Has patient been referred to the Quitline?: Patient refused referral  Patient has been referred for addiction treatment: Pt. refused referral  Rozann Lesches, LCSW 08/05/2019, 10:34 AM

## 2019-08-05 NOTE — [facility] Group Notes (Signed)
LCSW Group Therapy Note  08/05/2019 1:00 PM  Type of Therapy/Topic:  Group Therapy:  Feelings about Diagnosis  Participation Level:  Did Not Attend   Description of Group:   This group will allow patients to explore their thoughts and feelings about diagnoses they have received. Patients will be guided to explore their level of understanding and acceptance of these diagnoses. Facilitator will encourage patients to process their thoughts and feelings about the reactions of others to their diagnosis and will guide patients in identifying ways to discuss their diagnosis with significant others in their lives. This group will be process-oriented, with patients participating in exploration of their own experiences, giving and receiving support, and processing challenge from other group members.   Therapeutic Goals: 1. Patient will demonstrate understanding of diagnosis as evidenced by identifying two or more symptoms of the disorder 2. Patient will be able to express two feelings regarding the diagnosis 3. Patient will demonstrate their ability to communicate their needs through discussion and/or role play  Summary of Patient Progress: X  Therapeutic Modalities:   Cognitive Behavioral Therapy Brief Therapy Feelings Identification   Jeane Cashatt, MSW, LCSW 08/05/2019 11:48 AM 

## 2019-08-12 ENCOUNTER — Telehealth (INDEPENDENT_AMBULATORY_CARE_PROVIDER_SITE_OTHER): Payer: Medicaid Other | Admitting: Family Medicine

## 2019-08-12 ENCOUNTER — Other Ambulatory Visit: Payer: Self-pay

## 2019-08-12 DIAGNOSIS — Z20822 Contact with and (suspected) exposure to covid-19: Secondary | ICD-10-CM

## 2019-08-12 DIAGNOSIS — F32A Depression, unspecified: Secondary | ICD-10-CM

## 2019-08-12 DIAGNOSIS — F329 Major depressive disorder, single episode, unspecified: Secondary | ICD-10-CM | POA: Diagnosis not present

## 2019-08-12 NOTE — Progress Notes (Signed)
Virtual Visit via Video Note  I connected with Sandra [name] on 08/12/19 at  3:10 PM EST by a video enabled telemedicine application and verified that I am speaking with the correct person using two identifiers.  Location: Patient: on phone Provider: [facility] clinic   I discussed the limitations of evaluation and management by telemedicine and the availability of in person appointments. The patient expressed understanding and agreed to proceed.  History of Present Illness: **paged patient via doximity for appt but she did not pick up**  **Patient called back, she stated that she recently got out of the hospital after being on psychiatric hold and that they had made some changes to her medication.  She was there after concern for suicidal ideation which she said is resolved and she feels she is in a better place mentally.  She says she is safe and breaks out to [place] if that changes.  She said that she wants to discuss some further changes to her medication particularly her reaction to Zoloft which she says she is not having problems with that she is not sure that it is sufficient.  We discussed that positive symptoms to psychiatric meds like this can take 4 to 6 weeks to truly appear that she is is not having negative symptoms she should continue this until she can meet with her primary care physician in a month.   Observations/Objective: Speaking full sentences, showing good insight to her condition.  No distress and appears to be emotionally stable at the time  Assessment and Plan: Appointment is scheduled for December 22 with her primary care doctor Dr. Sandi Carne, patient will continue Zoloft until then and has agreed to reach out to [place] if she has return of suicidal ideation.  Follow Up Instructions:    I discussed the assessment and treatment plan with the patient. The patient was provided an opportunity to ask questions and all were answered. The patient agreed with the plan and demonstrated  an understanding of the instructions.   The patient was advised to call back or seek an in-person evaluation if the symptoms worsen or if the condition fails to improve as anticipated.  I provided 11 minutes of non-face-to-face time during this encounter.   Sherene Sires, DO

## 2019-08-13 LAB — NOVEL CORONAVIRUS, NAA: SARS-CoV-2, NAA: NOT DETECTED

## 2019-08-19 ENCOUNTER — Encounter: Payer: Self-pay | Admitting: *Deleted

## 2019-09-04 ENCOUNTER — Other Ambulatory Visit: Payer: Self-pay | Admitting: *Deleted

## 2019-09-04 MED ORDER — LISINOPRIL 2.5 MG PO TABS: 2.5000 mg | ORAL_TABLET | Freq: Every day | ORAL | 0 refills | Status: DC

## 2019-09-04 MED ORDER — SERTRALINE HCL 25 MG PO TABS: 25.0000 mg | ORAL_TABLET | Freq: Every day | ORAL | 3 refills | Status: DC

## 2019-09-04 MED ORDER — CYANOCOBALAMIN 500 MCG PO TABS: 500.0000 ug | ORAL_TABLET | Freq: Every day | ORAL | 3 refills | Status: AC

## 2019-09-09 ENCOUNTER — Ambulatory Visit (INDEPENDENT_AMBULATORY_CARE_PROVIDER_SITE_OTHER): Payer: Medicaid Other | Admitting: Family Medicine

## 2019-09-09 ENCOUNTER — Encounter: Payer: Self-pay | Admitting: Family Medicine

## 2019-09-09 ENCOUNTER — Other Ambulatory Visit: Payer: Self-pay

## 2019-09-09 VITALS — BP 120/73 | HR 81 | Temp 98.3°F | Wt 223.0 lb

## 2019-09-09 DIAGNOSIS — F332 Major depressive disorder, recurrent severe without psychotic features: Secondary | ICD-10-CM | POA: Diagnosis not present

## 2019-09-09 DIAGNOSIS — E119 Type 2 diabetes mellitus without complications: Secondary | ICD-10-CM

## 2019-09-09 LAB — POCT GLYCOSYLATED HEMOGLOBIN (HGB A1C): HbA1c, POC (controlled diabetic range): 6 % (ref 0.0–7.0)

## 2019-09-09 NOTE — Patient Instructions (Signed)
Thank you for coming to see me today. It was a pleasure. Today we talked about:   Your mood: I am glad that you are feeling better.  I have checked with the pharmacist and have looked it up that it is fine for you to take your eyedrops with your Zoloft.  Continue seeing her counselor.  If you have other concerns please let me know.  Your diabetes: We have checked your A1c today, I will let you know what this shows.  Continue to take the lisinopril 2.5 mg for kidney protection.  Continue to take your medications.  We will check your electrolytes today to make sure they are okay with the start of your new medication.  Please follow-up with me in 3 months or sooner as needed.  If you have any questions or concerns, please do not hesitate to call the office at [phone].  Best,   [name], DO

## 2019-09-09 NOTE — Assessment & Plan Note (Signed)
Improving.  Denies SI today.  Overall states that she is feeling much better.  We will continue with Zoloft 25 mg daily at this moment.  Discussed that I cannot find any contraindication to using Restasis eyedrops with Zoloft.  Also discussed with Dr. Valentina Lucks, who is not aware of any contraindication for these 2 medications.  Advised patient to continue with her Restasis drops.  We will have her follow-up in 3 months, can consider titration Zoloft sooner if needed.

## 2019-09-09 NOTE — Assessment & Plan Note (Signed)
Currently well controlled, A1c improved from 11-6.0 since July 2020.  She reports compliance of Metformin, has continued on Trulicity 2.19 mg weekly.  Had BMP in November with stable creatinine, will also recheck today to ensure no change with addition of lisinopril 2.5 mg.  Called patient after visit after further thought and discussion with Dr. Vikki Ports regarding lisinopril for this patient.  Given her age, would not recommend that she be on ACE or ARB given possible teratogenicity.  Discussed this with the patient, who agreed.  Will discontinue lisinopril.  Patient to continue with current regimen and follow-up in 3 months.  She would like to discuss decreasing her diabetes regimen at that time, if A1c is stable, could consider this.

## 2019-09-09 NOTE — Progress Notes (Signed)
Subjective: Chief Complaint  Patient presents with  . Medication Management     HPI: Sandra [name] is a 28 y.o. presenting to clinic today to discuss the following:  1 F/U Depression Patient was admitted to [name] in November for Suicidal Ideation after finding out that things about her significant other.  She presents to discuss her medications.  Taking Zoloft 25mg  every day, was placed on this when she was at [name].  Hasn't taken trazodone, hasn't needed it for sleeping.  Has been following with a counselor and states that overall she is doing much better since her discharge in November.  She is wanting to know why she was told that she cannot take her eyedrops, including Restasis, with Zoloft.  She was told this when she left behavioral health.  She states that she has chronic dry eye and this is the only indication for eyedrops.  Denies SI today.      Office Visit from 09/09/2019 in [name]  PHQ-9 Total Score  5     GAD 7 : Generalized Anxiety Score 09/09/2019 07/28/2019 10/15/2017 09/04/2016  Nervous, Anxious, on Edge 1 3 2  0  Control/stop worrying 2 2 2 1   Worry too much - different things 1 3 1 2   Trouble relaxing 2 2 1 1   Restless 1 0 0 0  Easily annoyed or irritable 3 3 1 1   Afraid - awful might happen 1 3 1 3   Total GAD 7 Score 11 16 8 8   Anxiety Difficulty Not difficult at all - - Somewhat difficult   This note is not being shared with the patient for the following reason: To prevent harm (release of this note would result in harm to the life or physical safety of the patient or another).   2 Diabetes Patient on dulaglutide 0.75 mg weekly and Metformin 1000 mg twice daily.  A1c today 6.0.  Patient reports that she was put on lisinopril 2.5 mg at behavioral health because her psychiatrist told her she needed to be on it due to diabetes.  She wanted to discuss the reason for this and know if she needs to continue taking it.  Reports  that she has been doing well from a diabetes perspective, denies any hypoglycemic episodes.  Health Maintenance: Needs ophthalmology exam, foot exam     ROS noted in HPI. Chief complaint noted.  Other Pertinent PMH: Diabetes, obesity, depression Past Medical, Surgical, Social, and Family History Reviewed & Updated per EMR.      Social History   Tobacco Use  Smoking Status Former Smoker  Smokeless Tobacco Never Used   Smoking status noted.    Objective: BP 120/73   Pulse 81   Temp 98.3 F (36.8 C) (Oral)   Wt 223 lb (101.2 kg)   LMP 09/05/2019   SpO2 94%   BMI 45.04 kg/m  Vitals and nursing notes reviewed  Physical Exam:  General: 28 y.o. female in NAD Lungs: Breathing comfortably on room air Skin: warm and dry Extremities: Moving all 4 extremities equally Psych: Denies SI, thought process linear and logical, smiling and laughing during encounter, mood and affect appropriate for circumstance   Results for orders placed or performed in visit on 09/09/19 (from the past 72 hour(s))  HgB A1c     Status: None   Collection Time: 09/09/19 11:54 AM  Result Value Ref Range   Hemoglobin A1C     HbA1c POC (<> result, manual entry)  HbA1c, POC (prediabetic range)     HbA1c, POC (controlled diabetic range) 6.0 0.0 - 7.0 %    Assessment/Plan:  Diabetes mellitus without complication, without long-term current use of insulin ([name]) Currently well controlled, A1c improved from 11-6.0 since July 2020.  She reports compliance of Metformin, has continued on Trulicity 7.82 mg weekly.  Had BMP in November with stable creatinine, will also recheck today to ensure no change with addition of lisinopril 2.5 mg.  Called patient after visit after further thought and discussion with Dr. Vikki Ports regarding lisinopril for this patient.  Given her age, would not recommend that she be on ACE or ARB given possible teratogenicity.  Discussed this with the patient, who agreed.  Will discontinue  lisinopril.  Patient to continue with current regimen and follow-up in 3 months.  She would like to discuss decreasing her diabetes regimen at that time, if A1c is stable, could consider this.  Major depression, recurrent ([place]) Improving.  Denies SI today.  Overall states that she is feeling much better.  We will continue with Zoloft 25 mg daily at this moment.  Discussed that I cannot find any contraindication to using Restasis eyedrops with Zoloft.  Also discussed with Dr. Valentina Lucks, who is not aware of any contraindication for these 2 medications.  Advised patient to continue with her Restasis drops.  We will have her follow-up in 3 months, can consider titration Zoloft sooner if needed.     PATIENT EDUCATION PROVIDED: See AVS    Diagnosis and plan along with any newly prescribed medication(s) were discussed in detail with this patient today. The patient verbalized understanding and agreed with the plan. Patient advised if symptoms worsen return to clinic or ER.   Health Maintainance: Recommend dilated eye exam   Orders Placed This Encounter  Procedures  . Basic Metabolic Panel  . HgB A1c    No orders of the defined types were placed in this encounter.    [name], DO 09/09/2019, 3:18 PM PGY-2 [place]

## 2019-09-10 LAB — BASIC METABOLIC PANEL
BUN/Creatinine Ratio: 25 — ABNORMAL HIGH (ref 9–23)
BUN: 13 mg/dL (ref 6–20)
CO2: 21 mmol/L (ref 20–29)
Calcium: 9 mg/dL (ref 8.7–10.2)
Chloride: 102 mmol/L (ref 96–106)
Creatinine, Ser: 0.52 mg/dL — ABNORMAL LOW (ref 0.57–1.00)
GFR calc Af Amer: 149 mL/min/{1.73_m2} (ref >59)
GFR calc non Af Amer: 130 mL/min/{1.73_m2} (ref >59)
Glucose: 87 mg/dL (ref 65–99)
Potassium: 4.2 mmol/L (ref 3.5–5.2)
Sodium: 138 mmol/L (ref 134–144)

## 2019-09-23 DIAGNOSIS — F4325 Adjustment disorder with mixed disturbance of emotions and conduct: Secondary | ICD-10-CM | POA: Diagnosis not present

## 2019-10-04 ENCOUNTER — Other Ambulatory Visit: Payer: Self-pay | Admitting: Family Medicine

## 2019-10-04 DIAGNOSIS — K219 Gastro-esophageal reflux disease without esophagitis: Secondary | ICD-10-CM

## 2019-11-14 ENCOUNTER — Other Ambulatory Visit: Payer: Self-pay | Admitting: *Deleted

## 2019-11-17 MED ORDER — METFORMIN HCL 1000 MG PO TABS: 1000.0000 mg | ORAL_TABLET | Freq: Two times a day (BID) | ORAL | 3 refills | Status: DC

## 2019-11-19 ENCOUNTER — Other Ambulatory Visit (HOSPITAL_COMMUNITY)
Admission: RE | Admit: 2019-11-19 | Discharge: 2019-11-19 | Disposition: A | Discharge status: Home / Self Care | Payer: Medicaid Other | Source: Ambulatory Visit | Attending: Family Medicine | Admitting: Family Medicine

## 2019-11-19 ENCOUNTER — Other Ambulatory Visit: Payer: Self-pay

## 2019-11-19 ENCOUNTER — Other Ambulatory Visit: Payer: Self-pay | Admitting: Family Medicine

## 2019-11-19 ENCOUNTER — Encounter: Payer: Self-pay | Admitting: Family Medicine

## 2019-11-19 ENCOUNTER — Telehealth: Payer: Self-pay | Admitting: Family Medicine

## 2019-11-19 ENCOUNTER — Ambulatory Visit (INDEPENDENT_AMBULATORY_CARE_PROVIDER_SITE_OTHER): Payer: Medicaid Other | Admitting: Family Medicine

## 2019-11-19 VITALS — BP 125/70 | HR 75 | Wt 245.6 lb

## 2019-11-19 DIAGNOSIS — B9689 Other specified bacterial agents as the cause of diseases classified elsewhere: Secondary | ICD-10-CM | POA: Diagnosis not present

## 2019-11-19 DIAGNOSIS — N898 Other specified noninflammatory disorders of vagina: Secondary | ICD-10-CM

## 2019-11-19 DIAGNOSIS — N760 Acute vaginitis: Secondary | ICD-10-CM

## 2019-11-19 DIAGNOSIS — Z113 Encounter for screening for infections with a predominantly sexual mode of transmission: Secondary | ICD-10-CM

## 2019-11-19 LAB — POCT WET PREP (WET MOUNT)
Clue Cells Wet Prep Whiff POC: NEGATIVE
Trichomonas Wet Prep HPF POC: ABSENT

## 2019-11-19 LAB — POCT URINE PREGNANCY: Preg Test, Ur: NEGATIVE

## 2019-11-19 NOTE — Telephone Encounter (Signed)
Called pt to inform her of wet prep results. Explained GC test is still pending and I will contact her when they are back.

## 2019-11-19 NOTE — Patient Instructions (Signed)
Hi Ms Altmann,  It was lovely to see you today!! We did an STD check today, I will call you with the results. Please do not hesitate to contact me if you have further questions, I would be happy to answer them!!   Best wishes,   Dr Allena Katz

## 2019-11-20 ENCOUNTER — Other Ambulatory Visit: Payer: Self-pay | Admitting: Family Medicine

## 2019-11-20 ENCOUNTER — Telehealth: Payer: Self-pay | Admitting: Family Medicine

## 2019-11-20 LAB — HIV ANTIBODY (ROUTINE TESTING W REFLEX): HIV Screen 4th Generation wRfx: NONREACTIVE

## 2019-11-20 LAB — CERVICOVAGINAL ANCILLARY ONLY
Bacterial Vaginitis (gardnerella): POSITIVE — AB
Candida Glabrata: NEGATIVE
Candida Vaginitis: NEGATIVE
Chlamydia: NEGATIVE
Comment: NEGATIVE
Comment: NEGATIVE
Comment: NEGATIVE
Comment: NEGATIVE
Comment: NEGATIVE
Comment: NORMAL
Neisseria Gonorrhea: NEGATIVE
Trichomonas: NEGATIVE

## 2019-11-20 LAB — RPR: RPR Ser Ql: NONREACTIVE

## 2019-11-20 LAB — HEPATITIS C ANTIBODY: Hep C Virus Ab: <0.1 s/co ratio (ref 0.0–0.9)

## 2019-11-20 LAB — HEPATITIS B SURFACE ANTIBODY,QUALITATIVE: Hep B Surface Ab, Qual: NONREACTIVE

## 2019-11-20 MED ORDER — METRONIDAZOLE 500 MG PO TABS: 500.0000 mg | ORAL_TABLET | Freq: Two times a day (BID) | ORAL | 0 refills | Status: DC

## 2019-11-20 NOTE — Assessment & Plan Note (Addendum)
STD screen negative.Positive for BV. Contacted patient and prescribed Metronidazole 500mg  BID for 7 days. Recommended to abstain from sexual intercourse until infection has cleared and to avoid drinking ETOH during this time.

## 2019-11-20 NOTE — Telephone Encounter (Signed)
Called pt to inform her she has BV and that her other STDs results were negative. Metronidazole sent to pharmacy.

## 2019-11-20 NOTE — Progress Notes (Signed)
    SUBJECTIVE:   CHIEF COMPLAINT / HPI:   Sandra [name] is a 29 yr old female who presents today for an STD check   STD check Pt recently had unprotected sexual intercourse with one female and is worried about catching an STD. Her sexual partner denies having an STD but she wants to be sure. Denies previous STDs. Denies abnormal vaginal discharge or vaginal bleeding. LMP Feb 7th.   Menstrual cycles are regular.   PERTINENT  PMH / PSH: DM, GERD, morbid obesity, Depression   OBJECTIVE:   BP 125/70   Pulse 75   Wt 245 lb 9.6 oz (111.4 kg)   SpO2 100%   BMI 49.61 kg/m   Chaperoned by CMA Shelly. Assisted by Dr Manson Passey  Pelvic exam: normal external genitalia, no adnexal tenderness, no obvious pelvic masses felt, exam limited due to body habitus. Wet prep and G&C swabs taken  Upreg: negative STD labs taken: HIV, hep b, hep c, RPR  ASSESSMENT/PLAN:   Encounter for screening examination for sexually transmitted disease STD screen negative.Positive for BV. Contacted patient and prescribed Metronidazole 500mg  BID for 7 days. Recommended to abstain from sexual intercourse until infection has cleared and to avoid drinking ETOH during this time.     , MD [facility]

## 2019-11-21 ENCOUNTER — Telehealth: Payer: Self-pay | Admitting: Family Medicine

## 2019-11-21 NOTE — Telephone Encounter (Signed)
Contacted patient regarding date of unprotected intercourse. She said it was on Mon 1st March and last menstrual cycle was 7th Feb. Explained it was too late for plan B and that she monitor her menstrual cycle. If she misses her menstrual cycle she should take a pregnancy test.

## 2019-12-11 ENCOUNTER — Ambulatory Visit: Payer: Medicaid Other

## 2019-12-30 ENCOUNTER — Other Ambulatory Visit: Payer: Self-pay | Admitting: Family Medicine

## 2019-12-30 DIAGNOSIS — K219 Gastro-esophageal reflux disease without esophagitis: Secondary | ICD-10-CM

## 2020-02-13 IMAGING — DX DG CHEST 1V PORT
1 series · 1 of 1 positions shown · non-contrast
Comparison: 08/24/2018

CLINICAL DATA: Cough

EXAM:
PORTABLE CHEST 1 VIEW

[chest ap]
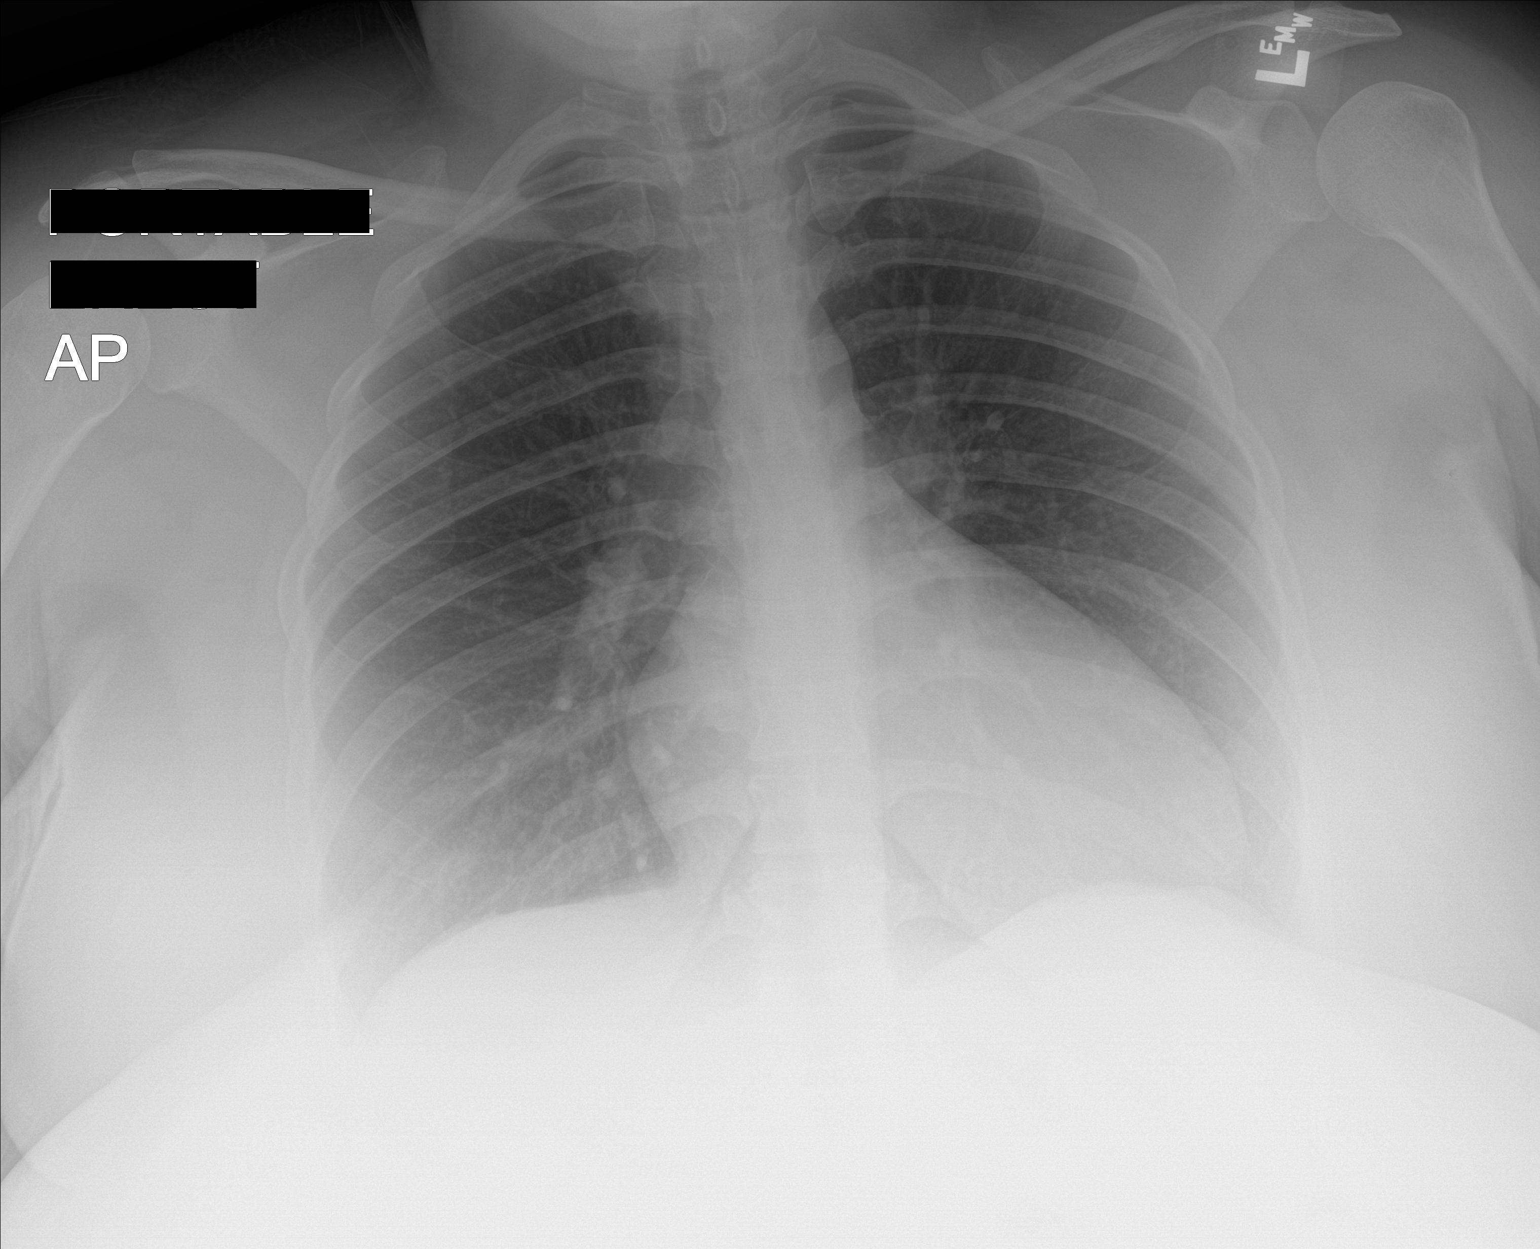

[1 of 1 positions shown; findings below may reference images not displayed]

FINDINGS: The heart size and mediastinal contours are within normal limits.
Both lungs are clear. The visualized skeletal structures are
unremarkable.
IMPRESSION: No acute abnormality of the lungs in AP portable projection.

## 2020-03-29 ENCOUNTER — Other Ambulatory Visit: Payer: Self-pay

## 2020-03-29 DIAGNOSIS — E119 Type 2 diabetes mellitus without complications: Secondary | ICD-10-CM

## 2020-03-29 MED ORDER — DULAGLUTIDE 0.75 MG/0.5ML ~~LOC~~ SOAJ: 0.7500 mg | SUBCUTANEOUS | 3 refills | Status: DC

## 2020-05-14 ENCOUNTER — Encounter: Payer: Medicaid Other | Admitting: Family Medicine

## 2020-05-14 NOTE — Progress Notes (Deleted)
    SUBJECTIVE:   CHIEF COMPLAINT / HPI:   Depression Patient admitted to behavioral health in November for SI *** Currently on Zoloft 25 mg daily***  Type 2 diabetes Current regimen: Dulaglutide 0.75 mg weekly, Metformin 1000 mg twice daily Last A1c in December 6 0.0 Denies hypoglycemic episodes, polyuria, polydipsia*** Eye exam***  PERTINENT  PMH / PSH: ***  OBJECTIVE:   There were no vitals taken for this visit.  ***  ASSESSMENT/PLAN:   No problem-specific Assessment & Plan notes found for this encounter.     Unknown Jim, DO [facility]

## 2020-05-25 NOTE — Progress Notes (Signed)
SUBJECTIVE:   CHIEF COMPLAINT / HPI:   Vaginal Discharge Patient reports that discharge started 1 week.  She notes that discharge appears white, sometimes brown.  She endorses vaginal odors.  Has been having random spotting about a month.  She endorses vaginal pruritis, denies dysuria, hematuria, pelvic pain, nausea, vomiting, fevers.   She has used boric acid with no relief, thinks it was made worse.   Has history of STIs.  She is sexually active and does not use condoms.  Contraception: none.  Patient's last menstrual period was 04/29/2020. She does not douche.  Wants HIV/RPR checked, declines oral/anal swab.  Contraception Counseling  Sexually active: yes LMP: 8/12 Intercourse in the last 2 weeks: yes Attempting to conceive: no Requests STI testing: yes History of STI: yes Not smoking, no migraine with aura, no personal or family history of blood clots  Fatigue Wants checked for vit d deficiency as she is worried that she may have it because she is tired No history of vit d deficiency  PERTINENT  PMH / PSH: T2DM, GERD, Depression  OBJECTIVE:   BP 122/82    Pulse 88    Ht 4\' 11"  (1.499 m)    Wt 246 lb (111.6 kg)    LMP 04/29/2020    SpO2 98%    BMI 49.69 kg/m    Physical Exam:  General: 29 y.o. female in NAD Lungs: Breathing comfortably on RA Skin: warm and dry Extremities: No edema GU: Pelvic exam performed with patient supine.  Chaperone in room.  Bilateral labia without abnormalities.  Cervix exhibits no discharge, no cervix abnormalities.  No vaginal lesions.  Vaginal discharge thin, white, moderate amount.   Results for orders placed or performed in visit on 05/26/20 (from the past 24 hour(s))  POCT urine pregnancy     Status: None   Collection Time: 05/26/20 10:14 AM  Result Value Ref Range   Preg Test, Ur Negative Negative  POCT Wet Prep 07/26/20 [place])     Status: Abnormal   Collection Time: 05/26/20 10:30 AM  Result Value Ref Range   Source Wet Prep POC vaginal     WBC, Wet Prep HPF POC 1-5    Bacteria Wet Prep HPF POC Many (A) Few   Clue Cells Wet Prep HPF POC None None   Clue Cells Wet Prep Whiff POC Negative Whiff    Yeast Wet Prep HPF POC Few (A) None   KOH Wet Prep POC None None   Trichomonas Wet Prep HPF POC Absent Absent      ASSESSMENT/PLAN:   Vaginal discharge Wet prep consistent with BV given many bacteria and presentation/exam consistent.  Will treat per below.  G/C ordering and pending.    Screen for sexually transmitted diseases Neg trichomonas on wet prep, G/C pending.  HIV/RPR ordered.  Bacterial vaginosis Wet prep with many bacteria, exam and history consistent with bacterial vaginosis.  Will treat with flagyl 500mg  BID x 7 days.  Also advised that vaginal probiotics can help decrease recurrent bacterial vaginosis.    Fatigue Will order Vit D at patient's request to further assess.  She does not seem particularly interested in exploring other causes at this time, but if Vit D is normal and continues, could consider TSH.  Initial encounter for management of contraceptive patch use The risks and benefits were dicussed with patient regarding the following forms of birth control: oral contraceptive pills, Depo-Provera shot, Nuva-Ring, hormonal IUD, copper IUD, Nexplanon, and condoms.  The patient decided  on patches.  She was counseled on possible side effects of this method.  She denies a history of migraines with aura, DVT, PE, and family history of DVT or PE.  Urine pregnancy test was negative but advised to not start until first day of her next period to ensure she is not pregnant.  Diabetes mellitus without complication, without long-term current use of insulin (HCC) Will check BMP today since also getting blood for HIV and RPR.  Patient to come back in 1 month to further assess diabetes and have A1c.     Unknown Jim, DO [facility]

## 2020-05-26 ENCOUNTER — Other Ambulatory Visit: Payer: Self-pay

## 2020-05-26 ENCOUNTER — Encounter: Payer: Self-pay | Admitting: Family Medicine

## 2020-05-26 ENCOUNTER — Ambulatory Visit (INDEPENDENT_AMBULATORY_CARE_PROVIDER_SITE_OTHER): Payer: Medicaid Other | Admitting: Family Medicine

## 2020-05-26 ENCOUNTER — Other Ambulatory Visit (HOSPITAL_COMMUNITY)
Admission: RE | Admit: 2020-05-26 | Discharge: 2020-05-26 | Disposition: A | Discharge status: Home / Self Care | Payer: Medicaid Other | Source: Ambulatory Visit | Attending: Family Medicine | Admitting: Family Medicine

## 2020-05-26 VITALS — BP 122/82 | HR 88 | Ht 59.0 in | Wt 246.0 lb

## 2020-05-26 DIAGNOSIS — N760 Acute vaginitis: Secondary | ICD-10-CM | POA: Diagnosis not present

## 2020-05-26 DIAGNOSIS — N898 Other specified noninflammatory disorders of vagina: Secondary | ICD-10-CM | POA: Diagnosis not present

## 2020-05-26 DIAGNOSIS — B9689 Other specified bacterial agents as the cause of diseases classified elsewhere: Secondary | ICD-10-CM | POA: Insufficient documentation

## 2020-05-26 DIAGNOSIS — Z23 Encounter for immunization: Secondary | ICD-10-CM | POA: Diagnosis not present

## 2020-05-26 DIAGNOSIS — Z113 Encounter for screening for infections with a predominantly sexual mode of transmission: Secondary | ICD-10-CM | POA: Diagnosis not present

## 2020-05-26 DIAGNOSIS — Z3045 Encounter for surveillance of transdermal patch hormonal contraceptive device: Secondary | ICD-10-CM

## 2020-05-26 DIAGNOSIS — R5383 Other fatigue: Secondary | ICD-10-CM

## 2020-05-26 DIAGNOSIS — E119 Type 2 diabetes mellitus without complications: Secondary | ICD-10-CM | POA: Diagnosis not present

## 2020-05-26 LAB — POCT WET PREP (WET MOUNT)
Clue Cells Wet Prep Whiff POC: NEGATIVE
Trichomonas Wet Prep HPF POC: ABSENT

## 2020-05-26 LAB — POCT URINE PREGNANCY: Preg Test, Ur: NEGATIVE

## 2020-05-26 MED ORDER — METRONIDAZOLE 500 MG PO TABS: 500.0000 mg | ORAL_TABLET | Freq: Two times a day (BID) | ORAL | 0 refills | Status: AC

## 2020-05-26 MED ORDER — NORELGESTROMIN-ETH ESTRADIOL 150-35 MCG/24HR TD PTWK: 1.0000 | MEDICATED_PATCH | TRANSDERMAL | 12 refills | Status: DC

## 2020-05-26 NOTE — Assessment & Plan Note (Signed)
The risks and benefits were dicussed with patient regarding the following forms of birth control: oral contraceptive pills, Depo-Provera shot, Nuva-Ring, hormonal IUD, copper IUD, Nexplanon, and condoms.  The patient decided on patches.  She was counseled on possible side effects of this method.  She denies a history of migraines with aura, DVT, PE, and family history of DVT or PE.  Urine pregnancy test was negative but advised to not start until first day of her next period to ensure she is not pregnant.

## 2020-05-26 NOTE — Assessment & Plan Note (Signed)
Neg trichomonas on wet prep, G/C pending.  HIV/RPR ordered.

## 2020-05-26 NOTE — Assessment & Plan Note (Signed)
Wet prep with many bacteria, exam and history consistent with bacterial vaginosis.  Will treat with flagyl 500mg  BID x 7 days.  Also advised that vaginal probiotics can help decrease recurrent bacterial vaginosis.

## 2020-05-26 NOTE — Patient Instructions (Signed)
Thank you for coming to see me today. It was a pleasure. Today we talked about:   Your vaginal discharge: We performed STD testing today. This will take a few days to come back. If your MyChart is activated, we will message you on there if everything is normal, otherwise we will call. If we need to treat something we will also call you. If you do not hear from [place] in the next 4 days, please give [place] a call.  We will get some labs today.  If they are abnormal or we need to do something about them, I will call you.  If they are normal, I will send you a message on MyChart (if it is active) or a letter in the mail.  If you don't hear from [place] in 2 weeks, please call the office at the number below.  Please follow-up with me in 1 month or sooner for your diabetes and other health maintenance.  If you have any questions or concerns, please do not hesitate to call the office at [phone].  Best,   Luis Abed, DO

## 2020-05-26 NOTE — Assessment & Plan Note (Signed)
Wet prep consistent with BV given many bacteria and presentation/exam consistent.  Will treat per below.  G/C ordering and pending.

## 2020-05-26 NOTE — Assessment & Plan Note (Signed)
Will check BMP today since also getting blood for HIV and RPR.  Patient to come back in 1 month to further assess diabetes and have A1c.

## 2020-05-26 NOTE — Assessment & Plan Note (Addendum)
Will order Vit D at patient's request to further assess.  She does not seem particularly interested in exploring other causes at this time, but if Vit D is normal and continues, could consider TSH.

## 2020-05-27 ENCOUNTER — Other Ambulatory Visit: Payer: Self-pay | Admitting: Family Medicine

## 2020-05-27 DIAGNOSIS — E559 Vitamin D deficiency, unspecified: Secondary | ICD-10-CM

## 2020-05-27 LAB — CERVICOVAGINAL ANCILLARY ONLY
Chlamydia: NEGATIVE
Comment: NEGATIVE
Comment: NORMAL
Neisseria Gonorrhea: NEGATIVE

## 2020-05-27 LAB — RPR: RPR Ser Ql: NONREACTIVE

## 2020-05-27 LAB — BASIC METABOLIC PANEL
BUN/Creatinine Ratio: 13 (ref 9–23)
BUN: 9 mg/dL (ref 6–20)
CO2: 20 mmol/L (ref 20–29)
Calcium: 9.2 mg/dL (ref 8.7–10.2)
Chloride: 104 mmol/L (ref 96–106)
Creatinine, Ser: 0.72 mg/dL (ref 0.57–1.00)
GFR calc Af Amer: 130 mL/min/{1.73_m2} (ref >59)
GFR calc non Af Amer: 113 mL/min/{1.73_m2} (ref >59)
Glucose: 119 mg/dL — ABNORMAL HIGH (ref 65–99)
Potassium: 4.1 mmol/L (ref 3.5–5.2)
Sodium: 140 mmol/L (ref 134–144)

## 2020-05-27 LAB — VITAMIN D 25 HYDROXY (VIT D DEFICIENCY, FRACTURES): Vit D, 25-Hydroxy: 11.8 ng/mL — ABNORMAL LOW (ref 30.0–100.0)

## 2020-05-27 LAB — HIV ANTIBODY (ROUTINE TESTING W REFLEX): HIV Screen 4th Generation wRfx: NONREACTIVE

## 2020-05-27 MED ORDER — VITAMIN D (ERGOCALCIFEROL) 1.25 MG (50000 UNIT) PO CAPS: 50000.0000 [IU] | ORAL_CAPSULE | ORAL | 0 refills | Status: DC

## 2020-05-27 NOTE — Progress Notes (Signed)
Vit D deficiency.  Will take 76811 units x 6 weeks, then repeat and see if can decrease to OTC

## 2020-06-01 DIAGNOSIS — H40033 Anatomical narrow angle, bilateral: Secondary | ICD-10-CM | POA: Diagnosis not present

## 2020-06-01 DIAGNOSIS — H04123 Dry eye syndrome of bilateral lacrimal glands: Secondary | ICD-10-CM | POA: Diagnosis not present

## 2020-07-06 NOTE — Progress Notes (Signed)
SUBJECTIVE:   CHIEF COMPLAINT / HPI:   T2DM Current regimen: metformin 1000mg  BID She has not been taking trulicity since July because she was out and couldn't get a refill Last A1c 6.0 on 09/07/2019 CBGs: doesn't check Last BMP on 05/26/2020, creatinine stable electrolytes within normal limits Last lipid panel 04/11/2019, LDL 82 at that time Denies polyuria, polydipsia, hypoglycemic symptoms Eye exam last month Needs a foot exam She has been working very hard on exercising at least 2 times a week and drink smoothies every day for breakfast which she states helps curb her appetite She has lost 7 pounds in the last month  Vitamin D deficiency Vitamin D checked on 05/26/2020, was 11.8 Started her on vitamin D 50,000 units q. Weekly  Vaginal Discharge At the end of the visit she notes that she has symptoms of bacterial vaginosis again for the last few days and is asking if she can be prescribed a treatment, but doesn't want a vaginal exam today No other complaints, feeling well, no fevers or pelvic pain Has been taking vaginal probiotic  Getting second COVID vaccine today   PERTINENT  PMH / PSH: T2DM, GERD, depression, vitamin D deficiency  OBJECTIVE:   BP 120/70   Pulse 87   Ht 4\' 11"  (1.499 m)   Wt 239 lb 2 oz (108.5 kg)   LMP 06/23/2020   SpO2 99%   BMI 48.30 kg/m    Physical Exam:  General: 29 y.o. female in NAD Lungs: Breathing comfortably on room air Skin: warm and dry Extremities: No edema, ambulating without difficulty  Diabetic Foot Check -  Appearance - no lesions, ulcers or calluses Skin - no unusual pallor or redness Monofilament testing - normal bilaterally  Right - Great toe, medial, central, lateral ball and posterior foot intact Left - Great toe, medial, central, lateral ball and posterior foot intact    Results for orders placed or performed in visit on 07/07/20 (from the past 24 hour(s))  HgB A1c     Status: None   Collection Time: 07/07/20   9:34 AM  Result Value Ref Range   Hemoglobin A1C     HbA1c POC (<> result, manual entry)     HbA1c, POC (prediabetic range)     HbA1c, POC (controlled diabetic range) 6.8 0.0 - 7.0 %      ASSESSMENT/PLAN:   Diabetes mellitus without complication, without long-term current use of insulin (HCC) A1c today 6.8, which is still below her goal of 7 given her age.  She is doing significantly well.  She was congratulated on the healthy lifestyle changes that she is making.  Given that her A1c is still well controlled without Victoza in the last 3 months, will not restart this.  She can continue on Metformin 1000 mg twice daily.  We will have her come back in 3 months, if her A1c remains well controlled, can space these visits out.  Given her age, would not obtain another lipid panel at this time as she received 1 in July 2020, and would not obviously benefit from a statin.  She does not meet criteria for a/ARB therapy either.  Foot exam was performed today.  Asked that she have her eye doctor fax her diabetic eye records to 07/09/20.  Vitamin D deficiency For now, we will continue 50,000 units weekly of vitamin D.  We will recheck her vitamin D levels today.  We will see if we can decrease to over-the-counter daily.  Vaginal discharge  Given that this is mentioned at the end of the visit and patient declines a vaginal exam today, will go ahead and send her prescription for Flagyl 500 mg twice daily for 7 days given that she is very familiar with bacterial vaginosis and aware of the appropriate symptoms.  If she does not have improvement, she will return to care.     Unknown Jim, DO [facility]

## 2020-07-07 ENCOUNTER — Other Ambulatory Visit: Payer: Self-pay

## 2020-07-07 ENCOUNTER — Encounter: Payer: Self-pay | Admitting: Family Medicine

## 2020-07-07 ENCOUNTER — Ambulatory Visit (INDEPENDENT_AMBULATORY_CARE_PROVIDER_SITE_OTHER): Payer: Medicaid Other | Admitting: Family Medicine

## 2020-07-07 VITALS — BP 120/70 | HR 87 | Ht 59.0 in | Wt 239.1 lb

## 2020-07-07 DIAGNOSIS — N898 Other specified noninflammatory disorders of vagina: Secondary | ICD-10-CM

## 2020-07-07 DIAGNOSIS — E559 Vitamin D deficiency, unspecified: Secondary | ICD-10-CM | POA: Diagnosis not present

## 2020-07-07 DIAGNOSIS — E119 Type 2 diabetes mellitus without complications: Secondary | ICD-10-CM | POA: Diagnosis not present

## 2020-07-07 LAB — POCT GLYCOSYLATED HEMOGLOBIN (HGB A1C): HbA1c, POC (controlled diabetic range): 6.8 % (ref 0.0–7.0)

## 2020-07-07 MED ORDER — METRONIDAZOLE 500 MG PO TABS: 500.0000 mg | ORAL_TABLET | Freq: Two times a day (BID) | ORAL | 0 refills | Status: AC

## 2020-07-07 NOTE — Assessment & Plan Note (Signed)
A1c today 6.8, which is still below her goal of 7 given her age.  She is doing significantly well.  She was congratulated on the healthy lifestyle changes that she is making.  Given that her A1c is still well controlled without Victoza in the last 3 months, will not restart this.  She can continue on Metformin 1000 mg twice daily.  We will have her come back in 3 months, if her A1c remains well controlled, can space these visits out.  Given her age, would not obtain another lipid panel at this time as she received 1 in July 2020, and would not obviously benefit from a statin.  She does not meet criteria for a/ARB therapy either.  Foot exam was performed today.  Asked that she have her eye doctor fax her diabetic eye records to [place].

## 2020-07-07 NOTE — Assessment & Plan Note (Signed)
Given that this is mentioned at the end of the visit and patient declines a vaginal exam today, will go ahead and send her prescription for Flagyl 500 mg twice daily for 7 days given that she is very familiar with bacterial vaginosis and aware of the appropriate symptoms.  If she does not have improvement, she will return to care.

## 2020-07-07 NOTE — Patient Instructions (Signed)
Thank you for coming to see me today. It was a pleasure. Today we talked about:   Your A1c was 6.8.  We will just keep you off of trulicity and continue metformin.  Please follow-up with me in 3 months.  If you have any questions or concerns, please do not hesitate to call the office at [phone].  Best,   Luis Abed, DO

## 2020-07-07 NOTE — Assessment & Plan Note (Signed)
For now, we will continue 50,000 units weekly of vitamin D.  We will recheck her vitamin D levels today.  We will see if we can decrease to over-the-counter daily.

## 2020-07-08 LAB — VITAMIN D 25 HYDROXY (VIT D DEFICIENCY, FRACTURES): Vit D, 25-Hydroxy: 27.6 ng/mL — ABNORMAL LOW (ref 30.0–100.0)

## 2020-07-30 ENCOUNTER — Telehealth: Payer: Medicaid Other | Admitting: Physician Assistant

## 2020-07-30 DIAGNOSIS — R112 Nausea with vomiting, unspecified: Secondary | ICD-10-CM

## 2020-07-30 MED ORDER — ONDANSETRON HCL 4 MG PO TABS: 4.0000 mg | ORAL_TABLET | Freq: Three times a day (TID) | ORAL | 0 refills | Status: DC | PRN

## 2020-07-30 NOTE — Progress Notes (Signed)
We are sorry that you are not feeling well. Here is how we plan to help!  Based on what you have shared with me it looks like you have a Virus that is irritating your GI tract.  Vomiting is the forceful emptying of a portion of the stomach's content through the mouth.  Although nausea and vomiting can make you feel miserable, it's important to remember that these are not diseases, but rather symptoms of an underlying illness.  When we treat short term symptoms, we always caution that any symptoms that persist should be fully evaluated in a medical office.  I have prescribed a medication that will help alleviate your symptoms and allow you to stay hydrated:  Zofran 4 mg 1 tablet every 8 hours as needed for nausea and vomiting   If you are also having a cough, headache and sore throat it may be a good idea to get tested for COVID as your symptoms could be consistent with this. You may rotate tylenol and motrin for headaches and sore throat. Make sure to drink plenty of fluids and get lots of rest.   HOME CARE:  Drink clear liquids.  This is very important! Dehydration (the lack of fluid) can lead to a serious complication.  Start off with 1 tablespoon every 5 minutes for 8 hours.  You may begin eating bland foods after 8 hours without vomiting.  Start with saltine crackers, white bread, rice, mashed potatoes, applesauce.  After 48 hours on a bland diet, you may resume a normal diet.  Try to go to sleep.  Sleep often empties the stomach and relieves the need to vomit.  GET HELP RIGHT AWAY IF:   Your symptoms do not improve or worsen within 2 days after treatment.  You have a fever for over 3 days.  You cannot keep down fluids after trying the medication.  MAKE SURE YOU:   Understand these instructions.  Will watch your condition.  Will get help right away if you are not doing well or get worse.   Thank you for choosing an e-visit. Your e-visit answers were reviewed by a board  certified advanced clinical practitioner to complete your personal care plan. Depending upon the condition, your plan could have included both over the counter or prescription medications. Please review your pharmacy choice. Be sure that the pharmacy you have chosen is open so that you can pick up your prescription now.  If there is a problem you may message your provider in MyChart to have the prescription routed to another pharmacy. Your safety is important to [place]. If you have drug allergies check your prescription carefully.  For the next 24 hours, you can use MyChart to ask questions about today's visit, request a non-urgent call back, or ask for a work or school excuse from your e-visit provider. You will get an e-mail in the next two days asking about your experience. I hope that your e-visit has been valuable and will speed your recovery.  Approximately 5 minutes was spent documenting and reviewing patient's chart.

## 2020-08-04 ENCOUNTER — Telehealth: Payer: Self-pay

## 2020-08-04 NOTE — Telephone Encounter (Signed)
Patient calls nurse line requesting to schedule appointment for GERD follow up. Patient reports burning sensation in chest. Discomfort does not radiate, denies nausea, vomiting, arm pain, and shortness of breath. Patient reports little relief with omeprazole.  Scheduled patient follow up with Dr. Constance Goltz tomorrow at 1050. Provided patient with strict ED precautions.   Veronda Prude, RN

## 2020-08-04 NOTE — Progress Notes (Signed)
    SUBJECTIVE:   CHIEF COMPLAINT / HPI:   Chest pain: pt takes omeprazole daily.  Tuesday night she had a sharp pain in her chest.  She had spicy grilled chicken, mac and cheese, and green beans, and soda for dinner. Pain comes and goes but has been there every day..  Located on the Left upper side of her chest.  Her gerd usually feels like a fire in the center of her chest.  This pain is more like a sharp pain . Pain has not improved since Tuesday.  She did not injure it, fall, or do any upper body lifting recently. No diaphoresis, SOB, radiation along neck or arm.   PERTINENT  PMH / PSH: GERD  OBJECTIVE:   BP 134/78   Pulse 90   Ht _0  (1.499 m)   Wt 236 lb 6.4 oz (107.2 kg)   SpO2 98%   BMI 47.75 kg/m   Gen: young female, appears stated age. No acute distress.  CV: RRR. No murmurs.  TTP over the left side of the chest just below the clavicle.  Pulm: LCTAB. No wheezes.   Abd: soft, nontender.  Normal bowel sounds.   ASSESSMENT/PLAN:   Chest wall pain Reproducible pain on palpation 2 inches below the Left clavicle. Most likely costochondritis or similar chest wall pain.  Does not appear to be cardiac in nature.  Possibly GERD, although this is different than her gerd symptoms according to pt. Unlikely to be PE given normal vitals and otherwise normal physical exam. Advised pt to use heat, nsaid/tylenol, voltaren gel.  Informed pt this may take up to 4 weeks to resolve.       Benay Pike, MD [place]

## 2020-08-05 ENCOUNTER — Other Ambulatory Visit: Payer: Self-pay

## 2020-08-05 ENCOUNTER — Encounter: Payer: Self-pay | Admitting: Family Medicine

## 2020-08-05 ENCOUNTER — Ambulatory Visit (INDEPENDENT_AMBULATORY_CARE_PROVIDER_SITE_OTHER): Payer: Medicaid Other | Admitting: Family Medicine

## 2020-08-05 VITALS — BP 134/78 | HR 90 | Ht 59.0 in | Wt 236.4 lb

## 2020-08-05 DIAGNOSIS — R0789 Other chest pain: Secondary | ICD-10-CM | POA: Diagnosis not present

## 2020-08-05 MED ORDER — DICLOFENAC SODIUM 1 % EX GEL: 2.0000 g | Freq: Four times a day (QID) | CUTANEOUS | 0 refills | Status: AC

## 2020-08-05 NOTE — Patient Instructions (Addendum)
It was nice to meet you today,  You have something called costochondritis or musculoskeletal chest pain.  These things can be due to things such as coughing, exercise, or even sleeping the wrong way.  This will get better in time.  Things you can do in the meantime to improve this are: -Take Tylenol as needed for pain -Use Voltaren gel on the area up to 4 times a day -Put heat on the area in the form of heating pad or warm compress.  If you develop any difficulty breathing at any time, or if the chest pain continues to get worse, please see a medical professional.  Have a great day,  Lenor Coffin, MD

## 2020-08-05 NOTE — Assessment & Plan Note (Signed)
Reproducible pain on palpation 2 inches below the Left clavicle. Most likely costochondritis or similar chest wall pain.  Does not appear to be cardiac in nature.  Possibly GERD, although this is different than her gerd symptoms according to pt. Unlikely to be PE given normal vitals and otherwise normal physical exam. Advised pt to use heat, nsaid/tylenol, voltaren gel.  Informed pt this may take up to 4 weeks to resolve.

## 2020-08-20 ENCOUNTER — Ambulatory Visit (INDEPENDENT_AMBULATORY_CARE_PROVIDER_SITE_OTHER): Payer: Medicaid Other | Admitting: Family Medicine

## 2020-08-20 ENCOUNTER — Encounter: Payer: Self-pay | Admitting: Family Medicine

## 2020-08-20 ENCOUNTER — Other Ambulatory Visit: Payer: Self-pay

## 2020-08-20 VITALS — BP 136/62 | HR 99 | Wt 235.0 lb

## 2020-08-20 DIAGNOSIS — Z3009 Encounter for other general counseling and advice on contraception: Secondary | ICD-10-CM | POA: Diagnosis not present

## 2020-08-20 DIAGNOSIS — Z309 Encounter for contraceptive management, unspecified: Secondary | ICD-10-CM | POA: Insufficient documentation

## 2020-08-20 LAB — POCT URINE PREGNANCY: Preg Test, Ur: NEGATIVE

## 2020-08-20 NOTE — Patient Instructions (Signed)
Birth control: Thank you for putting up with a discussion of different birth control options.  I would be happy to refer you to a gynecologist for you to discuss a tubal ligation or other forms of permanent sterilization.  You should get a call in the next 1-2 weeks to set up an appointment with a gynecologist.

## 2020-08-20 NOTE — Progress Notes (Signed)
    SUBJECTIVE:   CHIEF COMPLAINT / HPI:   Undesired fertility Sandra [name] currently uses a birth control patch for birth control.  She has 2 children and is in a committed relationship with her significant other and is engaged to be married.  At this time in her life, she is decided that she and her partner would not be planning for any additional children in the future.  She would like to discuss options for permanent, irreversible birth control.  PERTINENT  PMH / PSH: Diabetes, obesity  OBJECTIVE:   BP 136/62   Pulse 99   Wt 235 lb (106.6 kg)   LMP 08/19/2020   SpO2 98%   BMI 47.46 kg/m    General: Seated comfortably in her chair in the exam room.  No acute distress. Respiratory: Breathing comfortably on room air.  No respiratory distress. Skin: Warm, dry.  No evidence of rashes on visible skin. Neuro: Cranial nerves grossly intact  ASSESSMENT/PLAN:   Contraception management After discussion about the myriad available methods of birth control, she reported that she is really only interested in permanent sterilization at this time.  We will plan to continue the patch for now and refer her to gynecology. -Placed referral to gynecology.     Sandra Haymaker, MD [place]

## 2020-08-20 NOTE — Assessment & Plan Note (Signed)
After discussion about the myriad available methods of birth control, she reported that she is really only interested in permanent sterilization at this time.  We will plan to continue the patch for now and refer her to gynecology. -Placed referral to gynecology.

## 2020-09-01 ENCOUNTER — Other Ambulatory Visit: Payer: Self-pay | Admitting: Family Medicine

## 2020-09-01 ENCOUNTER — Telehealth: Payer: Medicaid Other | Admitting: Nurse Practitioner

## 2020-09-01 DIAGNOSIS — K219 Gastro-esophageal reflux disease without esophagitis: Secondary | ICD-10-CM

## 2020-09-01 DIAGNOSIS — B373 Candidiasis of vulva and vagina: Secondary | ICD-10-CM | POA: Diagnosis not present

## 2020-09-01 DIAGNOSIS — B3731 Acute candidiasis of vulva and vagina: Secondary | ICD-10-CM

## 2020-09-01 MED ORDER — FLUCONAZOLE 150 MG PO TABS: 150.0000 mg | ORAL_TABLET | Freq: Once | ORAL | 0 refills | Status: AC

## 2020-09-01 NOTE — Progress Notes (Signed)

## 2020-09-03 ENCOUNTER — Other Ambulatory Visit: Payer: Self-pay

## 2020-09-06 MED ORDER — SERTRALINE HCL 25 MG PO TABS: 25.0000 mg | ORAL_TABLET | Freq: Every day | ORAL | 3 refills | Status: AC

## 2020-10-11 ENCOUNTER — Telehealth: Payer: Medicaid Other | Admitting: Emergency Medicine

## 2020-10-11 DIAGNOSIS — N760 Acute vaginitis: Secondary | ICD-10-CM

## 2020-10-11 MED ORDER — FLUCONAZOLE 150 MG PO TABS: 150.0000 mg | ORAL_TABLET | Freq: Once | ORAL | 0 refills | Status: AC

## 2020-10-11 NOTE — Progress Notes (Signed)
We are sorry that you are not feeling well. Here is how we plan to help! Based on what you shared with me it looks like you: May have a yeast vaginosis  Vaginosis is an inflammation of the vagina that can result in discharge, itching and pain. The cause is usually a change in the normal balance of vaginal bacteria or an infection. Vaginosis can also result from reduced estrogen levels after menopause.  The most common causes of vaginosis are:   Bacterial vaginosis which results from an overgrowth of one on several organisms that are normally present in your vagina.   Yeast infections which are caused by a naturally occurring fungus called candida.   Vaginal atrophy (atrophic vaginosis) which results from the thinning of the vagina from reduced estrogen levels after menopause.   Trichomoniasis which is caused by a parasite and is commonly transmitted by sexual intercourse.  Factors that increase your risk of developing vaginosis include: . Medications, such as antibiotics and steroids . Uncontrolled diabetes . Use of hygiene products such as bubble bath, vaginal spray or vaginal deodorant . Douching . Wearing damp or tight-fitting clothing . Using an intrauterine device (IUD) for birth control . Hormonal changes, such as those associated with pregnancy, birth control pills or menopause . Sexual activity . Having a sexually transmitted infection  Your treatment plan is A single Diflucan (fluconazole) 150mg tablet once.  I have electronically sent this prescription into the pharmacy that you have chosen.  Be sure to take all of the medication as directed. Stop taking any medication if you develop a rash, tongue swelling or shortness of breath. Mothers who are breast feeding should consider pumping and discarding their breast milk while on these antibiotics. However, there is no consensus that infant exposure at these doses would be harmful.  Remember that medication creams can weaken latex  condoms. .   HOME CARE:  Good hygiene may prevent some types of vaginosis from recurring and may relieve some symptoms:  . Avoid baths, hot tubs and whirlpool spas. Rinse soap from your outer genital area after a shower, and dry the area well to prevent irritation. Don't use scented or harsh soaps, such as those with deodorant or antibacterial action. . Avoid irritants. These include scented tampons and pads. . Wipe from front to back after using the toilet. Doing so avoids spreading fecal bacteria to your vagina.  Other things that may help prevent vaginosis include:  . Don't douche. Your vagina doesn't require cleansing other than normal bathing. Repetitive douching disrupts the normal organisms that reside in the vagina and can actually increase your risk of vaginal infection. Douching won't clear up a vaginal infection. . Use a latex condom. Both female and female latex condoms may help you avoid infections spread by sexual contact. . Wear cotton underwear. Also wear pantyhose with a cotton crotch. If you feel comfortable without it, skip wearing underwear to bed. Yeast thrives in moist environments Your symptoms should improve in the next day or two.  GET HELP RIGHT AWAY IF:  . You have pain in your lower abdomen ( pelvic area or over your ovaries) . You develop nausea or vomiting . You develop a fever . Your discharge changes or worsens . You have persistent pain with intercourse . You develop shortness of breath, a rapid pulse, or you faint.  These symptoms could be signs of problems or infections that need to be evaluated by a medical provider now.  MAKE SURE YOU      Understand these instructions.  Will watch your condition.  Will get help right away if you are not doing well or get worse.  Your e-visit answers were reviewed by a board certified advanced clinical practitioner to complete your personal care plan. Depending upon the condition, your plan could have included  both over the counter or prescription medications. Please review your pharmacy choice to make sure that you have choses a pharmacy that is open for you to pick up any needed prescription, Your safety is important to us. If you have drug allergies check your prescription carefully.   You can use MyChart to ask questions about today's visit, request a non-urgent call back, or ask for a work or school excuse for 24 hours related to this e-Visit. If it has been greater than 24 hours you will need to follow up with your provider, or enter a new e-Visit to address those concerns. You will get a MyChart message within the next two days asking about your experience. I hope that your e-visit has been valuable and will speed your recovery.  Approximately 5 minutes was used in reviewing the patient's chart, questionnaire, prescribing medications, and documentation.  

## 2020-10-27 ENCOUNTER — Ambulatory Visit: Payer: Medicaid Other | Admitting: Advanced Practice Midwife

## 2020-11-28 ENCOUNTER — Other Ambulatory Visit: Payer: Self-pay | Admitting: Family Medicine

## 2020-11-28 DIAGNOSIS — K219 Gastro-esophageal reflux disease without esophagitis: Secondary | ICD-10-CM

## 2020-12-20 ENCOUNTER — Other Ambulatory Visit: Payer: Self-pay | Admitting: Family Medicine

## 2021-07-13 ENCOUNTER — Emergency Department (HOSPITAL_BASED_OUTPATIENT_CLINIC_OR_DEPARTMENT_OTHER)
Admission: EM | Admit: 2021-07-13 | Discharge: 2021-07-13 | Disposition: A | Discharge status: Home / Self Care | Payer: Medicaid Other | Attending: Emergency Medicine | Admitting: Emergency Medicine

## 2021-07-13 ENCOUNTER — Other Ambulatory Visit: Payer: Self-pay

## 2021-07-13 ENCOUNTER — Encounter (HOSPITAL_BASED_OUTPATIENT_CLINIC_OR_DEPARTMENT_OTHER): Payer: Self-pay | Admitting: Emergency Medicine

## 2021-07-13 DIAGNOSIS — Z7984 Long term (current) use of oral hypoglycemic drugs: Secondary | ICD-10-CM | POA: Insufficient documentation

## 2021-07-13 DIAGNOSIS — R509 Fever, unspecified: Secondary | ICD-10-CM | POA: Insufficient documentation

## 2021-07-13 DIAGNOSIS — M791 Myalgia, unspecified site: Secondary | ICD-10-CM | POA: Diagnosis not present

## 2021-07-13 DIAGNOSIS — R519 Headache, unspecified: Secondary | ICD-10-CM | POA: Insufficient documentation

## 2021-07-13 DIAGNOSIS — E119 Type 2 diabetes mellitus without complications: Secondary | ICD-10-CM | POA: Diagnosis not present

## 2021-07-13 DIAGNOSIS — Z87891 Personal history of nicotine dependence: Secondary | ICD-10-CM | POA: Insufficient documentation

## 2021-07-13 DIAGNOSIS — J111 Influenza due to unidentified influenza virus with other respiratory manifestations: Secondary | ICD-10-CM

## 2021-07-13 DIAGNOSIS — Z794 Long term (current) use of insulin: Secondary | ICD-10-CM | POA: Insufficient documentation

## 2021-07-13 DIAGNOSIS — R059 Cough, unspecified: Secondary | ICD-10-CM | POA: Insufficient documentation

## 2021-07-13 MED ORDER — OSELTAMIVIR PHOSPHATE 75 MG PO CAPS: 75.0000 mg | ORAL_CAPSULE | Freq: Two times a day (BID) | ORAL | 0 refills | Status: DC

## 2021-07-13 MED ORDER — BENZONATATE 100 MG PO CAPS: 100.0000 mg | ORAL_CAPSULE | Freq: Three times a day (TID) | ORAL | 0 refills | Status: AC

## 2021-07-13 NOTE — Discharge Instructions (Addendum)
You may take over-the-counter medicine for symptomatic relief, such as Tylenol, Motrin, TheraFlu, Alka seltzer , black elderberry, etc. Please limit acetaminophen (Tylenol) to 4000 mg and Ibuprofen (Motrin, Advil, etc.) to 2400 mg for a 24hr period. Please note that other over-the-counter medicine may contain acetaminophen or ibuprofen as a component of their ingredients.   

## 2021-07-13 NOTE — ED Provider Notes (Signed)
[facility] EMERGENCY DEPARTMENT Provider Note  CSN: [id number] Arrival date & time: 07/13/21 0347  Chief Complaint(s) Cough and Fever  HPI Sandra [name] is a 30 y.o. female   The history is provided by the patient.  Influenza Presenting symptoms: cough, fever, headache and myalgias   Presenting symptoms: no nausea and no vomiting   Severity:  Moderate Onset quality:  Gradual Duration:  3 days Progression:  Waxing and waning Chronicity:  New Relieved by:  OTC medications Worsened by:  Nothing Associated symptoms: chills   Associated symptoms: no decreased appetite   Risk factors: sick contacts (niece, who lives in the same house, tested + for influenza)    Past Medical History Past Medical History:  Diagnosis Date   Anemia    Diabetes mellitus without complication ([place])    GERD (gastroesophageal reflux disease)    Hemorrhage after delivery of fetus    Intraoperative, received blood transfusion   History of blood transfusion    Hx of ectopic pregnancy    Patient Active Problem List   Diagnosis Date Noted   Contraception management 08/20/2020   Chest wall pain 08/05/2020   Vitamin D deficiency 07/07/2020   Fatigue 05/26/2020   Major depression, recurrent ([place]) 08/02/2019   Diabetes mellitus without complication, without long-term current use of insulin ([place]) 05/26/2016   Anxiety state 04/28/2016   GERD (gastroesophageal reflux disease) 04/28/2016   Morbid obesity ([place]) 01/13/2014   Panic attacks 01/13/2014   Home Medication(s) Prior to Admission medications   Medication Sig Start Date End Date Taking? Authorizing Provider  oseltamivir (TAMIFLU) 75 MG capsule Take 1 capsule (75 mg total) by mouth every 12 (twelve) hours. 07/13/21  Yes Tanara Turvey, Grayce Sessions, MD  albuterol (VENTOLIN HFA) 108 (90 Base) MCG/ACT inhaler Inhale 1-2 puffs into the lungs every 6 (six) hours as needed for wheezing or shortness of breath. Patient not taking: Reported on  07/31/2019 06/17/19   Madilyn Hook A, PA-C  diclofenac Sodium (VOLTAREN) 1 % GEL Apply 2 g topically 4 (four) times daily. 08/05/20   Benay Pike, MD  ferrous sulfate 325 (65 FE) MG tablet Take 1 tablet (325 mg total) by mouth every other day. Patient not taking: Reported on 07/31/2019 06/28/18   Glenis Smoker, MD  glucose blood (ACCU-CHEK GUIDE) test strip Use to check blood sugar once daily or as instructed. 09/19/18   Leeanne Rio, MD  glucose monitoring kit (FREESTYLE) monitoring kit 1 each by Does not apply route as needed for other. 04/11/19   Meccariello, Bernita Raisin, DO  hydrOXYzine (ATARAX/VISTARIL) 25 MG tablet Take 1 tablet (25 mg total) by mouth 3 (three) times daily as needed for anxiety. 08/05/19   Starkes-Perry, Gayland Curry, FNP  Insulin Pen Needle 32G X 4 MM MISC 1 Piece by Does not apply route daily. 04/15/19   Meccariello, Bernita Raisin, DO  Lancets Misc. (ACCU-CHEK FASTCLIX LANCET) KIT 1 Piece by Does not apply route 2 (two) times daily. 04/14/19   Meccariello, Bernita Raisin, DO  metFORMIN (GLUCOPHAGE) 1000 MG tablet TAKE 1 TABLET (1,000 MG TOTAL) BY MOUTH 2 (TWO) TIMES DAILY WITH A MEAL. 12/20/20   Meccariello, Bernita Raisin, DO  norelgestromin-ethinyl estradiol (ORTHO EVRA) 150-35 MCG/24HR transdermal patch Place 1 patch onto the skin once a week. 05/26/20   Meccariello, Bernita Raisin, DO  omeprazole (PRILOSEC) 40 MG capsule TAKE 1 CAPSULE BY MOUTH EVERY DAY 11/29/20   Meccariello, Bernita Raisin, DO  ondansetron (ZOFRAN) 4 MG tablet Take 1 tablet (4 mg  total) by mouth every 8 (eight) hours as needed for up to 20 doses for nausea or vomiting. 07/30/20   Couture, Cortni S, PA-C  sertraline (ZOLOFT) 25 MG tablet Take 1 tablet (25 mg total) by mouth daily. 09/06/20   Meccariello, Bernita Raisin, DO  traZODone (DESYREL) 50 MG tablet Take 1 tablet (50 mg total) by mouth at bedtime as needed for sleep. 08/05/19   Starkes-Perry, Gayland Curry, FNP  vitamin B-12 (CYANOCOBALAMIN) 500 MCG tablet Take 1 tablet (500 mcg total)  by mouth daily. 09/04/19   Meccariello, Bernita Raisin, DO  Vitamin D, Ergocalciferol, (DRISDOL) 1.25 MG (50000 UNIT) CAPS capsule Take 1 capsule (50,000 Units total) by mouth every 7 (seven) days. 05/27/20   Meccariello, Bernita Raisin, DO                                                                                                                                    Past Surgical History Past Surgical History:  Procedure Laterality Date   CESAREAN SECTION     CESAREAN SECTION  04/23/2012   Procedure: CESAREAN SECTION;  Surgeon: Donnamae Jude, MD;  Location: [facility];  Service: Gynecology;  Laterality: N/A;   CHOLECYSTECTOMY  10/23/2012   Procedure: LAPAROSCOPIC CHOLECYSTECTOMY WITH INTRAOPERATIVE CHOLANGIOGRAM;  Surgeon: Jametta Moorehead Earls, MD;  Location: [facility];  Service: General;  Laterality: N/A;   LAPAROSCOPY FOR ECTOPIC PREGNANCY     Family History Family History  Problem Relation Age of Onset   High blood pressure Mother    Aneurysm Mother    Hypertension Mother    Drug abuse Mother    Diabetes Mellitus II Father    Hyperlipidemia Father    Alcohol abuse Father    Heart disease Maternal Aunt    Diabetes Paternal Grandmother    Diabetes Paternal Grandfather    Other Neg Hx     Social History Social History   Tobacco Use   Smoking status: Former   Smokeless tobacco: Never  Scientific laboratory technician Use: Never used  Substance Use Topics   Alcohol use: Not Currently    Comment: occasionally   Drug use: No   Allergies Shellfish allergy  Review of Systems Review of Systems  Constitutional:  Positive for chills and fever. Negative for decreased appetite.  Respiratory:  Positive for cough.   Gastrointestinal:  Negative for nausea and vomiting.  Musculoskeletal:  Positive for myalgias.  Neurological:  Positive for headaches.  All other systems are reviewed and are negative for acute change except as noted in the HPI  Physical Exam Vital Signs  I have reviewed the triage vital signs BP  (!) 157/85 (BP Location: Left Arm)   Pulse (!) 104   Temp (!) 100.9 F (38.3 C) (Oral)   Resp 20   Ht '4\' 11"'  (1.499 m)   Wt 111.6 kg   LMP 06/14/2021 (Exact Date)   SpO2 98%   BMI  49.69 kg/m   Physical Exam Vitals reviewed.  Constitutional:      General: She is not in acute distress.    Appearance: She is well-developed. She is not diaphoretic.  HENT:     Head: Normocephalic and atraumatic.     Right Ear: Tympanic membrane normal.     Left Ear: Tympanic membrane normal.     Nose: Nose normal.  Eyes:     General: No scleral icterus.       Right eye: No discharge.        Left eye: No discharge.     Conjunctiva/sclera: Conjunctivae normal.     Pupils: Pupils are equal, round, and reactive to light.  Cardiovascular:     Rate and Rhythm: Normal rate and regular rhythm.     Heart sounds: No murmur heard.   No friction rub. No gallop.  Pulmonary:     Effort: Pulmonary effort is normal. No respiratory distress.     Breath sounds: Normal breath sounds. No stridor. No rales.  Abdominal:     General: There is no distension.     Palpations: Abdomen is soft.     Tenderness: There is no abdominal tenderness.  Musculoskeletal:        General: No tenderness.     Cervical back: Normal range of motion and neck supple.  Skin:    General: Skin is warm and dry.     Findings: No erythema or rash.  Neurological:     Mental Status: She is alert and oriented to person, place, and time.    ED Results and Treatments Labs (all labs ordered are listed, but only abnormal results are displayed) Labs Reviewed - No data to display                                                                                                                       EKG  EKG Interpretation  Date/Time:    Ventricular Rate:    PR Interval:    QRS Duration:   QT Interval:    QTC Calculation:   R Axis:     Text Interpretation:         Radiology No results found.  Pertinent labs & imaging results that  were available during my care of the patient were reviewed by me and considered in my medical decision making (see MDM for details).  Medications Ordered in ED Medications - No data to display  Procedures Procedures  (including critical care time)  Medical Decision Making / ED Course I have reviewed the nursing notes for this encounter and the patient's prior records (if available in EHR or on provided paperwork).  Sandra [name] was evaluated in Emergency Department on 07/13/2021 for the symptoms described in the history of present illness. She was evaluated in the context of the global COVID-19 pandemic, which necessitated consideration that the patient might be at risk for infection with the SARS-CoV-2 virus that causes COVID-19. Institutional protocols and algorithms that pertain to the evaluation of patients at risk for COVID-19 are in a state of rapid change based on information released by regulatory bodies including the CDC and federal and state organizations. These policies and algorithms were followed during the patient's care in the ED.     30 y.o. female presents with flu-like symptoms for 3 days. Adequate oral hydration. Rest of history as above.  Patient appears well. No signs of toxicity, patient is interactive. No hypoxia, tachypnea or other signs of respiratory distress. No sign of clinical dehydration. Lung exam clear. Rest of exam as above.  Most consistent with flu-like illness   No evidence suggestive of pharyngitis, AOM, PNA, or meningitis.  Chest x-ray not indicated at this time.  She is a candidate for Tamiflu.  Discussed symptomatic treatment with the patient and they will follow closely with their PCP.   Pertinent labs & imaging results that were available during my care of the patient were reviewed by me and considered in my medical  decision making:    Final Clinical Impression(s) / ED Diagnoses Final diagnoses:  Influenza-like illness   The patient appears reasonably screened and/or stabilized for discharge and I doubt any other medical condition or other [facility] requiring further screening, evaluation, or treatment in the ED at this time prior to discharge. Safe for discharge with strict return precautions.  Disposition: Discharge  Condition: Good  I have discussed the results, Dx and Tx plan with the patient/family who expressed understanding and agree(s) with the plan. Discharge instructions discussed at length. The patient/family was given strict return precautions who verbalized understanding of the instructions. No further questions at time of discharge.    ED Discharge Orders          Ordered    oseltamivir (TAMIFLU) 75 MG capsule  Every 12 hours        07/13/21 0438             Follow Up: Primary care provider  Call  to schedule an appointment for close follow up    This chart was dictated using voice recognition software.  Despite best efforts to proofread,  errors can occur which can change the documentation meaning.    Fatima Blank, MD 07/13/21 [phone]

## 2021-07-13 NOTE — ED Triage Notes (Signed)
Pt is c/o flu like symptoms that include fever, cough, shortness of breath, and vomiting x 3 days

## 2021-07-13 NOTE — ED Notes (Signed)
Patient took tylenol last at 2200.

## 2021-08-01 ENCOUNTER — Telehealth: Payer: Medicaid Other | Admitting: Physician Assistant

## 2021-08-01 DIAGNOSIS — N760 Acute vaginitis: Secondary | ICD-10-CM

## 2021-08-01 DIAGNOSIS — B9689 Other specified bacterial agents as the cause of diseases classified elsewhere: Secondary | ICD-10-CM

## 2021-08-01 DIAGNOSIS — T50905A Adverse effect of unspecified drugs, medicaments and biological substances, initial encounter: Secondary | ICD-10-CM | POA: Diagnosis not present

## 2021-08-01 MED ORDER — METRONIDAZOLE 500 MG PO TABS: 500.0000 mg | ORAL_TABLET | Freq: Two times a day (BID) | ORAL | 0 refills | Status: AC

## 2021-08-01 NOTE — Progress Notes (Signed)
Virtual Visit Consent   Sandra [name], you are scheduled for a virtual visit with a [facility] provider today.     Just as with appointments in the office, your consent must be obtained to participate.  Your consent will be active for this visit and any virtual visit you may have with one of our providers in the next 365 days.     If you have a MyChart account, a copy of this consent can be sent to you electronically.  All virtual visits are billed to your insurance company just like a traditional visit in the office.    As this is a virtual visit, video technology does not allow for your provider to perform a traditional examination.  This may limit your provider's ability to fully assess your condition.  If your provider identifies any concerns that need to be evaluated in person or the need to arrange testing (such as labs, EKG, etc.), we will make arrangements to do so.     Although advances in technology are sophisticated, we cannot ensure that it will always work on either your end or our end.  If the connection with a video visit is poor, the visit may have to be switched to a telephone visit.  With either a video or telephone visit, we are not always able to ensure that we have a secure connection.     I need to obtain your verbal consent now.   Are you willing to proceed with your visit today?    Ambera Fedele has provided verbal consent on 08/01/2021 for a virtual visit (video or telephone).   Mar Daring, PA-C   Date: 08/01/2021 6:23 PM   Virtual Visit via Video Note   I, Mar Daring, connected with  Sandra [name]  ([id number], 1990-09-22) on 08/01/21 at  6:00 PM EST by a video-enabled telemedicine application and verified that I am speaking with the correct person using two identifiers.  Location: Patient: Virtual Visit Location Patient: Home Provider: Virtual Visit Location Provider: Home Office   I discussed the limitations of evaluation and management  by telemedicine and the availability of in person appointments. The patient expressed understanding and agreed to proceed.    History of Present Illness: Sandra [name] is a 30 y.o. who identifies as a female who was assigned female at birth, and is being seen today for needing a note for work for bathroom accommodations. She is on Metformin and this causes her to have frequent, unexpected BM. Also having a foul-smelling vaginal discharge. Has had BV before and feels this is similar.    Problems:  Patient Active Problem List   Diagnosis Date Noted   Contraception management 08/20/2020   Chest wall pain 08/05/2020   Vitamin D deficiency 07/07/2020   Fatigue 05/26/2020   Major depression, recurrent ([place]) 08/02/2019   Diabetes mellitus without complication, without [name]-term current use of insulin ([place]) 05/26/2016   Anxiety state 04/28/2016   GERD (gastroesophageal reflux disease) 04/28/2016   Morbid obesity ([place]) 01/13/2014   Panic attacks 01/13/2014    Allergies:  Allergies  Allergen Reactions   Shellfish Allergy Anaphylaxis   Medications:  Current Outpatient Medications:    metroNIDAZOLE (FLAGYL) 500 MG tablet, Take 1 tablet (500 mg total) by mouth 2 (two) times daily for 7 days., Disp: 14 tablet, Rfl: 0   albuterol (VENTOLIN HFA) 108 (90 Base) MCG/ACT inhaler, Inhale 1-2 puffs into the lungs every 6 (six) hours as needed for wheezing or shortness of breath. (  Patient not taking: Reported on 07/31/2019), Disp: 6.7 g, Rfl: 0   benzonatate (TESSALON) 100 MG capsule, Take 1 capsule (100 mg total) by mouth every 8 (eight) hours., Disp: 21 capsule, Rfl: 0   diclofenac Sodium (VOLTAREN) 1 % GEL, Apply 2 g topically 4 (four) times daily., Disp: 150 g, Rfl: 0   ferrous sulfate 325 (65 FE) MG tablet, Take 1 tablet (325 mg total) by mouth every other day. (Patient not taking: Reported on 07/31/2019), Disp: 90 tablet, Rfl: 3   glucose blood (ACCU-CHEK GUIDE) test strip, Use to check blood sugar  once daily or as instructed., Disp: 50 each, Rfl: 12   glucose monitoring kit (FREESTYLE) monitoring kit, 1 each by Does not apply route as needed for other., Disp: 1 each, Rfl: 0   hydrOXYzine (ATARAX/VISTARIL) 25 MG tablet, Take 1 tablet (25 mg total) by mouth 3 (three) times daily as needed for anxiety., Disp: 30 tablet, Rfl: 0   Insulin Pen Needle 32G X 4 MM MISC, 1 Piece by Does not apply route daily., Disp: 30 each, Rfl: 3   Lancets Misc. (ACCU-CHEK FASTCLIX LANCET) KIT, 1 Piece by Does not apply route 2 (two) times daily., Disp: 1 kit, Rfl: 1   metFORMIN (GLUCOPHAGE) 1000 MG tablet, TAKE 1 TABLET (1,000 MG TOTAL) BY MOUTH 2 (TWO) TIMES DAILY WITH A MEAL., Disp: 180 tablet, Rfl: 1   norelgestromin-ethinyl estradiol (ORTHO EVRA) 150-35 MCG/24HR transdermal patch, Place 1 patch onto the skin once a week., Disp: 3 patch, Rfl: 12   omeprazole (PRILOSEC) 40 MG capsule, TAKE 1 CAPSULE BY MOUTH EVERY DAY, Disp: 90 capsule, Rfl: 0   ondansetron (ZOFRAN) 4 MG tablet, Take 1 tablet (4 mg total) by mouth every 8 (eight) hours as needed for up to 20 doses for nausea or vomiting., Disp: 20 tablet, Rfl: 0   oseltamivir (TAMIFLU) 75 MG capsule, Take 1 capsule (75 mg total) by mouth every 12 (twelve) hours., Disp: 10 capsule, Rfl: 0   sertraline (ZOLOFT) 25 MG tablet, Take 1 tablet (25 mg total) by mouth daily., Disp: 90 tablet, Rfl: 3   traZODone (DESYREL) 50 MG tablet, Take 1 tablet (50 mg total) by mouth at bedtime as needed for sleep., Disp: 30 tablet, Rfl: 0   vitamin B-12 (CYANOCOBALAMIN) 500 MCG tablet, Take 1 tablet (500 mcg total) by mouth daily., Disp: 90 tablet, Rfl: 3   Vitamin D, Ergocalciferol, (DRISDOL) 1.25 MG (50000 UNIT) CAPS capsule, Take 1 capsule (50,000 Units total) by mouth every 7 (seven) days., Disp: 6 capsule, Rfl: 0  Observations/Objective: Patient is well-developed, well-nourished in no acute distress.  Resting comfortably at home.  Head is normocephalic, atraumatic.  No labored  breathing.  Speech is clear and coherent with logical content.  Patient is alert and oriented at baseline.    Assessment and Plan: 1. BV (bacterial vaginosis) - metroNIDAZOLE (FLAGYL) 500 MG tablet; Take 1 tablet (500 mg total) by mouth 2 (two) times daily for 7 days.  Dispense: 14 tablet; Refill: 0  2. Medication side effect, initial encounter  - Metronidazole for BV - List of vaginal probiotics sent via AVS - Letter sent for one week of bathroom accommodation to give time for her to set up appt with her PCP. Advised we do not complete any FMLA, ADA or work place accommodation paperwork and this would need to be completed by the prescriber of Metformin, voiced understanding.  Follow Up Instructions: I discussed the assessment and treatment plan with the patient. The patient  was provided an opportunity to ask questions and all were answered. The patient agreed with the plan and demonstrated an understanding of the instructions.  A copy of instructions were sent to the patient via MyChart unless otherwise noted below.    The patient was advised to call back or seek an in-person evaluation if the symptoms worsen or if the condition fails to improve as anticipated.  Time:  I spent 15 minutes with the patient via telehealth technology discussing the above problems/concerns.    Mar Daring, PA-C

## 2021-08-01 NOTE — Patient Instructions (Signed)
Sandra [name], thank you for joining Mar Daring, PA-C for today's virtual visit.  While this provider is not your primary care provider (PCP), if your PCP is located in our provider database this encounter information will be shared with them immediately following your visit.  Consent: (Patient) Sandra [name] provided verbal consent for this virtual visit at the beginning of the encounter.  Current Medications:  Current Outpatient Medications:    metroNIDAZOLE (FLAGYL) 500 MG tablet, Take 1 tablet (500 mg total) by mouth 2 (two) times daily for 7 days., Disp: 14 tablet, Rfl: 0   albuterol (VENTOLIN HFA) 108 (90 Base) MCG/ACT inhaler, Inhale 1-2 puffs into the lungs every 6 (six) hours as needed for wheezing or shortness of breath. (Patient not taking: Reported on 07/31/2019), Disp: 6.7 g, Rfl: 0   benzonatate (TESSALON) 100 MG capsule, Take 1 capsule (100 mg total) by mouth every 8 (eight) hours., Disp: 21 capsule, Rfl: 0   diclofenac Sodium (VOLTAREN) 1 % GEL, Apply 2 g topically 4 (four) times daily., Disp: 150 g, Rfl: 0   ferrous sulfate 325 (65 FE) MG tablet, Take 1 tablet (325 mg total) by mouth every other day. (Patient not taking: Reported on 07/31/2019), Disp: 90 tablet, Rfl: 3   glucose blood (ACCU-CHEK GUIDE) test strip, Use to check blood sugar once daily or as instructed., Disp: 50 each, Rfl: 12   glucose monitoring kit (FREESTYLE) monitoring kit, 1 each by Does not apply route as needed for other., Disp: 1 each, Rfl: 0   hydrOXYzine (ATARAX/VISTARIL) 25 MG tablet, Take 1 tablet (25 mg total) by mouth 3 (three) times daily as needed for anxiety., Disp: 30 tablet, Rfl: 0   Insulin Pen Needle 32G X 4 MM MISC, 1 Piece by Does not apply route daily., Disp: 30 each, Rfl: 3   Lancets Misc. (ACCU-CHEK FASTCLIX LANCET) KIT, 1 Piece by Does not apply route 2 (two) times daily., Disp: 1 kit, Rfl: 1   metFORMIN (GLUCOPHAGE) 1000 MG tablet, TAKE 1 TABLET (1,000 MG TOTAL) BY MOUTH 2  (TWO) TIMES DAILY WITH A MEAL., Disp: 180 tablet, Rfl: 1   norelgestromin-ethinyl estradiol (ORTHO EVRA) 150-35 MCG/24HR transdermal patch, Place 1 patch onto the skin once a week., Disp: 3 patch, Rfl: 12   omeprazole (PRILOSEC) 40 MG capsule, TAKE 1 CAPSULE BY MOUTH EVERY DAY, Disp: 90 capsule, Rfl: 0   ondansetron (ZOFRAN) 4 MG tablet, Take 1 tablet (4 mg total) by mouth every 8 (eight) hours as needed for up to 20 doses for nausea or vomiting., Disp: 20 tablet, Rfl: 0   oseltamivir (TAMIFLU) 75 MG capsule, Take 1 capsule (75 mg total) by mouth every 12 (twelve) hours., Disp: 10 capsule, Rfl: 0   sertraline (ZOLOFT) 25 MG tablet, Take 1 tablet (25 mg total) by mouth daily., Disp: 90 tablet, Rfl: 3   traZODone (DESYREL) 50 MG tablet, Take 1 tablet (50 mg total) by mouth at bedtime as needed for sleep., Disp: 30 tablet, Rfl: 0   vitamin B-12 (CYANOCOBALAMIN) 500 MCG tablet, Take 1 tablet (500 mcg total) by mouth daily., Disp: 90 tablet, Rfl: 3   Vitamin D, Ergocalciferol, (DRISDOL) 1.25 MG (50000 UNIT) CAPS capsule, Take 1 capsule (50,000 Units total) by mouth every 7 (seven) days., Disp: 6 capsule, Rfl: 0   Medications ordered in this encounter:  Meds ordered this encounter  Medications   metroNIDAZOLE (FLAGYL) 500 MG tablet    Sig: Take 1 tablet (500 mg total) by mouth 2 (two) times  daily for 7 days.    Dispense:  14 tablet    Refill:  0    Order Specific Question:   Supervising Provider    Answer:   Sabra Heck, BRIAN [[id number]]     *If you need refills on other medications prior to your next appointment, please contact your pharmacy*  Follow-Up: Call back or seek an in-person evaluation if the symptoms worsen or if the condition fails to improve as anticipated.  Other Instructions Vaginal Probiotics: AZO vaginal probiotic OLLY Happy Hoo-Ha RAW Vaginal Care RenewLife Women's vaginal probiotic RepHresh Pro-B  Vaginal washes: Honey Pot Summer's Eve Vagisil Feminine cleanser  If you  have been instructed to have an in-person evaluation today at a local Urgent Care facility, please use the link below. It will take you to a list of all of our available [facility], including address, phone number and hours of operation. Please do not delay care.  [facility]  If you or a family member do not have a primary care provider, use the link below to schedule a visit and establish care. When you choose a [facility] primary care physician or advanced practice provider, you gain a long-term partner in health. Find a Primary Care Provider  Learn more about 's in-office and virtual care options: [facility]

## 2021-08-09 ENCOUNTER — Emergency Department (HOSPITAL_BASED_OUTPATIENT_CLINIC_OR_DEPARTMENT_OTHER)
Admission: EM | Admit: 2021-08-09 | Discharge: 2021-08-09 | Disposition: A | Discharge status: Home / Self Care | Payer: Medicaid Other | Attending: Emergency Medicine | Admitting: Emergency Medicine

## 2021-08-09 ENCOUNTER — Encounter (HOSPITAL_BASED_OUTPATIENT_CLINIC_OR_DEPARTMENT_OTHER): Payer: Self-pay | Admitting: *Deleted

## 2021-08-09 ENCOUNTER — Other Ambulatory Visit: Payer: Self-pay

## 2021-08-09 DIAGNOSIS — K529 Noninfective gastroenteritis and colitis, unspecified: Secondary | ICD-10-CM | POA: Insufficient documentation

## 2021-08-09 DIAGNOSIS — N9489 Other specified conditions associated with female genital organs and menstrual cycle: Secondary | ICD-10-CM | POA: Diagnosis not present

## 2021-08-09 DIAGNOSIS — Z20822 Contact with and (suspected) exposure to covid-19: Secondary | ICD-10-CM | POA: Insufficient documentation

## 2021-08-09 DIAGNOSIS — Z87891 Personal history of nicotine dependence: Secondary | ICD-10-CM | POA: Diagnosis not present

## 2021-08-09 DIAGNOSIS — J209 Acute bronchitis, unspecified: Secondary | ICD-10-CM | POA: Diagnosis not present

## 2021-08-09 DIAGNOSIS — E119 Type 2 diabetes mellitus without complications: Secondary | ICD-10-CM | POA: Insufficient documentation

## 2021-08-09 DIAGNOSIS — Z7984 Long term (current) use of oral hypoglycemic drugs: Secondary | ICD-10-CM | POA: Diagnosis not present

## 2021-08-09 DIAGNOSIS — R059 Cough, unspecified: Secondary | ICD-10-CM | POA: Diagnosis present

## 2021-08-09 DIAGNOSIS — J9801 Acute bronchospasm: Secondary | ICD-10-CM | POA: Diagnosis not present

## 2021-08-09 LAB — CBC WITH DIFFERENTIAL/PLATELET
Abs Immature Granulocytes: 0.02 10*3/uL (ref 0.00–0.07)
Basophils Absolute: 0 10*3/uL (ref 0.0–0.1)
Basophils Relative: 0 %
Eosinophils Absolute: 0.1 10*3/uL (ref 0.0–0.5)
Eosinophils Relative: 1 %
HCT: 34 % — ABNORMAL LOW (ref 36.0–46.0)
Hemoglobin: 9.7 g/dL — ABNORMAL LOW (ref 12.0–15.0)
Immature Granulocytes: 0 %
Lymphocytes Relative: 30 %
Lymphs Abs: 2.3 10*3/uL (ref 0.7–4.0)
MCH: 20.9 pg — ABNORMAL LOW (ref 26.0–34.0)
MCHC: 28.5 g/dL — ABNORMAL LOW (ref 30.0–36.0)
MCV: 73.3 fL — ABNORMAL LOW (ref 80.0–100.0)
Monocytes Absolute: 0.6 10*3/uL (ref 0.1–1.0)
Monocytes Relative: 7 %
Neutro Abs: 4.7 10*3/uL (ref 1.7–7.7)
Neutrophils Relative %: 62 %
Platelets: 298 10*3/uL (ref 150–400)
RBC: 4.64 MIL/uL (ref 3.87–5.11)
RDW: 19 % — ABNORMAL HIGH (ref 11.5–15.5)
WBC: 7.7 10*3/uL (ref 4.0–10.5)
nRBC: 0 % (ref 0.0–0.2)

## 2021-08-09 LAB — URINALYSIS, ROUTINE W REFLEX MICROSCOPIC
Bilirubin Urine: NEGATIVE
Glucose, UA: NEGATIVE mg/dL
Ketones, ur: NEGATIVE mg/dL
Leukocytes,Ua: NEGATIVE
Nitrite: NEGATIVE
Protein, ur: 30 mg/dL — AB
RBC / HPF: >50 RBC/hpf — ABNORMAL HIGH (ref 0–5)
Specific Gravity, Urine: 1.031 — ABNORMAL HIGH (ref 1.005–1.030)
pH: 6 (ref 5.0–8.0)

## 2021-08-09 LAB — BASIC METABOLIC PANEL
Anion gap: 10 (ref 5–15)
BUN: 13 mg/dL (ref 6–20)
CO2: 25 mmol/L (ref 22–32)
Calcium: 9.4 mg/dL (ref 8.9–10.3)
Chloride: 103 mmol/L (ref 98–111)
Creatinine, Ser: 0.66 mg/dL (ref 0.44–1.00)
GFR, Estimated: >60 mL/min (ref >60)
Glucose, Bld: 154 mg/dL — ABNORMAL HIGH (ref 70–99)
Potassium: 3.8 mmol/L (ref 3.5–5.1)
Sodium: 138 mmol/L (ref 135–145)

## 2021-08-09 LAB — HCG, SERUM, QUALITATIVE: Preg, Serum: NEGATIVE

## 2021-08-09 LAB — RESP PANEL BY RT-PCR (FLU A&B, COVID) ARPGX2
Influenza A by PCR: NEGATIVE
Influenza B by PCR: NEGATIVE
SARS Coronavirus 2 by RT PCR: NEGATIVE

## 2021-08-09 LAB — CBG MONITORING, ED: Glucose-Capillary: 136 mg/dL — ABNORMAL HIGH (ref 70–99)

## 2021-08-09 MED ORDER — ALBUTEROL SULFATE HFA 108 (90 BASE) MCG/ACT IN AERS
2.0000 | INHALATION_SPRAY | RESPIRATORY_TRACT | Status: DC | PRN
  Filled 2021-08-09: qty 6.7

## 2021-08-09 MED ORDER — AEROCHAMBER PLUS FLO-VU LARGE MISC
1.0000 | Freq: Once | Status: AC
  Administered 2021-08-09: 1
  Filled 2021-08-09: qty 1

## 2021-08-09 MED ORDER — LACTATED RINGERS IV BOLUS
1000.0000 mL | Freq: Once | INTRAVENOUS | Status: AC
  Administered 2021-08-09: 1000 mL via INTRAVENOUS

## 2021-08-09 MED ORDER — IPRATROPIUM-ALBUTEROL 0.5-2.5 (3) MG/3ML IN SOLN
3.0000 mL | RESPIRATORY_TRACT | Status: DC
  Administered 2021-08-09: 3 mL via RESPIRATORY_TRACT
  Filled 2021-08-09: qty 3

## 2021-08-09 MED ORDER — METOCLOPRAMIDE HCL 10 MG PO TABS: 10.0000 mg | ORAL_TABLET | Freq: Four times a day (QID) | ORAL | 0 refills | Status: AC | PRN

## 2021-08-09 MED ORDER — ONDANSETRON HCL 4 MG/2ML IJ SOLN
4.0000 mg | Freq: Once | INTRAMUSCULAR | Status: AC
  Administered 2021-08-09: 4 mg via INTRAVENOUS
  Filled 2021-08-09: qty 2

## 2021-08-09 MED ORDER — METOCLOPRAMIDE HCL 5 MG/ML IJ SOLN
10.0000 mg | Freq: Once | INTRAMUSCULAR | Status: AC
  Administered 2021-08-09: 10 mg via INTRAVENOUS
  Filled 2021-08-09: qty 2

## 2021-08-09 NOTE — ED Triage Notes (Addendum)
Onset of vomiting yesterday, vomited x 4 in the past 24 hours, also has had diarrhea. Shortness of breath x 2 day, cough, light yellow mucous production

## 2021-08-09 NOTE — ED Provider Notes (Signed)
[facility] Provider Note: Sandra Spurling, MD, FACEP  CSN: [id number] MRN: [id number] ARRIVAL: 08/09/21 at [place]: [id number]   CHIEF COMPLAINT  Vomiting   HISTORY OF PRESENT ILLNESS  08/09/21 4:24 AM Sandra [name] is a 30 y.o. female who was diagnosed with influenza on 07/13/2021 and states she has not felt back to normal since.  She continues to have a cough and shortness of breath.  Yesterday she developed nausea, vomiting and diarrhea.  She is also having body aches.  She rates her body aches as an 8 out of 10.  Nothing makes her symptoms better or worse.  She describes the cough is productive of yellow mucus.   Past Medical History:  Diagnosis Date   Anemia    Diabetes mellitus without complication (HCC)    GERD (gastroesophageal reflux disease)    Hemorrhage after delivery of fetus    Intraoperative, received blood transfusion   History of blood transfusion    Hx of ectopic pregnancy     Past Surgical History:  Procedure Laterality Date   CESAREAN SECTION     CESAREAN SECTION  04/23/2012   Procedure: CESAREAN SECTION;  Surgeon: Donnamae Jude, MD;  Location: [facility];  Service: Gynecology;  Laterality: N/A;   CHOLECYSTECTOMY  10/23/2012   Procedure: LAPAROSCOPIC CHOLECYSTECTOMY WITH INTRAOPERATIVE CHOLANGIOGRAM;  Surgeon: Pedro Earls, MD;  Location: [facility];  Service: General;  Laterality: N/A;   LAPAROSCOPY FOR ECTOPIC PREGNANCY      Family History  Problem Relation Age of Onset   High blood pressure Mother    Aneurysm Mother    Hypertension Mother    Drug abuse Mother    Diabetes Mellitus II Father    Hyperlipidemia Father    Alcohol abuse Father    Heart disease Maternal Aunt    Diabetes Paternal Grandmother    Diabetes Paternal Grandfather    Other Neg Hx     Social History   Tobacco Use   Smoking status: Former   Smokeless tobacco: Never  Scientific laboratory technician Use: Never used  Substance Use Topics   Alcohol use: Not Currently     Comment: occasionally   Drug use: No    Prior to Admission medications   Medication Sig Start Date End Date Taking? Authorizing Provider  benzonatate (TESSALON) 100 MG capsule Take 1 capsule (100 mg total) by mouth every 8 (eight) hours. 07/13/21  Yes Cardama, Grayce Sessions, MD  glucose blood (ACCU-CHEK GUIDE) test strip Use to check blood sugar once daily or as instructed. 09/19/18  Yes Leeanne Rio, MD  glucose monitoring kit (FREESTYLE) monitoring kit 1 each by Does not apply route as needed for other. 04/11/19  Yes Meccariello, Bernita Raisin, DO  Lancets Misc. (ACCU-CHEK FASTCLIX LANCET) KIT 1 Piece by Does not apply route 2 (two) times daily. 04/14/19  Yes Meccariello, Bernita Raisin, DO  metFORMIN (GLUCOPHAGE) 1000 MG tablet TAKE 1 TABLET (1,000 MG TOTAL) BY MOUTH 2 (TWO) TIMES DAILY WITH A MEAL. 12/20/20  Yes Meccariello, Bernita Raisin, DO  metoCLOPramide (REGLAN) 10 MG tablet Take 1 tablet (10 mg total) by mouth every 6 (six) hours as needed for nausea (nausea/headache). 08/09/21  Yes Mehek Grega, MD  sertraline (ZOLOFT) 25 MG tablet Take 1 tablet (25 mg total) by mouth daily. 09/06/20  Yes Meccariello, Bernita Raisin, DO  vitamin B-12 (CYANOCOBALAMIN) 500 MCG tablet Take 1 tablet (500 mcg total) by mouth daily. 09/04/19  Yes Meccariello, Bernita Raisin, DO  diclofenac Sodium (  VOLTAREN) 1 % GEL Apply 2 g topically 4 (four) times daily. 08/05/20   Benay Pike, MD    Allergies Shellfish allergy   REVIEW OF SYSTEMS  Negative except as noted here or in the History of Present Illness.   PHYSICAL EXAMINATION  Initial Vital Signs Blood pressure (!) 149/88, pulse (!) 104, temperature 99.1 F (37.3 C), temperature source Oral, resp. rate (!) 22, height '4\' 11"'  (1.499 m), weight 116.1 kg, last menstrual period 08/03/2021, SpO2 99 %.  Examination General: Well-developed, high BMI female in no acute distress; appearance consistent with age of record HENT: normocephalic; atraumatic Eyes: Normal  appearance Neck: supple Heart: regular rate and rhythm Lungs: Decreased air movement bilaterally without wheezing Abdomen: soft; nondistended; nontender; bowel sounds present Extremities: No deformity; full range of motion; pulses normal Neurologic: Awake, alert and oriented; motor function intact in all extremities and symmetric; no facial droop Skin: Warm and dry Psychiatric: Flat affect   RESULTS  Summary of this visit's results, reviewed and interpreted by myself:   EKG Interpretation  Date/Time:    Ventricular Rate:    PR Interval:    QRS Duration:   QT Interval:    QTC Calculation:   R Axis:     Text Interpretation:         Laboratory Studies: Results for orders placed or performed during the hospital encounter of 08/09/21 (from the past 24 hour(s))  CBG monitoring, ED     Status: Abnormal   Collection Time: 08/09/21  4:27 AM  Result Value Ref Range   Glucose-Capillary 136 (H) 70 - 99 mg/dL  CBC with Differential/Platelet     Status: Abnormal   Collection Time: 08/09/21  4:39 AM  Result Value Ref Range   WBC 7.7 4.0 - 10.5 K/uL   RBC 4.64 3.87 - 5.11 MIL/uL   Hemoglobin 9.7 (L) 12.0 - 15.0 g/dL   HCT 34.0 (L) 36.0 - 46.0 %   MCV 73.3 (L) 80.0 - 100.0 fL   MCH 20.9 (L) 26.0 - 34.0 pg   MCHC 28.5 (L) 30.0 - 36.0 g/dL   RDW 19.0 (H) 11.5 - 15.5 %   Platelets 298 150 - 400 K/uL   nRBC 0.0 0.0 - 0.2 %   Neutrophils Relative % 62 %   Neutro Abs 4.7 1.7 - 7.7 K/uL   Lymphocytes Relative 30 %   Lymphs Abs 2.3 0.7 - 4.0 K/uL   Monocytes Relative 7 %   Monocytes Absolute 0.6 0.1 - 1.0 K/uL   Eosinophils Relative 1 %   Eosinophils Absolute 0.1 0.0 - 0.5 K/uL   Basophils Relative 0 %   Basophils Absolute 0.0 0.0 - 0.1 K/uL   Immature Granulocytes 0 %   Abs Immature Granulocytes 0.02 0.00 - 0.07 K/uL  Basic metabolic panel     Status: Abnormal   Collection Time: 08/09/21  4:39 AM  Result Value Ref Range   Sodium 138 135 - 145 mmol/L   Potassium 3.8 3.5 - 5.1  mmol/L   Chloride 103 98 - 111 mmol/L   CO2 25 22 - 32 mmol/L   Glucose, Bld 154 (H) 70 - 99 mg/dL   BUN 13 6 - 20 mg/dL   Creatinine, Ser 0.66 0.44 - 1.00 mg/dL   Calcium 9.4 8.9 - 10.3 mg/dL   GFR, Estimated >60 >60 mL/min   Anion gap 10 5 - 15  hCG, serum, qualitative     Status: None   Collection Time: 08/09/21  4:39 AM  Result Value Ref Range   Preg, Serum NEGATIVE NEGATIVE  Resp Panel by RT-PCR (Flu A&B, Covid) Nasopharyngeal Swab     Status: None   Collection Time: 08/09/21  4:42 AM   Specimen: Nasopharyngeal Swab; Nasopharyngeal(NP) swabs in vial transport medium  Result Value Ref Range   SARS Coronavirus 2 by RT PCR NEGATIVE NEGATIVE   Influenza A by PCR NEGATIVE NEGATIVE   Influenza B by PCR NEGATIVE NEGATIVE  Urinalysis, Routine w reflex microscopic     Status: Abnormal   Collection Time: 08/09/21  4:50 AM  Result Value Ref Range   Color, Urine YELLOW YELLOW   APPearance HAZY (A) CLEAR   Specific Gravity, Urine 1.031 (H) 1.005 - 1.030   pH 6.0 5.0 - 8.0   Glucose, UA NEGATIVE NEGATIVE mg/dL   Hgb urine dipstick LARGE (A) NEGATIVE   Bilirubin Urine NEGATIVE NEGATIVE   Ketones, ur NEGATIVE NEGATIVE mg/dL   Protein, ur 30 (A) NEGATIVE mg/dL   Nitrite NEGATIVE NEGATIVE   Leukocytes,Ua NEGATIVE NEGATIVE   RBC / HPF >50 (H) 0 - 5 RBC/hpf   WBC, UA 11-20 0 - 5 WBC/hpf   Bacteria, UA FEW (A) NONE SEEN   Squamous Epithelial / LPF 0-5 0 - 5   Mucus PRESENT    Ca Oxalate Crys, UA PRESENT    Imaging Studies: No results found.  ED COURSE and MDM  Nursing notes, initial and subsequent vitals signs, including pulse oximetry, reviewed and interpreted by myself.  Vitals:   08/09/21 0456 08/09/21 0526 08/09/21 0530 08/09/21 0545  BP:  106/87 127/77 104/79  Pulse:  93 91 86  Resp:  18    Temp:      TempSrc:      SpO2: 100% 100% 100% 99%  Weight:      Height:       Medications  ipratropium-albuterol (DUONEB) 0.5-2.5 (3) MG/3ML nebulizer solution 3 mL (3 mLs  Nebulization Given 08/09/21 0454)  AeroChamber Plus Flo-Vu Large MISC 1 each (has no administration in time range)  albuterol (VENTOLIN HFA) 108 (90 Base) MCG/ACT inhaler 2 puff (has no administration in time range)  lactated ringers bolus 1,000 mL (1,000 mLs Intravenous New Bag/Given 08/09/21 0452)  ondansetron (ZOFRAN) injection 4 mg (4 mg Intravenous Given 08/09/21 0440)  metoCLOPramide (REGLAN) injection 10 mg (10 mg Intravenous Given 08/09/21 0532)   5:29 AM Air movement and dyspnea significantly improved with DuoNeb treatment.  Will provide albuterol inhaler and AeroChamber.  Nausea persists, will add Reglan.  6:04 AM Nausea improved after Reglan.  Patient given albuterol inhaler and AeroChamber.  Bronchospasm may be left over from her recent influenza or could represent a different viral infection.  The GI symptoms could be due to a new viral infection as well.   PROCEDURES  Procedures   ED DIAGNOSES     ICD-10-CM   1. Acute gastroenteritis  K52.9     2. Acute bronchitis with bronchospasm  J20.9          Larone Kliethermes, Jenny Reichmann, MD 08/09/21 [phone]

## 2021-08-09 NOTE — ED Notes (Signed)
Discharge instructions including prescriptions discussed with pt. Pt verbalized understanding with no questions at this time.

## 2021-08-09 NOTE — ED Notes (Signed)
Patient given Aerochamber with inhaler. Pt demonstrated knowledge of how to use Aerochamber with no difficulties.

## 2021-08-18 DIAGNOSIS — Z419 Encounter for procedure for purposes other than remedying health state, unspecified: Secondary | ICD-10-CM | POA: Diagnosis not present

## 2021-08-26 ENCOUNTER — Encounter: Payer: Medicaid Other | Admitting: Family Medicine

## 2021-09-10 NOTE — Patient Instructions (Incomplete)
It was wonderful to see you today.  Please bring ALL of your medications with you to every visit.   Today we talked about:  -Your hemoglobin A1c was ***.  -We are doing lab work today to check if your kidneys are spilling protein which can happen with diabetes. I will send you a MyChart message if you have MyChart. Otherwise, I will give you a call for abnormal results or send a letter if everything returned back normal. If you don't hear from me in 2 weeks, please call the office.    Today at your annual preventive visit we talked about the following measures: I recommend 150 minutes of exercise per week-try 30 minutes 5 days per week We discussed reducing sugary beverages (like soda and juice) and increasing leafy greens and whole fruits.  We discussed avoiding tobacco and alcohol.  I recommend avoiding illicit substances.  Your blood pressure is *** at goal of ***.   Thank you for choosing [facility].   Please call [phone] with any questions about today's appointment.  Please be sure to schedule follow up at the front  desk before you leave today.   Sabino Dick, DO PGY-2 Family Medicine
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
## 2021-09-13 ENCOUNTER — Encounter: Payer: Medicaid Other | Admitting: Family Medicine

## 2021-09-13 NOTE — Patient Instructions (Incomplete)
It was wonderful to see you today.  Please bring ALL of your medications with you to every visit.   Today we talked about:  -Your hemoglobin A1c was ***. The goal is to get this below 7. -Medication changes: *** -You received flu and pneumonia vaccines today. ***  Today at your annual preventive visit we talked about the following measures: I recommend 150 minutes of exercise per week-try 30 minutes 5 days per week We discussed reducing sugary beverages (like soda and juice) and increasing leafy greens and whole fruits.  We discussed avoiding tobacco and alcohol.  I recommend avoiding illicit substances.  Your blood pressure is *** at goal of ***.    Diet Recommendations for Diabetes  Carbohydrate includes starch, sugar, and fiber.  Of these, only sugar and starch raise blood glucose.  (Fiber is found in fruits, vegetables [especially skin, seeds, and stalks], whole grains, and beans.)   Starchy (carb) foods: Bread, rice, pasta, potatoes, corn, cereal, grits, crackers, bagels, muffins, all baked goods.  (Fruit, milk, and yogurt also have carbohydrate, but most of these foods will not spike your blood sugar as most starchy or sweet foods will.)  A few fruits do cause high blood sugars; use small portions of bananas (limit to 1/2 at a time), grapes, watermelon, and oranges.   Protein foods: Meat, fish, poultry, eggs, dairy foods, and beans such as pinto and kidney beans (beans also provide carbohydrate).   1. Eat at least 3 REAL meals and 1-2 snacks per day. Eat breakfast within the first hour of getting up.  Have something to eat at least every 5 hours while awake.   2. Limit starchy foods to TWO per meal and ONE per snack. ONE portion of a starchy food is equal to the following:   - ONE slice of bread (or its equivalent, such as half of a hamburger bun).   - 1/2 cup of a "scoopable" starchy food such as potatoes or rice.   - 15 grams of Total Carbohydrate as shown on food label.   - Every  4 ounces of a sweet drink (including fruit juice). 3. Include twice the volume of vegetables as protein or carbohydrate foods for BOTH lunch and dinner as often as you can.   - Fresh or frozen vegetables are best.   - Keep frozen vegetables on hand for a quick option.         Thank you for choosing [facility].   Please call [phone] with any questions about today's appointment.  Please be sure to schedule follow up at the front  desk before you leave today.   Sabino Dick, DO PGY-2 Family Medicine
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
## 2021-09-14 ENCOUNTER — Encounter: Payer: Medicaid Other | Admitting: Family Medicine

## 2021-09-18 DIAGNOSIS — Z419 Encounter for procedure for purposes other than remedying health state, unspecified: Secondary | ICD-10-CM | POA: Diagnosis not present

## 2021-10-08 ENCOUNTER — Telehealth: Payer: BC Managed Care – PPO | Admitting: Nurse Practitioner

## 2021-10-08 DIAGNOSIS — K219 Gastro-esophageal reflux disease without esophagitis: Secondary | ICD-10-CM

## 2021-10-08 MED ORDER — OMEPRAZOLE 20 MG PO CPDR: 20.0000 mg | DELAYED_RELEASE_CAPSULE | Freq: Every day | ORAL | 3 refills | Status: AC

## 2021-10-08 NOTE — Progress Notes (Signed)
E-Visit for Heartburn  We are sorry that you are not feeling well.  Here is how we plan to help!  Based on what you shared with me it looks like you most likely have Gastroesophageal Reflux Disease (GERD)  Gastroesophageal reflux disease (GERD) happens when acid from your stomach flows up into the esophagus.  When acid comes in contact with the esophagus, the acid causes sorenss (inflammation) in the esophagus.  Over time, GERD may create small holes (ulcers) in the lining of the esophagus.  I have prescribed Omeprazole 20 mg one by mouth daily until you follow up with a provider. We do not prescribed th liquid you had in an evisit.  Your symptoms should improve in the next day or two.  You can use antacids as needed until symptoms resolve.  Call us if your heartburn worsens, you have trouble swallowing, weight loss, spitting up blood or recurrent vomiting.  Home Care: May include lifestyle changes such as weight loss, quitting smoking and alcohol consumption Avoid foods and drinks that make your symptoms worse, such as: Caffeine or alcoholic drinks Chocolate Peppermint or mint flavorings Garlic and onions Spicy foods Citrus fruits, such as oranges, lemons, or limes Tomato-based foods such as sauce, chili, salsa and pizza Fried and fatty foods Avoid lying down for 3 hours prior to your bedtime or prior to taking a nap Eat small, frequent meals instead of a large meals Wear loose-fitting clothing.  Do not wear anything tight around your waist that causes pressure on your stomach. Raise the head of your bed 6 to 8 inches with wood blocks to help you sleep.  Extra pillows will not help.  Seek Help Right Away If: You have pain in your arms, neck, jaw, teeth or back Your pain increases or changes in intensity or duration You develop nausea, vomiting or sweating (diaphoresis) You develop shortness of breath or you faint Your vomit is green, yellow, black or looks like coffee grounds or  blood Your stool is red, bloody or black  These symptoms could be signs of other problems, such as heart disease, gastric bleeding or esophageal bleeding.  Make sure you : Understand these instructions. Will watch your condition. Will get help right away if you are not doing well or get worse.  Your e-visit answers were reviewed by a board certified advanced clinical practitioner to complete your personal care plan.  Depending on the condition, your plan could have included both over the counter or prescription medications.  If there is a problem please reply  once you have received a response from your provider.  Your safety is important to [place].  If you have drug allergies check your prescription carefully.    You can use MyChart to ask questions about todays visit, request a non-urgent call back, or ask for a work or school excuse for 24 hours related to this e-Visit. If it has been greater than 24 hours you will need to follow up with your provider, or enter a new e-Visit to address those concerns.  You will get an e-mail in the next two days asking about your experience.  I hope that your e-visit has been valuable and will speed your recovery. Thank you for using e-visits.  5-10 minutes spent reviewing and documenting in chart.

## 2021-10-19 DIAGNOSIS — Z419 Encounter for procedure for purposes other than remedying health state, unspecified: Secondary | ICD-10-CM | POA: Diagnosis not present

## 2021-10-27 DIAGNOSIS — Z0000 Encounter for general adult medical examination without abnormal findings: Secondary | ICD-10-CM | POA: Insufficient documentation
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
## 2021-10-28 ENCOUNTER — Encounter: Payer: Medicaid Other | Admitting: Student

## 2021-10-28 DIAGNOSIS — Z0000 Encounter for general adult medical examination without abnormal findings: Secondary | ICD-10-CM

## 2021-10-28 DIAGNOSIS — E119 Type 2 diabetes mellitus without complications: Secondary | ICD-10-CM

## 2021-11-16 DIAGNOSIS — Z419 Encounter for procedure for purposes other than remedying health state, unspecified: Secondary | ICD-10-CM | POA: Diagnosis not present

## 2021-12-09 ENCOUNTER — Encounter: Payer: Self-pay | Admitting: Student

## 2021-12-17 DIAGNOSIS — Z419 Encounter for procedure for purposes other than remedying health state, unspecified: Secondary | ICD-10-CM | POA: Diagnosis not present

## 2022-01-16 DIAGNOSIS — Z419 Encounter for procedure for purposes other than remedying health state, unspecified: Secondary | ICD-10-CM | POA: Diagnosis not present

## 2022-02-03 ENCOUNTER — Encounter: Payer: Self-pay | Admitting: Family Medicine

## 2022-02-16 ENCOUNTER — Other Ambulatory Visit: Payer: Self-pay

## 2022-02-16 DIAGNOSIS — Z419 Encounter for procedure for purposes other than remedying health state, unspecified: Secondary | ICD-10-CM | POA: Diagnosis not present

## 2022-02-16 MED ORDER — METFORMIN HCL 1000 MG PO TABS: 1000.0000 mg | ORAL_TABLET | Freq: Two times a day (BID) | ORAL | 0 refills | Status: AC

## 2022-02-21 ENCOUNTER — Encounter: Payer: Self-pay | Admitting: *Deleted
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
## 2022-03-06 ENCOUNTER — Encounter: Payer: Self-pay | Admitting: Family Medicine

## 2022-03-06 DIAGNOSIS — E119 Type 2 diabetes mellitus without complications: Secondary | ICD-10-CM

## 2022-03-06 DIAGNOSIS — Z124 Encounter for screening for malignant neoplasm of cervix: Secondary | ICD-10-CM

## 2022-03-06 DIAGNOSIS — Z0000 Encounter for general adult medical examination without abnormal findings: Secondary | ICD-10-CM

## 2022-03-18 DIAGNOSIS — Z419 Encounter for procedure for purposes other than remedying health state, unspecified: Secondary | ICD-10-CM | POA: Diagnosis not present

## 2022-04-18 DIAGNOSIS — Z419 Encounter for procedure for purposes other than remedying health state, unspecified: Secondary | ICD-10-CM | POA: Diagnosis not present

## 2022-05-19 DIAGNOSIS — Z419 Encounter for procedure for purposes other than remedying health state, unspecified: Secondary | ICD-10-CM | POA: Diagnosis not present

## 2022-06-18 DIAGNOSIS — Z419 Encounter for procedure for purposes other than remedying health state, unspecified: Secondary | ICD-10-CM | POA: Diagnosis not present

## 2022-07-19 DIAGNOSIS — Z419 Encounter for procedure for purposes other than remedying health state, unspecified: Secondary | ICD-10-CM | POA: Diagnosis not present

## 2022-08-18 DIAGNOSIS — Z419 Encounter for procedure for purposes other than remedying health state, unspecified: Secondary | ICD-10-CM | POA: Diagnosis not present

## 2022-09-18 DIAGNOSIS — Z419 Encounter for procedure for purposes other than remedying health state, unspecified: Secondary | ICD-10-CM | POA: Diagnosis not present

## 2022-10-19 DIAGNOSIS — Z419 Encounter for procedure for purposes other than remedying health state, unspecified: Secondary | ICD-10-CM | POA: Diagnosis not present

## 2022-11-17 DIAGNOSIS — Z419 Encounter for procedure for purposes other than remedying health state, unspecified: Secondary | ICD-10-CM | POA: Diagnosis not present

## 2022-12-29 ENCOUNTER — Telehealth: Payer: Self-pay | Admitting: Family Medicine

## 2022-12-29 NOTE — Telephone Encounter (Signed)
Outreached to patient to schedule apt with PCP to address HTN, Mood and DM. LVM. AS, CMA
